# Patient Record
Sex: Male | Born: 1960
Health system: Southern US, Community
[De-identification: ages and names within clinical notes are randomized; demographics above are authoritative.]

## PROBLEM LIST (undated history)

## (undated) DIAGNOSIS — K635 Polyp of colon: Secondary | ICD-10-CM

## (undated) DIAGNOSIS — K219 Gastro-esophageal reflux disease without esophagitis: Secondary | ICD-10-CM

## (undated) DIAGNOSIS — E785 Hyperlipidemia, unspecified: Secondary | ICD-10-CM

## (undated) DIAGNOSIS — M1990 Unspecified osteoarthritis, unspecified site: Secondary | ICD-10-CM

## (undated) DIAGNOSIS — N4340 Spermatocele of epididymis, unspecified: Secondary | ICD-10-CM

## (undated) DIAGNOSIS — T7840XA Allergy, unspecified, initial encounter: Secondary | ICD-10-CM

## (undated) DIAGNOSIS — K222 Esophageal obstruction: Secondary | ICD-10-CM

## (undated) DIAGNOSIS — K5790 Diverticulosis of intestine, part unspecified, without perforation or abscess without bleeding: Secondary | ICD-10-CM

## (undated) DIAGNOSIS — N400 Enlarged prostate without lower urinary tract symptoms: Secondary | ICD-10-CM

## (undated) DIAGNOSIS — R011 Cardiac murmur, unspecified: Secondary | ICD-10-CM

## (undated) DIAGNOSIS — E119 Type 2 diabetes mellitus without complications: Secondary | ICD-10-CM

## (undated) DIAGNOSIS — G510 Bell's palsy: Secondary | ICD-10-CM

## (undated) DIAGNOSIS — K648 Other hemorrhoids: Secondary | ICD-10-CM

## (undated) DIAGNOSIS — G4730 Sleep apnea, unspecified: Secondary | ICD-10-CM

## (undated) HISTORY — DX: Bell's palsy: G51.0

## (undated) HISTORY — DX: Cardiac murmur, unspecified: R01.1

## (undated) HISTORY — DX: Allergy, unspecified, initial encounter: T78.40XA

## (undated) HISTORY — PX: UPPER GASTROINTESTINAL ENDOSCOPY: SHX188

## (undated) HISTORY — PX: COLONOSCOPY: SHX174

## (undated) HISTORY — DX: Unspecified osteoarthritis, unspecified site: M19.90

## (undated) HISTORY — DX: Benign prostatic hyperplasia without lower urinary tract symptoms: N40.0

## (undated) HISTORY — DX: Diverticulosis of intestine, part unspecified, without perforation or abscess without bleeding: K57.90

## (undated) HISTORY — DX: Hyperlipidemia, unspecified: E78.5

## (undated) HISTORY — DX: Spermatocele of epididymis, unspecified: N43.40

## (undated) HISTORY — DX: Esophageal obstruction: K22.2

## (undated) HISTORY — DX: Polyp of colon: K63.5

## (undated) HISTORY — DX: Other hemorrhoids: K64.8

## (undated) HISTORY — DX: Gastro-esophageal reflux disease without esophagitis: K21.9

## (undated) HISTORY — DX: Type 2 diabetes mellitus without complications: E11.9

## (undated) HISTORY — DX: Sleep apnea, unspecified: G47.30

---

## 1988-06-20 HISTORY — PX: CYSTECTOMY: SUR359

## 1989-04-20 DIAGNOSIS — E785 Hyperlipidemia, unspecified: Secondary | ICD-10-CM

## 1989-04-20 HISTORY — DX: Hyperlipidemia, unspecified: E78.5

## 1994-12-19 HISTORY — PX: OTHER SURGICAL HISTORY: SHX169

## 1997-07-21 DIAGNOSIS — N400 Enlarged prostate without lower urinary tract symptoms: Secondary | ICD-10-CM

## 1997-07-21 HISTORY — DX: Benign prostatic hyperplasia without lower urinary tract symptoms: N40.0

## 1997-08-05 ENCOUNTER — Encounter: Payer: Self-pay | Admitting: Family Medicine

## 1997-08-05 LAB — CONVERTED CEMR LAB: PSA: 0.6 ng/mL

## 2001-02-14 ENCOUNTER — Encounter: Payer: Self-pay | Admitting: Family Medicine

## 2002-11-19 DIAGNOSIS — K219 Gastro-esophageal reflux disease without esophagitis: Secondary | ICD-10-CM

## 2002-11-19 HISTORY — DX: Gastro-esophageal reflux disease without esophagitis: K21.9

## 2002-12-27 ENCOUNTER — Encounter: Payer: Self-pay | Admitting: Family Medicine

## 2003-01-19 DIAGNOSIS — E119 Type 2 diabetes mellitus without complications: Secondary | ICD-10-CM

## 2003-01-19 HISTORY — DX: Type 2 diabetes mellitus without complications: E11.9

## 2003-01-27 ENCOUNTER — Encounter: Payer: Self-pay | Admitting: Family Medicine

## 2003-12-30 ENCOUNTER — Encounter: Payer: Self-pay | Admitting: Family Medicine

## 2004-01-02 ENCOUNTER — Observation Stay (HOSPITAL_COMMUNITY): Admission: RE | Admit: 2004-01-02 | Discharge: 2004-01-03 | Payer: Self-pay | Admitting: General Surgery

## 2004-01-02 ENCOUNTER — Encounter (INDEPENDENT_AMBULATORY_CARE_PROVIDER_SITE_OTHER): Payer: Self-pay | Admitting: Specialist

## 2004-01-05 HISTORY — PX: CHOLECYSTECTOMY: SHX55

## 2004-01-13 HISTORY — PX: DOPPLER ECHOCARDIOGRAPHY: SHX263

## 2004-06-29 ENCOUNTER — Ambulatory Visit: Payer: Self-pay | Admitting: Family Medicine

## 2004-07-01 ENCOUNTER — Ambulatory Visit: Payer: Self-pay | Admitting: Family Medicine

## 2004-11-30 ENCOUNTER — Ambulatory Visit: Payer: Self-pay | Admitting: Internal Medicine

## 2004-12-28 ENCOUNTER — Ambulatory Visit: Payer: Self-pay | Admitting: Family Medicine

## 2004-12-28 LAB — CONVERTED CEMR LAB
Hgb A1c MFr Bld: 6.1 %
Microalbumin U total vol: 2.6 mg/L

## 2004-12-30 ENCOUNTER — Ambulatory Visit: Payer: Self-pay | Admitting: Family Medicine

## 2005-05-09 ENCOUNTER — Ambulatory Visit: Payer: Self-pay | Admitting: Family Medicine

## 2005-11-29 ENCOUNTER — Ambulatory Visit: Payer: Self-pay | Admitting: Internal Medicine

## 2006-04-17 ENCOUNTER — Ambulatory Visit: Payer: Self-pay | Admitting: Family Medicine

## 2006-04-17 LAB — CONVERTED CEMR LAB
Hgb A1c MFr Bld: 6.3 %
Microalbumin U total vol: 4.3 mg/L

## 2006-04-26 ENCOUNTER — Ambulatory Visit: Payer: Self-pay | Admitting: Family Medicine

## 2006-05-01 ENCOUNTER — Ambulatory Visit: Payer: Self-pay | Admitting: Family Medicine

## 2006-05-19 ENCOUNTER — Ambulatory Visit: Payer: Self-pay | Admitting: Family Medicine

## 2006-05-26 ENCOUNTER — Ambulatory Visit: Payer: Self-pay | Admitting: Family Medicine

## 2006-05-26 ENCOUNTER — Encounter (INDEPENDENT_AMBULATORY_CARE_PROVIDER_SITE_OTHER): Payer: Self-pay | Admitting: Specialist

## 2006-05-26 HISTORY — PX: VASECTOMY: SHX75

## 2006-06-07 ENCOUNTER — Ambulatory Visit: Payer: Self-pay | Admitting: Family Medicine

## 2006-07-25 ENCOUNTER — Ambulatory Visit: Payer: Self-pay | Admitting: Family Medicine

## 2006-07-25 LAB — CONVERTED CEMR LAB
ALT: 37 units/L (ref 0–40)
AST: 30 units/L (ref 0–37)
Cholesterol: 127 mg/dL (ref 0–200)
HDL: 27 mg/dL — ABNORMAL LOW (ref >39.0)
Hgb A1c MFr Bld: 6.3 %
Hgb A1c MFr Bld: 6.3 % — ABNORMAL HIGH (ref 4.6–6.0)
LDL Cholesterol: 81 mg/dL (ref 0–99)
Total CHOL/HDL Ratio: 4.7
Triglycerides: 95 mg/dL (ref 0–149)
VLDL: 19 mg/dL (ref 0–40)

## 2006-07-27 ENCOUNTER — Ambulatory Visit: Payer: Self-pay | Admitting: Family Medicine

## 2006-10-03 ENCOUNTER — Ambulatory Visit: Payer: Self-pay | Admitting: Family Medicine

## 2006-11-20 ENCOUNTER — Encounter: Payer: Self-pay | Admitting: Family Medicine

## 2006-11-20 DIAGNOSIS — G4733 Obstructive sleep apnea (adult) (pediatric): Secondary | ICD-10-CM | POA: Insufficient documentation

## 2006-11-20 DIAGNOSIS — K219 Gastro-esophageal reflux disease without esophagitis: Secondary | ICD-10-CM | POA: Insufficient documentation

## 2006-11-20 DIAGNOSIS — R011 Cardiac murmur, unspecified: Secondary | ICD-10-CM

## 2006-11-20 DIAGNOSIS — N400 Enlarged prostate without lower urinary tract symptoms: Secondary | ICD-10-CM

## 2006-11-20 DIAGNOSIS — N4340 Spermatocele of epididymis, unspecified: Secondary | ICD-10-CM

## 2006-11-20 DIAGNOSIS — E119 Type 2 diabetes mellitus without complications: Secondary | ICD-10-CM | POA: Insufficient documentation

## 2006-11-20 DIAGNOSIS — E1129 Type 2 diabetes mellitus with other diabetic kidney complication: Secondary | ICD-10-CM | POA: Insufficient documentation

## 2006-11-20 DIAGNOSIS — E785 Hyperlipidemia, unspecified: Secondary | ICD-10-CM | POA: Insufficient documentation

## 2006-11-21 ENCOUNTER — Ambulatory Visit: Payer: Self-pay | Admitting: Family Medicine

## 2007-03-13 ENCOUNTER — Ambulatory Visit: Payer: Self-pay | Admitting: Internal Medicine

## 2007-05-11 ENCOUNTER — Ambulatory Visit: Payer: Self-pay | Admitting: Family Medicine

## 2007-05-12 LAB — CONVERTED CEMR LAB
Chloride: 104 meq/L (ref 96–112)
Cholesterol: 141 mg/dL (ref 0–200)
GFR calc Af Amer: 92 mL/min
GFR calc non Af Amer: 76 mL/min
Glucose, Bld: 159 mg/dL — ABNORMAL HIGH (ref 70–99)
HDL: 22.4 mg/dL — ABNORMAL LOW (ref >39.0)
Hgb A1c MFr Bld: 6.5 % — ABNORMAL HIGH (ref 4.6–6.0)
LDL Cholesterol: 89 mg/dL (ref 0–99)
PSA: 1.09 ng/mL (ref 0.10–4.00)
Potassium: 4.7 meq/L (ref 3.5–5.1)
Sodium: 140 meq/L (ref 135–145)
TSH: 2.31 microintl units/mL (ref 0.35–5.50)
Total CHOL/HDL Ratio: 6.3
Triglycerides: 149 mg/dL (ref 0–149)

## 2007-05-15 ENCOUNTER — Ambulatory Visit: Payer: Self-pay | Admitting: Family Medicine

## 2007-11-09 ENCOUNTER — Ambulatory Visit: Payer: Self-pay | Admitting: Family Medicine

## 2007-11-09 LAB — CONVERTED CEMR LAB: Hgb A1c MFr Bld: 6.8 % — ABNORMAL HIGH (ref 4.6–6.0)

## 2007-11-13 ENCOUNTER — Ambulatory Visit: Payer: Self-pay | Admitting: Family Medicine

## 2008-03-11 ENCOUNTER — Ambulatory Visit: Payer: Self-pay | Admitting: Internal Medicine

## 2008-04-30 ENCOUNTER — Ambulatory Visit: Payer: Self-pay | Admitting: Family Medicine

## 2008-04-30 LAB — CONVERTED CEMR LAB
Albumin: 3.9 g/dL (ref 3.5–5.2)
BUN: 13 mg/dL (ref 6–23)
Calcium: 9 mg/dL (ref 8.4–10.5)
Creatinine,U: 296 mg/dL
Eosinophils Relative: 2.1 % (ref 0.0–5.0)
GFR calc Af Amer: 83 mL/min
Glucose, Bld: 170 mg/dL — ABNORMAL HIGH (ref 70–99)
HCT: 44.1 % (ref 39.0–52.0)
Hemoglobin: 15 g/dL (ref 13.0–17.0)
MCV: 88.2 fL (ref 78.0–100.0)
Microalb Creat Ratio: 9.8 mg/g (ref 0.0–30.0)
Microalb, Ur: 2.9 mg/dL — ABNORMAL HIGH (ref 0.0–1.9)
Monocytes Absolute: 0.3 10*3/uL (ref 0.1–1.0)
Monocytes Relative: 6.2 % (ref 3.0–12.0)
Neutro Abs: 3.3 10*3/uL (ref 1.4–7.7)
RDW: 12.3 % (ref 11.5–14.6)
Sodium: 139 meq/L (ref 135–145)
Total CHOL/HDL Ratio: 6.3
Total Protein: 6.7 g/dL (ref 6.0–8.3)
Triglycerides: 96 mg/dL (ref 0–149)

## 2008-05-19 ENCOUNTER — Ambulatory Visit: Payer: Self-pay | Admitting: Family Medicine

## 2008-08-15 ENCOUNTER — Ambulatory Visit: Payer: Self-pay | Admitting: Family Medicine

## 2008-08-25 ENCOUNTER — Ambulatory Visit: Payer: Self-pay | Admitting: Family Medicine

## 2008-08-25 DIAGNOSIS — H8110 Benign paroxysmal vertigo, unspecified ear: Secondary | ICD-10-CM

## 2008-10-07 ENCOUNTER — Encounter: Payer: Self-pay | Admitting: Internal Medicine

## 2009-01-28 ENCOUNTER — Ambulatory Visit: Payer: Self-pay | Admitting: Family Medicine

## 2009-02-02 ENCOUNTER — Ambulatory Visit: Payer: Self-pay | Admitting: Family Medicine

## 2009-03-10 ENCOUNTER — Ambulatory Visit: Payer: Self-pay | Admitting: Internal Medicine

## 2009-04-06 ENCOUNTER — Telehealth: Payer: Self-pay | Admitting: Family Medicine

## 2009-06-17 ENCOUNTER — Encounter: Payer: Self-pay | Admitting: Family Medicine

## 2009-06-17 LAB — CONVERTED CEMR LAB
Bilirubin, Direct: 0.1 mg/dL (ref 0.0–0.3)
CO2: 28 meq/L (ref 19–32)
Calcium: 9.2 mg/dL (ref 8.4–10.5)
Creatinine, Ser: 1 mg/dL (ref 0.4–1.5)
HDL: 24.2 mg/dL — ABNORMAL LOW (ref >39.00)
Hgb A1c MFr Bld: 7.1 % — ABNORMAL HIGH (ref 4.6–6.5)
LDL Cholesterol: 92 mg/dL (ref 0–99)
Microalb, Ur: 2.3 mg/dL — ABNORMAL HIGH (ref 0.0–1.9)
PSA: 1.58 ng/mL (ref 0.10–4.00)
Total Bilirubin: 0.7 mg/dL (ref 0.3–1.2)
Total CHOL/HDL Ratio: 6
Total Protein: 6.9 g/dL (ref 6.0–8.3)
Triglycerides: 134 mg/dL (ref 0.0–149.0)

## 2009-06-22 ENCOUNTER — Ambulatory Visit: Payer: Self-pay | Admitting: Family Medicine

## 2009-07-10 ENCOUNTER — Ambulatory Visit: Payer: Self-pay | Admitting: Family Medicine

## 2009-07-13 ENCOUNTER — Encounter (INDEPENDENT_AMBULATORY_CARE_PROVIDER_SITE_OTHER): Payer: Self-pay | Admitting: *Deleted

## 2009-12-09 ENCOUNTER — Ambulatory Visit: Payer: Self-pay | Admitting: Family Medicine

## 2009-12-09 LAB — CONVERTED CEMR LAB: Hgb A1c MFr Bld: 7.5 % — ABNORMAL HIGH (ref 4.6–6.5)

## 2009-12-14 ENCOUNTER — Ambulatory Visit: Payer: Self-pay | Admitting: Family Medicine

## 2010-01-26 ENCOUNTER — Encounter (INDEPENDENT_AMBULATORY_CARE_PROVIDER_SITE_OTHER): Payer: Self-pay | Admitting: *Deleted

## 2010-03-10 ENCOUNTER — Telehealth: Payer: Self-pay | Admitting: Internal Medicine

## 2010-04-02 ENCOUNTER — Ambulatory Visit: Payer: Self-pay | Admitting: Internal Medicine

## 2010-06-28 ENCOUNTER — Telehealth: Payer: Self-pay | Admitting: Internal Medicine

## 2010-06-29 ENCOUNTER — Ambulatory Visit
Admission: RE | Admit: 2010-06-29 | Discharge: 2010-06-29 | Payer: Self-pay | Source: Home / Self Care | Attending: Family Medicine | Admitting: Family Medicine

## 2010-06-29 ENCOUNTER — Other Ambulatory Visit: Payer: Self-pay | Admitting: Family Medicine

## 2010-06-29 LAB — CBC WITH DIFFERENTIAL/PLATELET
Basophils Absolute: 0 10*3/uL (ref 0.0–0.1)
Basophils Relative: 0.3 % (ref 0.0–3.0)
Eosinophils Absolute: 0.1 10*3/uL (ref 0.0–0.7)
Eosinophils Relative: 2.1 % (ref 0.0–5.0)
HCT: 43.6 % (ref 39.0–52.0)
Hemoglobin: 14.8 g/dL (ref 13.0–17.0)
Lymphocytes Relative: 36.6 % (ref 12.0–46.0)
Lymphs Abs: 2.2 10*3/uL (ref 0.7–4.0)
MCHC: 34.1 g/dL (ref 30.0–36.0)
MCV: 89.5 fl (ref 78.0–100.0)
Monocytes Absolute: 0.4 10*3/uL (ref 0.1–1.0)
Monocytes Relative: 6 % (ref 3.0–12.0)
Neutro Abs: 3.3 10*3/uL (ref 1.4–7.7)
Neutrophils Relative %: 55 % (ref 43.0–77.0)
Platelets: 167 10*3/uL (ref 150.0–400.0)
RBC: 4.87 Mil/uL (ref 4.22–5.81)
RDW: 13.4 % (ref 11.5–14.6)
WBC: 5.9 10*3/uL (ref 4.5–10.5)

## 2010-06-29 LAB — BASIC METABOLIC PANEL
BUN: 14 mg/dL (ref 6–23)
CO2: 31 mEq/L (ref 19–32)
Calcium: 9.6 mg/dL (ref 8.4–10.5)
Chloride: 102 mEq/L (ref 96–112)
Creatinine, Ser: 1 mg/dL (ref 0.4–1.5)
GFR: 80.79 mL/min (ref >60.00)
Glucose, Bld: 171 mg/dL — ABNORMAL HIGH (ref 70–99)
Potassium: 4.7 mEq/L (ref 3.5–5.1)
Sodium: 139 mEq/L (ref 135–145)

## 2010-06-29 LAB — LIPID PANEL
Cholesterol: 147 mg/dL (ref 0–200)
HDL: 26.6 mg/dL — ABNORMAL LOW (ref >39.00)
LDL Cholesterol: 91 mg/dL (ref 0–99)
Total CHOL/HDL Ratio: 6
Triglycerides: 145 mg/dL (ref 0.0–149.0)
VLDL: 29 mg/dL (ref 0.0–40.0)

## 2010-06-29 LAB — MICROALBUMIN / CREATININE URINE RATIO
Creatinine,U: 254.2 mg/dL
Microalb Creat Ratio: 0.9 mg/g (ref 0.0–30.0)
Microalb, Ur: 2.2 mg/dL — ABNORMAL HIGH (ref 0.0–1.9)

## 2010-06-29 LAB — HEMOGLOBIN A1C: Hgb A1c MFr Bld: 7.7 % — ABNORMAL HIGH (ref 4.6–6.5)

## 2010-06-29 LAB — HEPATIC FUNCTION PANEL
ALT: 49 U/L (ref 0–53)
AST: 40 U/L — ABNORMAL HIGH (ref 0–37)
Albumin: 4.1 g/dL (ref 3.5–5.2)
Alkaline Phosphatase: 51 U/L (ref 39–117)
Bilirubin, Direct: 0.1 mg/dL (ref 0.0–0.3)
Total Bilirubin: 0.8 mg/dL (ref 0.3–1.2)
Total Protein: 7 g/dL (ref 6.0–8.3)

## 2010-06-29 LAB — PSA: PSA: 0.75 ng/mL (ref 0.10–4.00)

## 2010-06-29 LAB — TSH: TSH: 3.02 u[IU]/mL (ref 0.35–5.50)

## 2010-07-07 ENCOUNTER — Ambulatory Visit
Admission: RE | Admit: 2010-07-07 | Discharge: 2010-07-07 | Payer: Self-pay | Source: Home / Self Care | Attending: Family Medicine | Admitting: Family Medicine

## 2010-07-07 DIAGNOSIS — M25569 Pain in unspecified knee: Secondary | ICD-10-CM | POA: Insufficient documentation

## 2010-07-07 LAB — HM DIABETES FOOT EXAM

## 2010-07-22 NOTE — Letter (Signed)
Summary: Results Follow up Letter  [place] [facility]  [address] [place], [place] [address]   Phone: [phone]  Fax: [phone]    07/13/2009 MRN: [id number]     ETHER WOLTERS [address], [place]  [address]    Dear Mr. Kowalski,  The following are the results of your recent test(s):  Test         Result    Pap Smear:        Normal _____  Not Normal _____ Comments: ______________________________________________________ Cholesterol: LDL(Bad cholesterol):         Your goal is less than:         HDL (Good cholesterol):       Your goal is more than: Comments:  ______________________________________________________ Mammogram:        Normal _____  Not Normal _____ Comments:  ___________________________________________________________________ Hemoccult:        Normal __x___  Not normal _______ Comments:  Follow up in 1 year  _____________________________________________________________________ Other Tests:    We routinely do not discuss normal results over the telephone.  If you desire a copy of the results, or you have any questions about this information we can discuss them at your next office visit.   Sincerely,  Laurita Quint MD

## 2010-07-22 NOTE — Assessment & Plan Note (Signed)
Summary: 6 MONTH FOLLOW UP/[facility]   Vital Signs:  Patient profile:   50 year old male Weight:      247.75 pounds Temp:     98.8 degrees F oral Pulse rate:   84 / minute Pulse rhythm:   regular BP sitting:   112 / 68  (left arm) Cuff size:   large  Vitals Entered By: Sydell Axon LPN (December 14, 2009 3:20 PM) CC: 6 Month follow-up, would like a refill on Flexeril   History of Present Illness: Pt here for 6 month followup. He has a lot of stress and finds it hard to be a good diabetic dietwise. He also has frequent posterior neck pain bilat at night with spasms frequently that his wife kneads.  He otherwise is doing well.  Problems Prior to Update: 1)  Special Screening Malig Neoplasms Other Sites  (ICD-V76.49) 2)  Benign Positional Vertigo  (ICD-386.11) 3)  Health Maintenance Exam  (ICD-V70.0) 4)  Special Screening Malignant Neoplasm of Prostate  (ICD-V76.44) 5)  Lumbosacral Strain  (ICD-846.0) 6)  Heart Murmur, Benign  (ICD-785.2) 7)  Sleep Apnea  (ICD-780.57) 8)  Spermatocele, Left  (ICD-608.1) 9)  Otitis Externa, Acute, Left Recurrent  (ICD-380.22) 10)  Benign Prostatic Hypertrophy  (ICD-600.00) 11)  Hyperlipidemia  (ICD-272.4) 12)  Gerd  (ICD-530.81) 13)  Diabetes Mellitus, Type II  (ICD-250.00)  Medications Prior to Update: 1)  Pravastatin Sodium 80 Mg Tabs (Pravastatin Sodium) .... Take 1 Tablet At Bedtime 2)  Fish Oil 1000 Mg Caps (Omega-3 Fatty Acids) .Marland Kitchen.. 1 Twice A Day By Mouth 3)  Osteo Bi-Flex Adv Triple St  Tabs (Misc Natural Products) .Marland Kitchen.. 1 Daily By Mouth As Needed 4)  Flexeril 10 Mg Tabs (Cyclobenzaprine Hcl) .... One Tab By Mouth Three Times A Day As Needed 5)  Hemorrhoidal-Hc 25 Mg  Supp (Hydrocortisone Acetate) .... Use Suppositories Three Times A Day As Needed 6)  Proctofoam Hc 1-1 %  Foam (Hydrocortisone Ace-Pramoxine) .... Use As Dir 7)  Cpap 12 Advanced 8)  Temazepam 15 Mg Caps (Temazepam) .Marland Kitchen.. 1 or 2 For Sleep As Needed 9)  Metformin Hcl 500 Mg Tabs  (Metformin Hcl) .... One Tab By Mouth At Night. 10)  Zantac 150 Mg Caps (Ranitidine Hcl) .... One Tab By Mouth Two Times A Day 11)  Meclizine Hcl 25 Mg Tabs (Meclizine Hcl) .... One Tab By Mouth Every 6 Hours As Needed For Vertigo 12)  Zaleplon 10 Mg Caps (Zaleplon) .Marland Kitchen.. 1 For Sleep If Needed  Allergies: No Known Drug Allergies  Physical Exam  General:  Well-developed,well-nourished,in no acute distress; alert,appropriate and cooperative throughout examination, mildly more obese. Head:  Normocephalic and atraumatic without obvious abnormalities. No apparent alopecia or balding. Eyes:  Conjunctiva clear bilaterally.  Ears:  External ear exam shows no significant lesions or deformities.  Otoscopic examination reveals clear canals, tympanic membranes are intact bilaterally without bulging, retraction, inflammation or discharge. Hearing is grossly normal bilaterally. Nose:  External nasal examination shows no deformity or inflammation. Nasal mucosa are pink and moist without lesions or exudates. Mouth:  Oral mucosa and oropharynx without lesions or exudates.  Teeth in good repair. Neck:  No deformities, masses, or tenderness noted. Lungs:  Normal respiratory effort, chest expands symmetrically. Lungs are clear to auscultation, no crackles or wheezes. Heart:  Normal rate and regular rhythm. S1 and S2 normal without gallop, murmur, click, rub or other extra sounds.   Impression & Recommendations:  Problem # 1:  DIABETES MELLITUS, TYPE II (ICD-250.00) Assessment  Deteriorated  A1C again more elevated. Tried to encourage better diet.  Increase Metformin. His updated medication list for this problem includes:    Metformin Hcl 500 Mg Tabs (Metformin hcl) ..... One tab by mouth in am and two at night.  Labs Reviewed: Creat: 1.0 (06/17/2009)   Microalbumin: 4.3 (04/17/2006) Reviewed HgBA1c results: 7.5 (12/09/2009)  7.1 (06/17/2009)  Problem # 2:  CERVICAL MUSCLE STRAIN  (ICD-847.0) Assessment: New  Use heat and ice. Flexeril script sent in.  Discussed a few techniques at relaxation. His updated medication list for this problem includes:    Flexeril 10 Mg Tabs (Cyclobenzaprine hcl) ..... One tab by mouth three times a day as needed  Discussed exercises and use of moist heat or cold and medication.   Complete Medication List: 1)  Pravastatin Sodium 80 Mg Tabs (Pravastatin sodium) .... Take 1 tablet at bedtime 2)  Fish Oil 1000 Mg Caps (Omega-3 fatty acids) .Marland Kitchen.. 1 twice a day by mouth 3)  Osteo Bi-flex Adv Triple St Tabs (Misc natural products) .Marland Kitchen.. 1 daily by mouth as needed 4)  Flexeril 10 Mg Tabs (Cyclobenzaprine hcl) .... One tab by mouth three times a day as needed 5)  Hemorrhoidal-hc 25 Mg Supp (Hydrocortisone acetate) .... Use suppositories three times a day as needed 6)  Proctofoam Hc 1-1 % Foam (Hydrocortisone ace-pramoxine) .... Use as dir 7)  Cpap 12 Advanced  8)  Temazepam 15 Mg Caps (Temazepam) .Marland Kitchen.. 1 or 2 for sleep as needed 9)  Metformin Hcl 500 Mg Tabs (Metformin hcl) .... One tab by mouth in am and two at night. 10)  Zantac 150 Mg Caps (Ranitidine hcl) .... One tab by mouth two times a day 11)  Meclizine Hcl 25 Mg Tabs (Meclizine hcl) .... One tab by mouth every 6 hours as needed for vertigo 12)  Zaleplon 10 Mg Caps (Zaleplon) .Marland Kitchen.. 1 for sleep if needed  Patient Instructions: 1)  RTC 1/12 for Comp Exam, labs prior, sooner as needed. Prescriptions: FLEXERIL 10 MG TABS (CYCLOBENZAPRINE HCL) one tab by mouth three times a day as needed  #60 x 3   Entered and Authorized by:   Shaune Leeks MD   Signed by:   Shaune Leeks MD on 12/14/2009   Method used:   Electronically to        [facility]  [address] (retail)       [address]       [place], [place]  [address]       Ph: [phone] or [phone]       Fax: [phone]   RxID:   [id number] METFORMIN HCL 500 MG TABS (METFORMIN HCL) one tab by mouth in AM  and two at night.  #90 x 12   Entered and Authorized by:   Shaune Leeks MD   Signed by:   Shaune Leeks MD on 12/14/2009   Method used:   [facility]        [facility]  [address] (retail)       [address]       [place], [place]  [address]       Ph: [phone] or [phone]       Fax: [phone]   RxID:   [id number]   Current Allergies (reviewed today): No known allergies

## 2010-07-22 NOTE — Letter (Signed)
Summary: [name] letter  [place] at [facility]  [address] [place], [place] [address]   Phone: [phone]  Fax: [phone]       01/26/2010 MRN: [id number]  Edward Wright [address] [place]  [address]  Dear [name] [address], and [place] announce the retirement of Arta Silence, M.D., from full-time practice at the [facility] office effective December 17, 2009 and his plans of returning part-time.  It is important to Dr. Hetty Ely and to our practice that you understand that [facility] has seven physicians in our office for your health care needs.  We will continue to offer the same exceptional care that you have today.    Dr. Hetty Ely has spoken to many of you about his plans for retirement and returning part-time in the fall.   We will continue to work with you through the transition to schedule appointments for you in the office and meet the high standards that [place] is committed to.   Again, it is with great pleasure that we share the news that Dr. Hetty Ely will return to [facility] at [facility] in October of 2011 with a reduced schedule.    If you have any questions, or would like to request an appointment with one of our physicians, please call us at [phone] and press the option for Scheduling an appointment.  We take pleasure in providing you with excellent patient care and look forward to seeing you at your next office visit.  Our [facility] Physicians are:  Tillman Abide, M.D. Laurita Quint, M.D. Roxy Manns, M.D. Kerby Nora, M.D. Hannah Beat, M.D. Ruthe Mannan, M.D. We proudly welcomed Raechel Ache, M.D. and Eustaquio Boyden, M.D. to the practice in July/August 2011.  Sincerely,  [facility]

## 2010-07-22 NOTE — Assessment & Plan Note (Signed)
Summary: ROV/ MBW   Primary Anakaren Campion/Referring Veronica Guerrant:  Rene Kocher  CC:  Follow up visit-sleep apnea; using CPAP each night but needs pressure increased; refills on Temazepam and Zaleplon.  History of Present Illness: History of Present Illness: 03/11/09- History of Present Illness: 50 year old man returning for follow-up of sleep apnea.  Had failed an oral appliance.  Trying now to adjust to a new mattress.  Says he no longer has backache, but no "get up and go".Doesn't want to get up in the morning. Continues CPAP at 11CWP used all night every night.  Nasal mask and humidifier are comfortable. Some days he fights daytime sleepiness.  Modest use of caffeine.  No history of thyroid disease. Bedtime 1030 to 11 p.m., up 6:30 a.m.  On weekends he stays up till midnight and gets up at 8 a.m.  Rarely naps.  03/10/09- OSA Continues CPAP 11. Comfortable. He is beginning to wake during night more. He stays awake some nights. He sleeps, but isn't feeling as well rested anymore. Occasionally will use Temazepam,  but with a little caution that it may linger too long in his system in the morning. Occasionally wife mentions he is snoring through the mask. 8 lb weight gain in past year.  April 02, 2010- OSA Continues CPAP all night every night at 12. Advanced . He would like to try it set on 13.  We discussed on-line suppliers.  He needs refills on sleep meds as discussed.  Incidental cold.   Preventive Screening-Counseling & Management  Alcohol-Tobacco     Alcohol drinks/day: rare     Alcohol type: mixed     Smoking Status: quit     Packs/Day: 1995     Year Quit: 1995      Pack years: 16     Passive Smoke Exposure: yes  Current Medications (verified): 1)  Pravastatin Sodium 80 Mg Tabs (Pravastatin Sodium) .... Take 1 Tablet At Bedtime 2)  Fish Oil 1000 Mg Caps (Omega-3 Fatty Acids) .Marland Kitchen.. 1 Twice A Day By Mouth 3)  Osteo Bi-Flex Adv Triple St  Tabs (Misc Natural Products) .Marland Kitchen.. 1 Daily By  Mouth As Needed 4)  Flexeril 10 Mg Tabs (Cyclobenzaprine Hcl) .... One Tab By Mouth Three Times A Day As Needed 5)  Hemorrhoidal-Hc 25 Mg  Supp (Hydrocortisone Acetate) .... Use Suppositories Three Times A Day As Needed 6)  Proctofoam Hc 1-1 %  Foam (Hydrocortisone Ace-Pramoxine) .... Use As Dir 7)  Cpap 12 Advanced 8)  Temazepam 15 Mg Caps (Temazepam) .Marland Kitchen.. 1 or 2 For Sleep As Needed 9)  Metformin Hcl 500 Mg Tabs (Metformin Hcl) .... One Tab By Mouth in Am and Two At Night. 10)  Zantac 150 Mg Caps (Ranitidine Hcl) .... One Tab By Mouth Two Times A Day 11)  Meclizine Hcl 25 Mg Tabs (Meclizine Hcl) .... One Tab By Mouth Every 6 Hours As Needed For Vertigo 12)  Zaleplon 10 Mg Caps (Zaleplon) .Marland Kitchen.. 1 For Sleep If Needed  Allergies (verified): No Known Drug Allergies  Past History:  Past Medical History: Last updated: 03/11/2008 Diabetes mellitus, type II: 01/2003 GERD: 11/2002 Hyperlipidemia: 04/1989 Benign prostatic hypertrophy: 07/1997 Sleep Apnea  Past Surgical History: Last updated: 11/20/2006 SLEEP STUDY POS ON CPAP HEMTURIA W/U NEG (UROL) 12/1994 GANGLION CYSTECTOMY R WRIST ECHO: NORMAL:(01/13/2004): CHOLECYSTECOMY : (01/05/2004) VASECTOMY : (05/26/2006)  Family History: Last updated: Jul 17, 2009 Father: DECEASED 60YOA, MULTIPLE MYELOMA  Mother: A 75 ,HTN,CAD, MI, STENTS X2-3 Siblings: 3 SISTERS CV: + PATERNAL SIDE BP: + MOTHER  DIABETES: NEGATIVE CANCER: + FATHER MULTIPLE MYELOMA/ AUNTS UNCLES OTHER: + STROKE MOTHER MINI  Social History: Last updated: 11/20/2006 Marital Status: Married LIVES WITH WIFE Children: 2 CHILDREN Occupation: A/C CONTRACTOR/ COMPUTER OPERATOR, ESTIMATOR  Risk Factors: Alcohol Use: rare (04/02/2010) Caffeine Use: 1 (06/22/2009) Exercise: no (06/22/2009)  Risk Factors: Smoking Status: quit (04/02/2010) Packs/Day: 1995 (04/02/2010) Passive Smoke Exposure: yes (04/02/2010)  Review of Systems      See HPI  The patient denies anorexia,  fever, weight loss, weight gain, vision loss, decreased hearing, hoarseness, chest pain, syncope, dyspnea on exertion, peripheral edema, prolonged cough, headaches, hemoptysis, abdominal pain, severe indigestion/heartburn, muscle weakness, and enlarged lymph nodes.    Vital Signs:  Patient profile:   50 year old male Height:      71 inches Weight:      246.25 pounds BMI:     34.47 O2 Sat:      95 % on Room air Pulse rate:   92 / minute BP sitting:   124 / 78  (left arm) Cuff size:   regular  Vitals Entered By: Reynaldo Minium CMA (April 02, 2010 4:17 PM)  O2 Flow:  Room air CC: Follow up visit-sleep apnea; using CPAP each night but needs pressure increased; refills on Temazepam and Zaleplon   Physical Exam  Additional Exam:  General: A/Ox3; pleasant and cooperative, NAD, overweight SKIN: no rash, lesions NODES: no lymphadenopathy HEENT: [place]/AT, EOM- WNL, Conjuctivae- clear, PERRLA, TM-WNL, Nose- clear, Throat- clear and wnl, Mallampati III, long palate, hoarse NECK: Supple w/ fair ROM, JVD- none, normal carotid impulses w/o bruits Thyroid- normal to palpation CHEST: Clear to P&A HEART: RRR, no m/g/r heard ABDOMEN: overweight ACZ:YSAY, nl pulses, no edema  NEURO: Grossly intact to observation      Impression & Recommendations:  Problem # 1:  SLEEP APNEA (ICD-780.57) We will let him try increasing pressure to 13 at his request.   Medications Added to Medication List This Visit: 1)  Cpap 13 Advanced   Other Orders: Est. Patient Level III (30160) DME Referral (DME)  Patient Instructions: 1)  Please schedule a follow-up appointment in 1 year. 2)  We are asking Advanced to increase your cpap to 13 for trial . If it isn't comfortable let me know.  3)  Refil scripts for sleep meds.  Prescriptions: ZALEPLON 10 MG CAPS (ZALEPLON) 1 for sleep if needed  #30 x prn   Entered and Authorized by:   Waymon Budge MD   Signed by:   Waymon Budge MD on 04/02/2010   Method  used:   Print then Give to Patient   RxID:   [id number] TEMAZEPAM 15 MG CAPS (TEMAZEPAM) 1 or 2 for sleep as needed  #30 x prn   Entered and Authorized by:   Waymon Budge MD   Signed by:   Waymon Budge MD on 04/02/2010   Method used:   Print then Give to Patient   RxID:   [id number]    Immunization History:  Influenza Immunization History:    Influenza:  historical (03/26/2010)

## 2010-07-22 NOTE — Assessment & Plan Note (Signed)
Summary: CPX   Vital Signs:  Patient profile:   50 year old male Weight:      251.25 pounds BMI:     35.17 Temp:     98.3 degrees F oral Pulse rate:   80 / minute Pulse rhythm:   regular BP sitting:   110 / 80  (left arm) Cuff size:   large  Vitals Entered By: Linde Gillis CMA Duncan Dull) June 27, 2009 3:01 PM) CC: 30 minute exam   History of Present Illness: Pt here for Comp Exam having strressful day today. He has been having buzzing/vibration of the perineal area, happening off and on all day , started initially after intercourse but happens a number of times a day. It had been 1-2 weeks prior for next most recent activity. No fever or chilss, no unusual back pain.   Preventive Screening-Counseling & Management  Alcohol-Tobacco     Alcohol drinks/day: rare     Alcohol type: mixed     Smoking Status: quit     Packs/Day: 1995     Year Quit: 1995      Pack years: 16     Passive Smoke Exposure: yes  Caffeine-Diet-Exercise     Caffeine use/day: 1     Does Patient Exercise: no  Problems Prior to Update: 1)  Benign Positional Vertigo  (ICD-386.11) 2)  Health Maintenance Exam  (ICD-V70.0) 3)  Special Screening Malignant Neoplasm of Prostate  (ICD-V76.44) 4)  Lumbosacral Strain  (ICD-846.0) 5)  Heart Murmur, Benign  (ICD-785.2) 6)  Sleep Apnea  (ICD-780.57) 7)  Spermatocele, Left  (ICD-608.1) 8)  Otitis Externa, Acute, Left Recurrent  (ICD-380.22) 9)  Benign Prostatic Hypertrophy  (ICD-600.00) 10)  Hyperlipidemia  (ICD-272.4) 11)  Gerd  (ICD-530.81) 12)  Diabetes Mellitus, Type II  (ICD-250.00)  Medications Prior to Update: 1)  Pravastatin Sodium 80 Mg Tabs (Pravastatin Sodium) .... Take 1 Tablet At Bedtime 2)  Fish Oil 1000 Mg Caps (Omega-3 Fatty Acids) .Marland Kitchen.. 1 Twice A Day By Mouth 3)  Osteo Bi-Flex Adv Triple St  Tabs (Misc Natural Products) .Marland Kitchen.. 1 Daily By Mouth As Needed 4)  Flexeril 10 Mg Tabs (Cyclobenzaprine Hcl) .... One Tab By Mouth Three Times A Day As  Needed 5)  Hemorrhoidal-Hc 25 Mg  Supp (Hydrocortisone Acetate) .... Use Suppositories Three Times A Day As Needed 6)  Proctofoam Hc 1-1 %  Foam (Hydrocortisone Ace-Pramoxine) .... Use As Dir 7)  Cpap 12 Advanced 8)  Temazepam 15 Mg Caps (Temazepam) .Marland Kitchen.. 1 or 2 For Sleep As Needed 9)  Metformin Hcl 500 Mg Tabs (Metformin Hcl) .... One Tab By Mouth At Night. 10)  Zantac 150 Mg Caps (Ranitidine Hcl) .... One Tab By Mouth Two Times A Day 11)  Meclizine Hcl 25 Mg Tabs (Meclizine Hcl) .... One Tab By Mouth Every 6 Hours As Needed For Vertigo 12)  Zaleplon 10 Mg Caps (Zaleplon) .Marland Kitchen.. 1 For Sleep If Needed  Allergies (verified): No Known Drug Allergies  Past History:  Past Medical History: Last updated: 03/11/2008 Diabetes mellitus, type II: 01/2003 GERD: 11/2002 Hyperlipidemia: 04/1989 Benign prostatic hypertrophy: 07/1997 Sleep Apnea  Past Surgical History: Last updated: 11/20/2006 SLEEP STUDY POS ON CPAP HEMTURIA W/U NEG (UROL) 12/1994 GANGLION CYSTECTOMY R WRIST ECHO: NORMAL:(01/13/2004): CHOLECYSTECOMY : (01/05/2004) VASECTOMY : (05/26/2006)  Family History: Last updated: 2009/06/27 Father: DECEASED 60YOA, MULTIPLE MYELOMA  Mother: A 101 ,HTN,CAD, MI, STENTS X2-3 Siblings: 3 SISTERS CV: + PATERNAL SIDE BP: + MOTHER DIABETES: NEGATIVE CANCER: + FATHER MULTIPLE MYELOMA/  AUNTS UNCLES OTHER: + STROKE MOTHER MINI  Social History: Last updated: 11/20/2006 Marital Status: Married LIVES WITH WIFE Children: 2 CHILDREN Occupation: A/C CONTRACTOR/ COMPUTER OPERATOR, ESTIMATOR  Risk Factors: Alcohol Use: rare (06/22/2009) Caffeine Use: 1 (06/22/2009) Exercise: no (06/22/2009)  Risk Factors: Smoking Status: quit (06/22/2009) Packs/Day: 1995 (06/22/2009) Passive Smoke Exposure: yes (06/22/2009)  Family History: Father: DECEASED 60YOA, MULTIPLE MYELOMA  Mother: A 68 ,HTN,CAD, MI, STENTS X2-3 Siblings: 3 SISTERS CV: + PATERNAL SIDE BP: + MOTHER DIABETES: NEGATIVE CANCER:  + FATHER MULTIPLE MYELOMA/ AUNTS UNCLES OTHER: + STROKE MOTHER MINI  Review of Systems General:  Denies chills, fatigue, fever, sweats, weakness, and weight loss. Eyes:  Complains of blurring; denies discharge, eye irritation, and eye pain; distant vision . ENT:  Denies decreased hearing, ear discharge, earache, and ringing in ears. CV:  Denies chest pain or discomfort, fainting, fatigue, palpitations, and shortness of breath with exertion. Resp:  Denies cough, shortness of breath, and wheezing. GI:  Denies abdominal pain, bloody stools, change in bowel habits, constipation, dark tarry stools, diarrhea, indigestion, loss of appetite, nausea, vomiting, vomiting blood, and yellowish skin color. GU:  Complains of nocturia; denies discharge, dysuria, incontinence, and urinary frequency. MS:  Complains of joint pain and low back pain; denies muscle aches and cramps; right knee. Derm:  Denies dryness, itching, and rash. Neuro:  Complains of tremors; denies numbness, poor balance, and tingling; mild episodic .  Physical Exam  General:  Well-developed,well-nourished,in no acute distress; alert,appropriate and cooperative throughout examination, mildly more obese. Head:  Normocephalic and atraumatic without obvious abnormalities. No apparent alopecia or balding. Eyes:  Conjunctiva clear bilaterally.  Ears:  External ear exam shows no significant lesions or deformities.  Otoscopic examination reveals clear canals, tympanic membranes are intact bilaterally without bulging, retraction, inflammation or discharge. Hearing is grossly normal bilaterally. Nose:  External nasal examination shows no deformity or inflammation. Nasal mucosa are pink and moist without lesions or exudates. Mouth:  Oral mucosa and oropharynx without lesions or exudates.  Teeth in good repair. Neck:  No deformities, masses, or tenderness noted. Chest Wall:  No deformities, masses, tenderness or gynecomastia noted. Breasts:  No  masses or gynecomastia noted Lungs:  Normal respiratory effort, chest expands symmetrically. Lungs are clear to auscultation, no crackles or wheezes. Heart:  Normal rate and regular rhythm. S1 and S2 normal without gallop, murmur, click, rub or other extra sounds. Abdomen:  Bowel sounds positive,abdomen soft and non-tender without masses, organomegaly or hernias noted. Rectal:  No external abnormalities noted. Normal sphincter tone. No rectal masses or tenderness. Genitalia:  Testes bilaterally descended without nodularity, tenderness or masses. No scrotal masses or lesions. No penis lesions or urethral discharge. Prostate:  Prostate gland firm and smooth, no enlargement, nodularity, tenderness, mass, asymmetry or induration. Msk:  No deformity or scoliosis noted of thoracic or lumbar spine.   Pulses:  R and L carotid,radial,femoral,dorsalis pedis and posterior tibial pulses are full and equal bilaterally Extremities:  No clubbing, cyanosis, edema, or deformity noted with normal full range of motion of all joints.   Neurologic:  No cranial nerve deficits noted. Station and gait are normal. Plantar reflexes are down-going bilaterally. DTRs are symmetrical throughout. Sensory, motor and coordinative functions appear intact. Skin:  Intact without suspicious lesions or rashes Cervical Nodes:  No lymphadenopathy noted Inguinal Nodes:  No significant adenopathy Psych:  Cognition and judgment appear intact. Alert and cooperative with normal attention span and concentration. No apparent delusions, illusions, hallucinations   Impression & Recommendations:  Problem # 1:  HEALTH MAINTENANCE EXAM (ICD-V70.0) Assessment Comment Only Tdap Reviewed preventive care protocols, scheduled due services, and updated immunizations.  Problem # 2:  SPECIAL SCREENING MALIGNANT NEOPLASM OF PROSTATE (ICD-V76.44) Assessment: Unchanged Nml PSA and exam...no tenderness or irritation felt. No fasiculation felt anywhere  but was what I got from his description.   Problem # 3:  LUMBOSACRAL STRAIN (ICD-846.0) Assessment: Unchanged Chronically stqble with regular discomfort.  Problem # 4:  HEART MURMUR, BENIGN (ICD-785.2) Assessment: Improved  Not heard today.  Problem # 5:  DIABETES MELLITUS, TYPE II (ICD-250.00) Assessment: Unchanged A1C elevated.... "was not good over the holidays" His updated medication list for this problem includes:    Metformin Hcl 500 Mg Tabs (Metformin hcl) ..... One tab by mouth at night.  Labs Reviewed: Creat: 1.0 (06/17/2009)   Microalbumin: 4.3 (04/17/2006) Reviewed HgBA1c results: 7.1 (06/17/2009)  6.7 (01/28/2009)  Problem # 6:  HYPERLIPIDEMIA (ICD-272.4) Assessment: Unchanged Is on statin, LDL acceptable, HDL low...exercise will help. His updated medication list for this problem includes:    Pravastatin Sodium 80 Mg Tabs (Pravastatin sodium) .Marland Kitchen... Take 1 tablet at bedtime  Labs Reviewed: SGOT: 28 (06/17/2009)   SGPT: 34 (06/17/2009)   HDL:24.20 (06/17/2009), 21.9 (04/30/2008)  LDL:92 (06/17/2009), 98 (04/30/2008)  Chol:143 (06/17/2009), 139 (04/30/2008)  Trig:134.0 (06/17/2009), 96 (04/30/2008)  Problem # 7:  GERD (ICD-530.81) Assessment: Unchanged  Stable. His updated medication list for this problem includes:    Zantac 150 Mg Caps (Ranitidine hcl) ..... One tab by mouth two times a day  Diagnostics Reviewed:  Discussed lifestyle modifications, diet, antacids/medications, and preventive measures. Handout provided.   Complete Medication List: 1)  Pravastatin Sodium 80 Mg Tabs (Pravastatin sodium) .... Take 1 tablet at bedtime 2)  Fish Oil 1000 Mg Caps (Omega-3 fatty acids) .Marland Kitchen.. 1 twice a day by mouth 3)  Osteo Bi-flex Adv Triple St Tabs (Misc natural products) .Marland Kitchen.. 1 daily by mouth as needed 4)  Flexeril 10 Mg Tabs (Cyclobenzaprine hcl) .... One tab by mouth three times a day as needed 5)  Hemorrhoidal-hc 25 Mg Supp (Hydrocortisone acetate) .... Use  suppositories three times a day as needed 6)  Proctofoam Hc 1-1 % Foam (Hydrocortisone ace-pramoxine) .... Use as dir 7)  Cpap 12 Advanced  8)  Temazepam 15 Mg Caps (Temazepam) .Marland Kitchen.. 1 or 2 for sleep as needed 9)  Metformin Hcl 500 Mg Tabs (Metformin hcl) .... One tab by mouth at night. 10)  Zantac 150 Mg Caps (Ranitidine hcl) .... One tab by mouth two times a day 11)  Meclizine Hcl 25 Mg Tabs (Meclizine hcl) .... One tab by mouth every 6 hours as needed for vertigo 12)  Zaleplon 10 Mg Caps (Zaleplon) .Marland Kitchen.. 1 for sleep if needed  Other Orders: Tdap => 89yrs IM (16109) Admin 1st Vaccine (60454)  Patient Instructions: 1)  RTC 6 mos A1C prior 250.00 2)  Get eye exam, last 2 years ago.  Current Allergies (reviewed today): No known allergies    Immunizations Administered:  Tetanus Vaccine:    Vaccine Type: Tdap    Site: left deltoid    Mfr: GlaxoSmithKline    Dose: 0.5 ml    Route: IM    Given by: Linde Gillis CMA (AAMA)    Exp. Date: 08/15/2011    Lot #: [id number]    VIS given: 05/08/07 version given June 22, 2009.

## 2010-07-22 NOTE — Assessment & Plan Note (Signed)
Summary: CPX/CLE   Vital Signs:  Patient profile:   50 year old male Weight:      245 pounds Temp:     97.5 degrees F oral Pulse rate:   84 / minute Pulse rhythm:   regular BP sitting:   124 / 78  (left arm) Cuff size:   large  Vitals Entered By: Sydell Axon LPN (July 07, 2010 2:55 PM) CC: 30 Minute checkup, leg pain and right knee pain   History of Present Illness: Pt here for Comp Exam. His right knee was doing well but got inflamed.  Preventive Screening-Counseling & Management  Alcohol-Tobacco     Alcohol drinks/day: rare     Alcohol type: mixed     Smoking Status: quit     Packs/Day: 1995     Year Quit: 1995      Pack years: 16     Passive Smoke Exposure: yes  Caffeine-Diet-Exercise     Caffeine use/day: 1     Does Patient Exercise: no  Problems Prior to Update: 1)  Cervical Muscle Strain  (ICD-847.0) 2)  Special Screening Malig Neoplasms Other Sites  (ICD-V76.49) 3)  Benign Positional Vertigo  (ICD-386.11) 4)  Health Maintenance Exam  (ICD-V70.0) 5)  Special Screening Malignant Neoplasm of Prostate  (ICD-V76.44) 6)  Lumbosacral Strain  (ICD-846.0) 7)  Heart Murmur, Benign  (ICD-785.2) 8)  Sleep Apnea  (ICD-780.57) 9)  Spermatocele, Left  (ICD-608.1) 10)  Otitis Externa, Acute, Left Recurrent  (ICD-380.22) 11)  Benign Prostatic Hypertrophy  (ICD-600.00) 12)  Hyperlipidemia  (ICD-272.4) 13)  Gerd  (ICD-530.81) 14)  Diabetes Mellitus, Type II  (ICD-250.00)  Medications Prior to Update: 1)  Pravastatin Sodium 80 Mg Tabs (Pravastatin Sodium) .... Take 1 Tablet At Bedtime 2)  Fish Oil 1000 Mg Caps (Omega-3 Fatty Acids) .Marland Kitchen.. 1 Twice A Day By Mouth 3)  Osteo Bi-Flex Adv Triple St  Tabs (Misc Natural Products) .Marland Kitchen.. 1 Daily By Mouth As Needed 4)  Flexeril 10 Mg Tabs (Cyclobenzaprine Hcl) .... One Tab By Mouth Three Times A Day As Needed 5)  Hemorrhoidal-Hc 25 Mg  Supp (Hydrocortisone Acetate) .... Use Suppositories Three Times A Day As Needed 6)  Proctofoam Hc  1-1 %  Foam (Hydrocortisone Ace-Pramoxine) .... Use As Dir 7)  Cpap 13 Advanced 8)  Temazepam 15 Mg Caps (Temazepam) .Marland Kitchen.. 1 or 2 For Sleep As Needed 9)  Metformin Hcl 500 Mg Tabs (Metformin Hcl) .... One Tab By Mouth in Am and Two At Night. 10)  Zantac 150 Mg Caps (Ranitidine Hcl) .... One Tab By Mouth Two Times A Day 11)  Meclizine Hcl 25 Mg Tabs (Meclizine Hcl) .... One Tab By Mouth Every 6 Hours As Needed For Vertigo 12)  Zaleplon 10 Mg Caps (Zaleplon) .Marland Kitchen.. 1 For Sleep If Needed  Current Medications (verified): 1)  Pravastatin Sodium 80 Mg Tabs (Pravastatin Sodium) .... Take 1 Tablet At Bedtime 2)  Fish Oil 1000 Mg Caps (Omega-3 Fatty Acids) .Marland Kitchen.. 1 Twice A Day By Mouth 3)  Osteo Bi-Flex Adv Triple St  Tabs (Misc Natural Products) .Marland Kitchen.. 1 Daily By Mouth As Needed 4)  Flexeril 10 Mg Tabs (Cyclobenzaprine Hcl) .... One Tab By Mouth Three Times A Day As Needed 5)  Hemorrhoidal-Hc 25 Mg  Supp (Hydrocortisone Acetate) .... Use Suppositories Three Times A Day As Needed 6)  Proctofoam Hc 1-1 %  Foam (Hydrocortisone Ace-Pramoxine) .... Use As Dir 7)  Cpap 13 Advanced 8)  Temazepam 15 Mg Caps (Temazepam) .Marland Kitchen.. 1 or  2 For Sleep As Needed 9)  Metformin Hcl 500 Mg Tabs (Metformin Hcl) .... One Tab By Mouth in Am and Two At Night. 10)  Zantac 150 Mg Caps (Ranitidine Hcl) .... One Tab By Mouth Two Times A Day 11)  Meclizine Hcl 25 Mg Tabs (Meclizine Hcl) .... One Tab By Mouth Every 6 Hours As Needed For Vertigo 12)  Zaleplon 10 Mg Caps (Zaleplon) .Marland Kitchen.. 1 For Sleep If Needed  Allergies: No Known Drug Allergies  Past History:  Past Medical History: Last updated: 03/11/2008 Diabetes mellitus, type II: 01/2003 GERD: 11/2002 Hyperlipidemia: 04/1989 Benign prostatic hypertrophy: 07/1997 Sleep Apnea  Past Surgical History: Last updated: 11/20/2006 SLEEP STUDY POS ON CPAP HEMTURIA W/U NEG (UROL) 12/1994 GANGLION CYSTECTOMY R WRIST ECHO: NORMAL:(01/13/2004): CHOLECYSTECOMY : (01/05/2004) VASECTOMY :  (05/26/2006)  Family History: Last updated: 07-14-10 Father: DECEASED 60YOA, MULTIPLE MYELOMA  Mother: A 50 ,HTN,CAD, MI, STENTS X2-3 Siblings: 3 SISTERS CV: + PATERNAL SIDE BP: + MOTHER DIABETES: NEGATIVE CANCER: + FATHER MULTIPLE MYELOMA/ AUNTS UNCLES OTHER: + STROKE MOTHER MINI  Social History: Last updated: 11/20/2006 Marital Status: Married LIVES WITH WIFE Children: 2 CHILDREN Occupation: A/C CONTRACTOR/ COMPUTER OPERATOR, ESTIMATOR  Risk Factors: Alcohol Use: rare (07-14-10) Caffeine Use: 1 (14-Jul-2010) Exercise: no (07/14/10)  Risk Factors: Smoking Status: quit (07-14-2010) Packs/Day: 1995 (2010/07/14) Passive Smoke Exposure: yes (July 14, 2010)  Family History: Father: DECEASED 60YOA, MULTIPLE MYELOMA  Mother: A 42 ,HTN,CAD, MI, STENTS X2-3 Siblings: 3 SISTERS CV: + PATERNAL SIDE BP: + MOTHER DIABETES: NEGATIVE CANCER: + FATHER MULTIPLE MYELOMA/ AUNTS UNCLES OTHER: + STROKE MOTHER MINI  Review of Systems General:  Denies chills, fatigue, fever, sweats, weakness, and weight loss. Eyes:  Denies blurring, discharge, and eye pain. ENT:  Denies decreased hearing, ear discharge, earache, and ringing in ears. CV:  Denies chest pain or discomfort, fainting, fatigue, palpitations, shortness of breath with exertion, swelling of feet, and swelling of hands. Resp:  Denies cough, shortness of breath, and wheezing. GI:  Complains of indigestion; denies abdominal pain, bloody stools, change in bowel habits, constipation, dark tarry stools, diarrhea, loss of appetite, nausea, vomiting, vomiting blood, and yellowish skin color; occas, doesn't use meds regularly.. GU:  Complains of nocturia; denies discharge, dysuria, and urinary frequency; chronic. MS:  Complains of joint pain, muscle, and cramps; denies low back pain, muscle aches, and stiffness; knee. Derm:  Denies dryness, itching, and rash. Neuro:  Denies numbness, poor balance, tingling, and tremors.  Physical  Exam  General:  Well-developed,well-nourished,in no acute distress; alert,appropriate and cooperative throughout examination, mildly  obese. Head:  Normocephalic and atraumatic without obvious abnormalities. No apparent alopecia or balding, some thinning of hair.. Eyes:  Conjunctiva clear bilaterally.  Ears:  External ear exam shows no significant lesions or deformities.  Otoscopic examination reveals clear canals, tympanic membranes are intact bilaterally without bulging, retraction, inflammation or discharge. Hearing is grossly normal bilaterally. Nose:  External nasal examination shows no deformity or inflammation. Nasal mucosa are pink and moist without lesions or exudates. Mouth:  Oral mucosa and oropharynx without lesions or exudates.  Teeth in good repair. Neck:  No deformities, masses, or tenderness noted. Chest Wall:  No deformities, masses, tenderness or gynecomastia noted. Breasts:  No masses or gynecomastia noted Lungs:  Normal respiratory effort, chest expands symmetrically. Lungs are clear to auscultation, no crackles or wheezes. Heart:  Normal rate and regular rhythm. S1 and S2 normal without gallop, murmur, click, rub or other extra sounds. Abdomen:  Bowel sounds positive,abdomen soft and non-tender without masses, organomegaly  or hernias noted. Rectal:  No external abnormalities noted. Normal sphincter tone. No rectal masses or tenderness. G neg. Genitalia:  Testes bilaterally descended without nodularity, tenderness or masses. No scrotal masses or lesions. No penis lesions or urethral discharge. Prostate:  Prostate gland firm and smooth, no enlargement, nodularity, tenderness, mass, asymmetry or induration. 20gms. Msk:  No deformity or scoliosis noted of thoracic or lumbar spine.   Pulses:  R and L carotid,radial,femoral,dorsalis pedis and posterior tibial pulses are full and equal bilaterally Extremities:  No clubbing, cyanosis, edema, or deformity noted with normal full range  of motion of all joints.   Neurologic:  No cranial nerve deficits noted. Station and gait are normal. Sensory, motor and coordinative functions appear intact. Skin:  Intact without suspicious lesions or rashes Cervical Nodes:  No lymphadenopathy noted Inguinal Nodes:  No significant adenopathy Psych:  Cognition and judgment appear intact. Alert and cooperative with normal attention span and concentration. No apparent delusions, illusions, hallucinations  Diabetes Management Exam:    Foot Exam (with socks and/or shoes not present):       Sensory-Pinprick/Light touch:          Left medial foot (L-4): normal          Left dorsal foot (L-5): normal          Left lateral foot (S-1): normal          Right medial foot (L-4): normal          Right dorsal foot (L-5): normal          Right lateral foot (S-1): normal       Sensory-Monofilament:          Left foot: normal          Right foot: normal       Inspection:          Left foot: normal          Right foot: normal       Nails:          Left foot: normal          Right foot: normal   Impression & Recommendations:  Problem # 1:  HEALTH MAINTENANCE EXAM (ICD-V70.0)  Reviewed preventive care protocols, scheduled due services, and updated immunizations.  Problem # 2:  BENIGN POSITIONAL VERTIGO (ICD-386.11) Assessment: Unchanged Rare sxs. His updated medication list for this problem includes:    Meclizine Hcl 25 Mg Tabs (Meclizine hcl) ..... One tab by mouth every 6 hours as needed for vertigo  Problem # 3:  SPECIAL SCREENING MALIGNANT NEOPLASM OF PROSTATE (ICD-V76.44) Assessment: Unchanged Stable PSA and exam.  Problem # 4:  SLEEP APNEA (ICD-780.57) Assessment: Unchanged In midst of having pressure increased.  Problem # 5:  BENIGN PROSTATIC HYPERTROPHY (ICD-600.00) Assessment: Unchanged Sxs stable.  Problem # 6:  HYPERLIPIDEMIA (ICD-272.4) Assessment: Unchanged Adequate except HDL, exercise. His updated medication list for  this problem includes:    Pravastatin Sodium 80 Mg Tabs (Pravastatin sodium) .Marland Kitchen... Take 1 tablet at bedtime Start Co Q 10 to help poss musc pains.  Labs Reviewed: SGOT: 40 (06/29/2010)   SGPT: 49 (06/29/2010)   HDL:26.60 (06/29/2010), 24.20 (06/17/2009)  LDL:91 (06/29/2010), 92 (06/17/2009)  Chol:147 (06/29/2010), 143 (06/17/2009)  Trig:145.0 (06/29/2010), 134.0 (06/17/2009)  Problem # 7:  DIABETES MELLITUS, TYPE II (ICD-250.00) Assessment: Unchanged Unchanged....improve diet and exercise. His updated medication list for this problem includes:    Metformin Hcl 500 Mg Tabs (Metformin hcl) ..... One tab by mouth  in am and two at night.  Labs Reviewed: Creat: 1.0 (06/29/2010)   Microalbumin: 4.3 (04/17/2006) Reviewed HgBA1c results: 7.7 (06/29/2010)  7.5 (12/09/2009)  Problem # 8:  GERD (ICD-530.81) Assessment: Unchanged Stable. His updated medication list for this problem includes:    Zantac 150 Mg Caps (Ranitidine hcl) ..... One tab by mouth two times a day  Problem # 9:  KNEE PAIN, RIGHT (ICD-719.46) Assessment: Unchanged  Off and on. Discussed multiple approaches to help sxs. His updated medication list for this problem includes:    Flexeril 10 Mg Tabs (Cyclobenzaprine hcl) ..... One tab by mouth three times a day as needed  Discussed strengthening exercises, use of ice or heat, and medications.   Complete Medication List: 1)  Pravastatin Sodium 80 Mg Tabs (Pravastatin sodium) .... Take 1 tablet at bedtime 2)  Fish Oil 1000 Mg Caps (Omega-3 fatty acids) .Marland Kitchen.. 1 twice a day by mouth 3)  Osteo Bi-flex Adv Triple St Tabs (Misc natural products) .Marland Kitchen.. 1 daily by mouth as needed 4)  Flexeril 10 Mg Tabs (Cyclobenzaprine hcl) .... One tab by mouth three times a day as needed 5)  Hemorrhoidal-hc 25 Mg Supp (Hydrocortisone acetate) .... Use suppositories three times a day as needed 6)  Proctofoam Hc 1-1 % Foam (Hydrocortisone ace-pramoxine) .... Use as dir 7)  Cpap 13 Advanced  8)   Temazepam 15 Mg Caps (Temazepam) .Marland Kitchen.. 1 or 2 for sleep as needed 9)  Metformin Hcl 500 Mg Tabs (Metformin hcl) .... One tab by mouth in am and two at night. 10)  Zantac 150 Mg Caps (Ranitidine hcl) .... One tab by mouth two times a day 11)  Meclizine Hcl 25 Mg Tabs (Meclizine hcl) .... One tab by mouth every 6 hours as needed for vertigo 12)  Zaleplon 10 Mg Caps (Zaleplon) .Marland Kitchen.. 1 for sleep if needed  Patient Instructions: 1)  Call Dr Maple Hudson for adjustment. He thinks 13cm an increase. 2)  RTC 3 mos, A1C prior.   Orders Added: 1)  Est. Patient 40-64 years [[id number]]    Current Allergies (reviewed today): No known allergies

## 2010-07-22 NOTE — Progress Notes (Signed)
Summary: nos appt  Phone Note Call from Patient   Caller: [email or web address]  Call For: young Summary of Call: LMTCB x2 to rsc nos from 9/20. Initial call taken by: Darletta Moll,  March 10, 2010 3:36 PM

## 2010-07-22 NOTE — Progress Notes (Signed)
Summary: CPAP pressure increase  Phone Note Call from Patient   Caller: Patient Call For: dr Maple Hudson Action Taken: Rx Called In Summary of Call: Patient phoned and stated at his last appointment Dr. Maple Hudson stated that he was going to have Edward Wright pressure increased on his CPAP. Edward Wright uses [facility] and [facility] states that they have not received the orders to increase the pressure. Pt can be reached [phone] Initial call taken by: Vedia Coffer,  June 28, 2010 1:17 PM  Follow-up for Phone Call        Spoke with pt who advised [facility] has never come to his home or contacted him about his  CPAP pressure changed. I called [facility] and was informed they never received order. I printed off same and faxed to [phone]. Pt informed and advised to call them if he does not hear from them. Edward Wright CMA  June 28, 2010 2:34 PM

## 2010-11-02 NOTE — Assessment & Plan Note (Signed)
[facility]                             PULMONARY OFFICE NOTE   NAME:Edward Wright, Edward Wright                    MRN:          [id number]  DATE:03/13/2007                            DOB:          April 29, 1961    PROBLEM LIST:  Obstructive sleep apnea with hypersomnia.   HISTORY:  One year followup.  He is doing very well since we adjusted  his pressure to 11 CWP, and he feels no further changes are needed.  He  uses the machine every night.  He is more aware of his wife's snoring  now, but she no longer complains about him at all.  He feels comfortably  rested.   MEDICATIONS:  1. CPAP of 11 CWP.  2. Pravastatin.  3. Protonix p.r.n. use.   No medication allergy.   OBJECTIVE:  Weight 246 pounds, BP 122/78, pulse 76, room air saturation  98%.  He is overweight.  Turbinates are edematous.  He is quite alert.  No  pressure marks on his face from the mask.  Breathing is unlabored.  Pulse regular without murmur.   IMPRESSION:  Stable control of obstructive sleep apnea, on continuous  positive airway pressure, now, at 11.   PLAN:  Weight loss, sleep hygiene, reinforcement.  Schedule return 1  year, earlier p.r.n.  Meanwhile, he will continue continuous positive  airway pressure at 11 CWP.     Clinton D. Maple Hudson, MD, Tonny Bollman, FACP  Electronically Signed    CDY/MedQ  DD: 03/13/2007  DT: 03/14/2007  Job #: [id number]   cc:   Arta Silence, MD

## 2010-11-05 NOTE — Op Note (Signed)
NAME:  Edward Wright, Edward Wright NO.:  [id number]   MEDICAL RECORD NO.:  [id number]                   PATIENT TYPE:  OBV   LOCATION:  0443                                 FACILITY:  [facility]   PHYSICIAN:  Ollen Gross. Vernell Morgans, M.D.              DATE OF BIRTH:  [birth year]   DATE OF PROCEDURE:  01/02/2004  DATE OF DISCHARGE:  01/03/2004                                 OPERATIVE REPORT   PREOPERATIVE DIAGNOSIS:  Gallstones.   POSTOPERATIVE DIAGNOSIS:  Gallstones.   PROCEDURE:  Laparoscopic cholecystectomy with intraoperative cholangiogram.   SURGEON:  Ollen Gross. Carolynne Edouard, M.D.   ASSISTANT:  Dr. Earlene Plater.   ANESTHESIA:  General endotracheal.   DESCRIPTION OF PROCEDURE:  After informed consent was obtained, the patient  was brought to the operating room and placed in the supine position on the  operating table.  After adequate induction of general anesthesia, the  patient's abdomen was prepped with Betadine and draped in the usual sterile  manner.  The area below the umbilicus was infiltrated with 0.25% Marcaine  and a small incision was made with a 15 blade knife.  This incision was  carried down through the subcutaneous tissue bluntly with a hemostat and  Army-Navy retractors until the linea alba was identified.  The linea alba  was incised with the 15 blade knife and each site was grasped with grasped  with Kocher clamps and elevated anteriorly.  The preperitoneal space was  then probed bluntly with a hemostat until the peritoneum was opened and  access was gained to the abdominal cavity.  A 0 Vicryl purse-string stitch  was placed in the fascia surrounding the opening.  A Hasson cannula was  placed through the opening and anchored in place with the previously placed  Vicryl purse-string stitch.  The abdomen was then insufflated with carbon  dioxide without difficulty.  The patient was placed in a head up position  and rotated slightly with the right side up.  The  laparoscope was placed  through the Hasson cannula and the right upper quadrant was inspected.  The  dome of the gallbladder and liver were readily identified.  The epigastric  region was infiltrated with 0.25% Marcaine and a small incision was made  with a 15 blade knife and a 10-mm port was placed bluntly through this  incision into the abdominal cavity under direct vision.  Sites were chosen  laterally on the right side of the abdomen, placing a 5-mm port and each of  these areas was infiltrated with 0.25% Marcaine.  Small stab incisions were  made with a 15 blade knife and 5-mm ports were placed bluntly through these  incisions into the abdominal cavity under direct vision.  A blunt grasper  was placed in the lateral most 5-mm port and used to grasp the dome of the  gallbladder and elevated anteriorly and superiorly.  Another blunt grasper  was placed through  the other 5-mm port and used to retract on the body and  neck of the gallbladder.  A dissector was placed through the epigastric port  and using the electrocautery the peritoneal reflection of the gallbladder  neck area was opened.  Blunt dissection was then carried out in this area  until the gallbladder neck and cystic duct junction was readily identified  and a good window was created.  A single clip was placed on the gallbladder  neck and a small ductotomy was made just below this with the laparoscopic  scissors.  A 14 gauge Angiocath was then placed percutaneously through the  anterior abdominal wall under direct vision and a Reddick cholangiogram  catheter was then placed through the Angiocath and flushed.  The Reddick  catheter was then placed within the cystic duct and anchored in place with a  clip.  A cholangiogram was obtained that showed no filling defects, a good  length on the cystic duct and  rapid emptying into the duodenum.  The anchor  clip and catheter were then removed from the patient.  Three clips were  placed  proximally on the cystic duct and the duct was divided between the  two sets of clips.  __________ the cystic artery was identified and again  dissected bluntly in a circumferential manner until a good window was  created.  Two clips were placed proximally and one distally on the artery  and the artery was divided between the two.  Next, a laparoscopic clip  cautery device was used to separate the gallbladder from the liver bed.  Prior to completely detaching the gallbladder from the liver bed, the liver  bed was inspected and several small bleeding points were coagulated with the  electrocautery until the liver bed was completely hemostatic.  The  gallbladder was then detached from the liver bed without difficulty.  A  laparoscopic bag was then placed through the epigastric port and the  gallbladder was placed within the bag and the bag was sealed.  The  laparoscope was then moved to the epigastric port, gallbladder graspers were  placed through the Hasson cannula and used to grasp the opening in the bag  and the bag with the gallbladder was then removed through the infraumbilical  port without difficulty.  The fascial defect was closed with __________  compression stitch as well as with another interrupted 0 Vicryl stitch.  The  abdomen was irrigated with copious amounts of saline until the effluent was  clear.  The liver bed was inspected again and found to be hemostatic.  The  ports were then all removed under direct vision and were found to be  hemostatic.  The gas was allowed to escape and the skin incisions were all  closed with interrupted 4-0 Monocryl subcuticular stitches.  Benzoin and  Steri-Strips were applied.  The patient tolerated the procedure well.  At  the end of the case, all needle, sponge, and instrument counts were correct.  The patient was then awakened and taken to the recovery room in stable  condition.                                               Ollen Gross. Vernell Morgans, M.D.   PST/MEDQ  D:  01/05/2004  T:  01/05/2004  Job:  [id number]

## 2010-11-09 ENCOUNTER — Encounter: Payer: Self-pay | Admitting: Family Medicine

## 2010-11-10 ENCOUNTER — Ambulatory Visit (INDEPENDENT_AMBULATORY_CARE_PROVIDER_SITE_OTHER): Payer: 59 | Admitting: Family Medicine

## 2010-11-10 ENCOUNTER — Encounter: Payer: Self-pay | Admitting: Family Medicine

## 2010-11-10 VITALS — BP 110/76 | HR 80 | Temp 97.9°F | Ht 71.0 in | Wt 246.8 lb

## 2010-11-10 DIAGNOSIS — N451 Epididymitis: Secondary | ICD-10-CM | POA: Insufficient documentation

## 2010-11-10 DIAGNOSIS — N453 Epididymo-orchitis: Secondary | ICD-10-CM

## 2010-11-10 MED ORDER — CIPROFLOXACIN HCL 500 MG PO TABS: 500.0000 mg | ORAL_TABLET | Freq: Two times a day (BID) | ORAL | Status: AC

## 2010-11-10 NOTE — Patient Instructions (Signed)
RTC 3 weeks

## 2010-11-10 NOTE — Progress Notes (Signed)
  Subjective:    Patient ID: Edward Wright, male    DOB: Jul 15, 1960, 50 y.o.   MRN: [id number]  HPI Pt here for acute visit for ache in the right testicle for the last two to three weeks, worse in the last few days. He had this occas when seen in Jan but would go away. He has not noticed swelling but there feels to be a thickness and firmness compared to the other side. He has taken some Motrin with good results. The discomfort was coming and going but now is constant. Ejaculation helps some after, there was some discomfort with ejaculation.     Review of SystemsNoncontributory except as above.       Objective:   Physical Exam  Genitourinary: Penis normal. No penile tenderness.       Left epidiymus nml, R tender and swollen, no adenopathy felt. No torsion noticed. Same elevation on each side.          Assessment & Plan:

## 2010-11-10 NOTE — Assessment & Plan Note (Addendum)
Treat with Cipro for three weeks. RTC if sxs don't improve.

## 2010-11-30 ENCOUNTER — Encounter: Payer: Self-pay | Admitting: Family Medicine

## 2010-11-30 ENCOUNTER — Ambulatory Visit (INDEPENDENT_AMBULATORY_CARE_PROVIDER_SITE_OTHER): Payer: 59 | Admitting: Family Medicine

## 2010-11-30 DIAGNOSIS — N451 Epididymitis: Secondary | ICD-10-CM

## 2010-11-30 DIAGNOSIS — N453 Epididymo-orchitis: Secondary | ICD-10-CM

## 2010-11-30 NOTE — Assessment & Plan Note (Signed)
Resolving, finish Abs. Will recheck at PE in July.

## 2010-11-30 NOTE — Progress Notes (Signed)
  Subjective:    Patient ID: Edward Wright, male    DOB: 01/20/1961, 50 y.o.   MRN: [id number]  HPI Pt here for followup of epididymitis treated with Cipro. He has tolerated Cipro fine, has no pain now, not as swollen but he can still feel something there. He still has 5 days of medication to take. He has no other complaints.    Review of SystemsNoncontributory.     Objective:   Physical Exam  Constitutional: He appears well-developed and well-nourished. No distress.  HENT:  Head: Normocephalic and atraumatic.  Right Ear: External ear normal.  Left Ear: External ear normal.  Nose: Nose normal.  Mouth/Throat: Oropharynx is clear and moist.  Eyes: Conjunctivae and EOM are normal. Pupils are equal, round, and reactive to light. Right eye exhibits no discharge. Left eye exhibits no discharge.  Neck: Normal range of motion. Neck supple.  Cardiovascular: Normal rate and regular rhythm.   Pulmonary/Chest: Effort normal and breath sounds normal. He has no wheezes.  Genitourinary:       Epididymus benign bilat, slight scarring/fullness of right side. Anatomically higher than left and normal for him.  Lymphadenopathy:    He has no cervical adenopathy.  Skin: He is not diaphoretic.          Assessment & Plan:

## 2010-12-18 ENCOUNTER — Other Ambulatory Visit: Payer: Self-pay | Admitting: Family Medicine

## 2010-12-23 ENCOUNTER — Ambulatory Visit: Payer: 59 | Admitting: Family Medicine

## 2010-12-27 ENCOUNTER — Other Ambulatory Visit: Payer: Self-pay | Admitting: Family Medicine

## 2010-12-27 DIAGNOSIS — E119 Type 2 diabetes mellitus without complications: Secondary | ICD-10-CM

## 2010-12-28 ENCOUNTER — Other Ambulatory Visit (INDEPENDENT_AMBULATORY_CARE_PROVIDER_SITE_OTHER): Payer: 59 | Admitting: Family Medicine

## 2010-12-28 DIAGNOSIS — E119 Type 2 diabetes mellitus without complications: Secondary | ICD-10-CM

## 2010-12-29 LAB — HEMOGLOBIN A1C: Hgb A1c MFr Bld: 7.9 % — ABNORMAL HIGH (ref 4.6–6.5)

## 2011-01-05 ENCOUNTER — Encounter: Payer: Self-pay | Admitting: Family Medicine

## 2011-01-05 ENCOUNTER — Ambulatory Visit (INDEPENDENT_AMBULATORY_CARE_PROVIDER_SITE_OTHER): Payer: 59 | Admitting: Family Medicine

## 2011-01-05 DIAGNOSIS — N451 Epididymitis: Secondary | ICD-10-CM

## 2011-01-05 DIAGNOSIS — M549 Dorsalgia, unspecified: Secondary | ICD-10-CM | POA: Insufficient documentation

## 2011-01-05 DIAGNOSIS — N453 Epididymo-orchitis: Secondary | ICD-10-CM

## 2011-01-05 MED ORDER — METFORMIN HCL 500 MG PO TABS: ORAL_TABLET | ORAL | Status: DC

## 2011-01-05 NOTE — Patient Instructions (Addendum)
RTC 4 mos with A1C prior 250.00 Start back on muscle relaxer. Use heat and ice. RTC for more manipulation if needed.

## 2011-01-05 NOTE — Progress Notes (Signed)
  Subjective:    Patient ID: Edward Wright, male    DOB: Jan 26, 1961, 50 y.o.   MRN: [id number]  HPI Pt here for recheck. His pain in the scrotum has greatly improved. He is also here for followup of diabetes. His A1C last time was 7.5. He also has back pain again. It is in the left lower SI area for secs that is very intensive and then aches a while after that. I had done manipulation in the past and he would like that done again. He thinks he probably did something to irritater it but doesn't remember what. He thinks it was probably Wed two  Weeks ago. He has no radiculopathy.    Review of SystemsNoncontributory except as above.       Objective:   Physical Exam  Constitutional: He appears well-developed and well-nourished. No distress.  HENT:  Head: Normocephalic and atraumatic.  Right Ear: External ear normal.  Left Ear: External ear normal.  Nose: Nose normal.  Mouth/Throat: Oropharynx is clear and moist.  Eyes: Conjunctivae and EOM are normal. Pupils are equal, round, and reactive to light. Right eye exhibits no discharge. Left eye exhibits no discharge.  Neck: Normal range of motion. Neck supple.  Cardiovascular: Normal rate and regular rhythm.   Pulmonary/Chest: Effort normal and breath sounds normal. He has no wheezes.  Musculoskeletal:       Decreased ROM at the waist with flexion, limited to 80 degrees. Ext hurts at 20 degrees.  Lymphadenopathy:    He has no cervical adenopathy.  Skin: He is not diaphoretic.          Assessment & Plan:

## 2011-01-05 NOTE — Assessment & Plan Note (Signed)
Improved on AB. Finish.

## 2011-01-05 NOTE — Assessment & Plan Note (Signed)
Manipulation undertaken with good results. Return if feels it will help to do again.

## 2011-01-13 ENCOUNTER — Encounter: Payer: Self-pay | Admitting: Family Medicine

## 2011-01-13 ENCOUNTER — Ambulatory Visit (INDEPENDENT_AMBULATORY_CARE_PROVIDER_SITE_OTHER): Payer: 59 | Admitting: Family Medicine

## 2011-01-13 DIAGNOSIS — M549 Dorsalgia, unspecified: Secondary | ICD-10-CM

## 2011-01-13 NOTE — Progress Notes (Signed)
  Subjective:    Patient ID: Edward Wright, male    DOB: 01-20-1961, 50 y.o.   MRN: [id number]  HPI Pt here for back pain. Had done manipulation on him for this last week. He feels this has helped and his symptoms are better but still there with some ache in the left buttock. He has to sit a lot on the job which is probably keeping him from progressing rapidly. He also is used to bouncing back from these exacerbations and I explained that the more frequently they happened the longer each episode will take to get over.    Review of Systems  Musculoskeletal: Positive for back pain (improved but there. No saddle numbness.).       Objective:   Physical Exam  Musculoskeletal:       ROM improved over last exam but still abnml.          Assessment & Plan:

## 2011-01-13 NOTE — Assessment & Plan Note (Signed)
Manipulation again undertaken. Good response with more ROM and less tingling and pain. Buttock radiculopathy improved. Try to be more aggressive with muscle relaxer for a week or so. Take IBP 600mg  total after brfst and supper for 2 weeks or so. RTC if felt manipulation will help again.

## 2011-01-18 ENCOUNTER — Other Ambulatory Visit: Payer: Self-pay | Admitting: Family Medicine

## 2011-02-02 ENCOUNTER — Encounter: Payer: Self-pay | Admitting: Family Medicine

## 2011-02-02 ENCOUNTER — Ambulatory Visit (INDEPENDENT_AMBULATORY_CARE_PROVIDER_SITE_OTHER): Payer: 59 | Admitting: Family Medicine

## 2011-02-02 DIAGNOSIS — M25469 Effusion, unspecified knee: Secondary | ICD-10-CM

## 2011-02-02 DIAGNOSIS — M25569 Pain in unspecified knee: Secondary | ICD-10-CM

## 2011-02-02 DIAGNOSIS — M549 Dorsalgia, unspecified: Secondary | ICD-10-CM

## 2011-02-02 NOTE — Assessment & Plan Note (Signed)
Not really pain but asymmetry and will have pt see Ortho to insure no problem. It is difficult to tell if firm cyst, mass or merely part of the tibial plateau that is slightly hypertrophied.

## 2011-02-02 NOTE — Patient Instructions (Signed)
Refer to Ortho?

## 2011-02-02 NOTE — Progress Notes (Signed)
  Subjective:    Patient ID: Edward Wright, male    DOB: July 07, 1960, 50 y.o.   MRN: [id number]  HPI Pt here for his back which continues to bother him. He has seen Dr Thalia Bloodgood, a chiropracter at the [facility] who has done manipulation. I have done manipulation the last two times here for this episode which has helped but not as dramatically as in the past when he had acute pain. His pain has now been going on for approx one month and is typically in the left SI area with radiculopathy down the left leg when he actively moves like getting up, laying to sitting or sitting to standing. He also has a bump/swelling of the medial aspect of his tibial plateau of the right knee that his wife noticed the last time they were at the beach. He does not remember any acute trauma in the past and no twists or sprains. He has no pain and it has not been tender to palpation. He has not noticed redness or warmth.    Review of SystemsNoncontributory except as above.       Objective:   Physical Exam  Musculoskeletal:       Decreased ROM of the lower back, discomfort on the left side in the SI area which worsens with active motion and shoots pain down the left leg. Right knee signif for asymetry of the medial plateau area anterioraly of the right knee wrt the left, nontender and no warmth felt.          Assessment & Plan:

## 2011-02-02 NOTE — Assessment & Plan Note (Addendum)
Manipulation undertaken for the third and final time here today, minimally effective. Will refer to Ortho for eval and trmt. Discussed continued ice and heat, especially as soon as possible due to the manipulation done today.

## 2011-03-14 ENCOUNTER — Ambulatory Visit: Payer: Self-pay | Admitting: Internal Medicine

## 2011-03-22 ENCOUNTER — Ambulatory Visit (INDEPENDENT_AMBULATORY_CARE_PROVIDER_SITE_OTHER): Payer: 59 | Admitting: Internal Medicine

## 2011-03-22 ENCOUNTER — Encounter: Payer: Self-pay | Admitting: Internal Medicine

## 2011-03-22 VITALS — BP 136/82 | HR 103 | Ht 72.0 in | Wt 250.8 lb

## 2011-03-22 DIAGNOSIS — G4733 Obstructive sleep apnea (adult) (pediatric): Secondary | ICD-10-CM

## 2011-03-22 DIAGNOSIS — G4730 Sleep apnea, unspecified: Secondary | ICD-10-CM

## 2011-03-22 DIAGNOSIS — Z9989 Dependence on other enabling machines and devices: Secondary | ICD-10-CM

## 2011-03-22 MED ORDER — TEMAZEPAM 15 MG PO CAPS: 30.0000 mg | ORAL_CAPSULE | Freq: Every evening | ORAL | Status: DC | PRN

## 2011-03-22 MED ORDER — ZALEPLON 10 MG PO CAPS: 10.0000 mg | ORAL_CAPSULE | Freq: Every evening | ORAL | Status: DC | PRN

## 2011-03-22 NOTE — Progress Notes (Signed)
HPI- 03/22/11- 50 year old male former smoker followed for obstructive sleep apnea/CPAP Last here 04/02/2010. We had increased CPAP to 13 CWP and he would like further increase because he still feels a little tired. Back pain actually disturbs his sleep and I think may be a bigger problem. Bedtime around 10 or 11 PM up at 7 AM. Takes occasional temazepam or zaleplon. Occasional soft drink for caffeine in the morning but little coffee.  ROS- Constitutional:   No-   weight loss, night sweats, fevers, chills,+ fatigue, lassitude. HEENT:   No-  headaches, difficulty swallowing, tooth/dental problems, sore throat,       No-  sneezing, itching, ear ache, nasal congestion, post nasal drip,  CV:  No-   chest pain, orthopnea, PND, swelling in lower extremities, anasarca, dizziness, palpitations Resp: No-   shortness of breath with exertion or at rest.              No-   productive cough,  No non-productive cough,  No-  coughing up of blood.              No-   change in color of mucus.  No- wheezing.   Skin: No-   rash or lesions. GI:  No-   heartburn, indigestion, abdominal pain, nausea, vomiting, diarrhea,                 change in bowel habits, loss of appetite GU: No-   dysuria, change in color of urine, no urgency or frequency.  No- flank pain. MS:  No-   joint pain or swelling.  No- decreased range of motion.  No- back pain. Neuro- grossly normal to observation, Or:  Psych:  No- change in mood or affect. No depression or anxiety.  No memory loss.  OBJ- General- Alert, Oriented, Affect-appropriate, calm, Distress- none acute, overweight Skin- rash-none, lesions- none, excoriation- none Lymphadenopathy- none Head- atraumatic            Eyes- Gross vision intact, PERRLA, conjunctivae clear secretions            Ears- Hearing, canals-normal            Nose- Clear, no-Septal dev, mucus, polyps, erosion, perforation             Throat- Mallampati III , mucosa clear , drainage- none, tonsils-  atrophic Neck- flexible , trachea midline, no stridor , thyroid nl, carotid no bruit Chest - symmetrical excursion , unlabored           Heart/CV- RRR , no murmur , no gallop  , no rub, nl s1 s2                           - JVD- none , edema- none, stasis changes- none, varices- none           Lung- clear to P&A, wheeze- none, cough- none , dullness-none, rub- none           Chest wall-  Abd- tender-no, distended-no, bowel sounds-present, HSM- no Br/ Gen/ Rectal- Not done, not indicated Extrem- cyanosis- none, clubbing, none, atrophy- none, strength- nl Neuro- grossly intact to observation

## 2011-03-22 NOTE — Progress Notes (Signed)
  Subjective:    Patient ID: Edward Wright, male    DOB: 07/05/1960, 50 y.o.   MRN: [id number]  HPI    Review of Systems     Objective:   Physical Exam        Assessment & Plan:

## 2011-03-22 NOTE — Patient Instructions (Signed)
Refilled scripts for sleep meds to use if needed  [facility]  Consider trying an occasional caffeine caplet if needed to deal with the afternoon drowsiness if you can't take a nap.

## 2011-03-25 NOTE — Assessment & Plan Note (Signed)
I have encouraged weight loss. We're going to advance his CPAP pressure once more to 14. I also mentioned trying a little more caffeine at times, perhaps caffeine tablets.

## 2011-05-11 ENCOUNTER — Ambulatory Visit: Payer: 59 | Admitting: Family Medicine

## 2011-05-20 ENCOUNTER — Other Ambulatory Visit: Payer: Self-pay | Admitting: Family Medicine

## 2011-05-20 DIAGNOSIS — E119 Type 2 diabetes mellitus without complications: Secondary | ICD-10-CM

## 2011-05-23 ENCOUNTER — Other Ambulatory Visit (INDEPENDENT_AMBULATORY_CARE_PROVIDER_SITE_OTHER): Payer: 59

## 2011-05-23 DIAGNOSIS — E119 Type 2 diabetes mellitus without complications: Secondary | ICD-10-CM

## 2011-05-23 LAB — HEMOGLOBIN A1C: Hgb A1c MFr Bld: 8 % — ABNORMAL HIGH (ref 4.6–6.5)

## 2011-05-23 NOTE — Progress Notes (Signed)
Addended by: Alvina Chou on: 05/23/2011 08:53 AM   Modules accepted: Orders

## 2011-05-26 ENCOUNTER — Encounter: Payer: Self-pay | Admitting: Family Medicine

## 2011-05-26 ENCOUNTER — Ambulatory Visit (INDEPENDENT_AMBULATORY_CARE_PROVIDER_SITE_OTHER): Payer: 59 | Admitting: Family Medicine

## 2011-05-26 DIAGNOSIS — J069 Acute upper respiratory infection, unspecified: Secondary | ICD-10-CM | POA: Insufficient documentation

## 2011-05-26 DIAGNOSIS — E119 Type 2 diabetes mellitus without complications: Secondary | ICD-10-CM

## 2011-05-26 DIAGNOSIS — M549 Dorsalgia, unspecified: Secondary | ICD-10-CM

## 2011-05-26 NOTE — Assessment & Plan Note (Signed)
Resolving. See instructions.

## 2011-05-26 NOTE — Patient Instructions (Addendum)
RTC 3/13 for Comp Exam with Dr Para March, labs prior. Take Guaifenesin (400mg ), take 11/2 tabs by mouth AM and NOON. Get GUAIFENESIN by  going to [facility] or [facility] and getting MUCOUS RELIEF EXPECTORANT/CONGESTION. DO NOT GET MUCINEX (Timed Release Guaifenesin)  Drink lots of fluids. Keep a lozenge in the mouth all day long.

## 2011-05-26 NOTE — Assessment & Plan Note (Addendum)
Slightly out of control. Distribute his meds as prescribed not all at night. Decrease sweets.  Recheck with PE in Mar. Lab Results  Component Value Date   HGBA1C 8.0* 05/23/2011

## 2011-05-26 NOTE — Assessment & Plan Note (Signed)
Shown exercises to do regularly daily to strengthen back muscles and soft tissues.

## 2011-05-26 NOTE — Progress Notes (Signed)
  Subjective:    Patient ID: Edward Wright, male    DOB: [birth year], 50 y.o.   MRN: [id number]  HPI Pt here for follow up. He had signif back pain the last time I saw him, having manipulated him three times at that point.  He is also diabetic and he is here to discuss that. He has had lows at times in the AM.  He has a cold.     Review of Systems  Constitutional: Negative for fever, chills, diaphoresis, activity change, appetite change and fatigue.       Slightly hoarse.  HENT: Negative for hearing loss, ear pain, congestion, sore throat, rhinorrhea, neck pain, neck stiffness, postnasal drip, sinus pressure, tinnitus and ear discharge.   Eyes: Negative for pain, discharge and visual disturbance.  Respiratory: Negative for cough, shortness of breath and wheezing.   Cardiovascular: Negative for chest pain and palpitations.       No SOB w/ exertion  Gastrointestinal:       No heartburn or swallowing problems.  Genitourinary:       No nocturia  Skin:       No itching or dryness.  Neurological:       No tingling or balance problems.  All other systems reviewed and are negative.       Objective:   Physical Exam  Constitutional: He appears well-developed and well-nourished. No distress.       Slightly hoarse.  HENT:  Head: Normocephalic and atraumatic.  Right Ear: External ear normal.  Left Ear: External ear normal.  Nose: Nose normal.  Mouth/Throat: Oropharynx is clear and moist.       Minimally congested in the sinus distribution.  Eyes: Conjunctivae and EOM are normal. Pupils are equal, round, and reactive to light. Right eye exhibits no discharge. Left eye exhibits no discharge.  Neck: Normal range of motion. Neck supple.  Cardiovascular: Normal rate and regular rhythm.   Pulmonary/Chest: Effort normal and breath sounds normal. He has no wheezes.  Lymphadenopathy:    He has no cervical adenopathy.  Skin: He is not diaphoretic.          Assessment & Plan:

## 2011-07-05 ENCOUNTER — Telehealth: Payer: Self-pay | Admitting: Internal Medicine

## 2011-07-05 DIAGNOSIS — G4733 Obstructive sleep apnea (adult) (pediatric): Secondary | ICD-10-CM

## 2011-07-05 NOTE — Telephone Encounter (Signed)
I spoke with pt and states he is wanting to get a new cpap machine. He states he has had his x 5 years. He would like this sent to [facility]. Please advise Dr. Maple Hudson, thanks

## 2011-07-06 ENCOUNTER — Telehealth: Payer: Self-pay | Admitting: Internal Medicine

## 2011-07-06 NOTE — Telephone Encounter (Signed)
Per CY-ok for script/order to [facility] for replacement CPAP machine and supplies at current pressure. DX OSA   I left message on voicemail for patient that I have placed order and if any questions or concerns to call our office.

## 2011-07-06 NOTE — Telephone Encounter (Signed)
I had called to let pt know that order was sent to [facility] then [facility] for CPAP machine and supplies.

## 2011-08-19 ENCOUNTER — Other Ambulatory Visit: Payer: Self-pay | Admitting: Family Medicine

## 2011-08-19 DIAGNOSIS — N400 Enlarged prostate without lower urinary tract symptoms: Secondary | ICD-10-CM

## 2011-08-19 DIAGNOSIS — E119 Type 2 diabetes mellitus without complications: Secondary | ICD-10-CM

## 2011-08-22 ENCOUNTER — Other Ambulatory Visit: Payer: Self-pay | Admitting: *Deleted

## 2011-08-22 MED ORDER — PRAVASTATIN SODIUM 80 MG PO TABS: 80.0000 mg | ORAL_TABLET | Freq: Every day | ORAL | Status: DC

## 2011-08-22 NOTE — Telephone Encounter (Signed)
Patient has an appt to see Dr. Para March on 08/30/2011.

## 2011-08-23 ENCOUNTER — Other Ambulatory Visit (INDEPENDENT_AMBULATORY_CARE_PROVIDER_SITE_OTHER): Payer: 59

## 2011-08-23 DIAGNOSIS — N400 Enlarged prostate without lower urinary tract symptoms: Secondary | ICD-10-CM

## 2011-08-23 DIAGNOSIS — E119 Type 2 diabetes mellitus without complications: Secondary | ICD-10-CM

## 2011-08-23 LAB — COMPREHENSIVE METABOLIC PANEL
AST: 28 U/L (ref 0–37)
Alkaline Phosphatase: 53 U/L (ref 39–117)
BUN: 15 mg/dL (ref 6–23)
Glucose, Bld: 227 mg/dL — ABNORMAL HIGH (ref 70–99)
Potassium: 4.7 mEq/L (ref 3.5–5.1)
Sodium: 134 mEq/L — ABNORMAL LOW (ref 135–145)
Total Bilirubin: 0.4 mg/dL (ref 0.3–1.2)
Total Protein: 6.7 g/dL (ref 6.0–8.3)

## 2011-08-23 LAB — LIPID PANEL
HDL: 28.6 mg/dL — ABNORMAL LOW (ref >39.00)
LDL Cholesterol: 83 mg/dL (ref 0–99)
VLDL: 29.4 mg/dL (ref 0.0–40.0)

## 2011-08-23 LAB — HEMOGLOBIN A1C: Hgb A1c MFr Bld: 9 % — ABNORMAL HIGH (ref 4.6–6.5)

## 2011-08-23 LAB — PSA: PSA: 0.74 ng/mL (ref 0.10–4.00)

## 2011-08-30 ENCOUNTER — Encounter: Payer: Self-pay | Admitting: Family Medicine

## 2011-08-30 ENCOUNTER — Ambulatory Visit (INDEPENDENT_AMBULATORY_CARE_PROVIDER_SITE_OTHER): Payer: 59 | Admitting: Family Medicine

## 2011-08-30 VITALS — BP 112/60 | HR 91 | Temp 98.6°F | Wt 248.8 lb

## 2011-08-30 DIAGNOSIS — Z1211 Encounter for screening for malignant neoplasm of colon: Secondary | ICD-10-CM

## 2011-08-30 DIAGNOSIS — Z0000 Encounter for general adult medical examination without abnormal findings: Secondary | ICD-10-CM

## 2011-08-30 DIAGNOSIS — N451 Epididymitis: Secondary | ICD-10-CM

## 2011-08-30 DIAGNOSIS — E785 Hyperlipidemia, unspecified: Secondary | ICD-10-CM

## 2011-08-30 DIAGNOSIS — N453 Epididymo-orchitis: Secondary | ICD-10-CM

## 2011-08-30 DIAGNOSIS — E119 Type 2 diabetes mellitus without complications: Secondary | ICD-10-CM

## 2011-08-30 DIAGNOSIS — G4733 Obstructive sleep apnea (adult) (pediatric): Secondary | ICD-10-CM

## 2011-08-30 DIAGNOSIS — N400 Enlarged prostate without lower urinary tract symptoms: Secondary | ICD-10-CM

## 2011-08-30 DIAGNOSIS — G4700 Insomnia, unspecified: Secondary | ICD-10-CM

## 2011-08-30 NOTE — Progress Notes (Signed)
CPE- See plan.  Routine anticipatory guidance given to patient.  See health maintenance.  Diabetes:  Using medications without difficulties: skipping doses Hypoglycemic episodes:no Hyperglycemic episodes:yes, likely  Feet problems: some numbness laterally of R foot but not on the toes or soles Blood Sugars averaging: not checked eye exam within last year: yes  Elevated R testicle.  H/o vasectomy.  Minimal discomfort, postional.   Insomnia, using CPAP.  Sleep cycle is disrupted by spending nights caring for family (his mother) on the weekend.   Elevated Cholesterol: Using medications without problems:yes Muscle aches: no Diet compliance:no Exercise:no  PMH and SH reviewed  Meds, vitals, and allergies reviewed.   ROS: See HPI.  Otherwise negative.    GEN: nad, alert and oriented HEENT: mucous membranes moist NECK: supple w/o LA CV: rrr. PULM: ctab, no inc wob ABD: soft, +bs EXT: no edema SKIN: no acute rash Prostate gland firm and smooth, no enlargement, nodularity, tenderness, mass, asymmetry or induration. Testes bilaterally descended without nodularity but R testicle is suspended higher than left, minimal tenderness on R testicle posteriorly but no masses. No scrotal masses or lesions. No penis lesions or urethral discharge.  Diabetic foot exam: Normal inspection No skin breakdown No calluses  Normal DP pulses Normal sensation to light touch and monofilament except for R foot laterally Nails normal

## 2011-08-30 NOTE — Patient Instructions (Addendum)
We'll contact you with your lab report (stool cards). Come back and see me in 3 months after labs.  Cut back on the sugar and take the metformin 2 tabs twice a day.   Exercise more (walking).

## 2011-09-01 ENCOUNTER — Encounter: Payer: Self-pay | Admitting: Family Medicine

## 2011-09-01 DIAGNOSIS — Z0000 Encounter for general adult medical examination without abnormal findings: Secondary | ICD-10-CM | POA: Insufficient documentation

## 2011-09-01 DIAGNOSIS — G4733 Obstructive sleep apnea (adult) (pediatric): Secondary | ICD-10-CM | POA: Insufficient documentation

## 2011-09-01 DIAGNOSIS — G4700 Insomnia, unspecified: Secondary | ICD-10-CM | POA: Insufficient documentation

## 2011-09-01 NOTE — Assessment & Plan Note (Signed)
Continue meds, work on diet and weight.

## 2011-09-01 NOTE — Assessment & Plan Note (Signed)
Continue cpap.  

## 2011-09-01 NOTE — Assessment & Plan Note (Signed)
Uncontrolled, needs to work on diet and exercise.  F/u 3 months.  He may need insulin.  Discussed, he is aware. >25 min spent with face to face with patient, >50% counseling and/or coordinating care, not counting the CPE today.

## 2011-09-01 NOTE — Assessment & Plan Note (Signed)
Chronic, if worse refer to uro.  No other changes now .

## 2011-09-01 NOTE — Assessment & Plan Note (Signed)
Continue current meds 

## 2011-09-01 NOTE — Assessment & Plan Note (Signed)
Follow clinically

## 2011-09-22 ENCOUNTER — Encounter: Payer: Self-pay | Admitting: Internal Medicine

## 2011-11-24 ENCOUNTER — Other Ambulatory Visit (INDEPENDENT_AMBULATORY_CARE_PROVIDER_SITE_OTHER): Payer: 59

## 2011-11-24 DIAGNOSIS — E119 Type 2 diabetes mellitus without complications: Secondary | ICD-10-CM

## 2011-12-01 ENCOUNTER — Encounter: Payer: Self-pay | Admitting: Family Medicine

## 2011-12-01 ENCOUNTER — Ambulatory Visit (INDEPENDENT_AMBULATORY_CARE_PROVIDER_SITE_OTHER): Payer: 59 | Admitting: Family Medicine

## 2011-12-01 VITALS — BP 108/70 | HR 88 | Temp 98.4°F | Wt 244.8 lb

## 2011-12-01 DIAGNOSIS — E119 Type 2 diabetes mellitus without complications: Secondary | ICD-10-CM

## 2011-12-01 NOTE — Progress Notes (Signed)
Diabetes:  Using medications without difficulties:yes Hypoglycemic episodes:no Hyperglycemic episodes:no Feet problems: no  Blood Sugars averaging: not checked.  A1c much improved to 7.5. He's been working on diet.   He cut out a lot of carbs.    Tick bite ~1 month ago.  It wasn't bloody when he pulled it off.  The site hasn't healed fully but no other rash or fevers or other complaints.    PMH and SH reviewed  Meds, vitals, and allergies reviewed.   ROS: See HPI.  Otherwise negative.    GEN: nad, alert and oriented HEENT: mucous membranes moist NECK: supple w/o LA CV: rrr. PULM: ctab, no inc wob ABD: soft, +bs EXT: no edema R groin with mild local reaction at tick bite site.  No other rash.  No FB seen.   Diabetic foot exam: Normal inspection No skin breakdown No calluses  Normal DP pulses Normal sensation to light touch and monofilament Nails normal

## 2011-12-01 NOTE — Patient Instructions (Addendum)
Recheck A1c in another 4-5 months.  Then come see me after that.  Take care and keep working on your diet/weight.

## 2011-12-02 NOTE — Assessment & Plan Note (Signed)
Improved, continue work on diet and weight.  Continue current meds, cut back to 4 metformin a day.  Recheck later in 2013.  I thanked him for his effort.  The tick bite appears not to be of consequence and he can f/u prn.

## 2012-02-02 ENCOUNTER — Other Ambulatory Visit: Payer: Self-pay | Admitting: Family Medicine

## 2012-03-27 ENCOUNTER — Ambulatory Visit (INDEPENDENT_AMBULATORY_CARE_PROVIDER_SITE_OTHER): Payer: 59 | Admitting: Internal Medicine

## 2012-03-27 ENCOUNTER — Encounter: Payer: Self-pay | Admitting: Internal Medicine

## 2012-03-27 VITALS — BP 138/80 | HR 88 | Ht 72.0 in | Wt 248.8 lb

## 2012-03-27 DIAGNOSIS — G4733 Obstructive sleep apnea (adult) (pediatric): Secondary | ICD-10-CM

## 2012-03-27 DIAGNOSIS — G4700 Insomnia, unspecified: Secondary | ICD-10-CM

## 2012-03-27 NOTE — Progress Notes (Signed)
HPI- 03/22/11- 51 year old male former smoker followed for obstructive sleep apnea/CPAP Last here 04/02/2010. We had increased CPAP to 13 CWP and he would like further increase because he still feels a little tired. Back pain actually disturbs his sleep and I think may be a bigger problem. Bedtime around 10 or 11 PM up at 7 AM. Takes occasional temazepam or zaleplon. Occasional soft drink for caffeine in the morning but little coffee.  03/27/12- 51 year old male former smoker followed for obstructive sleep apnea/CPAP Wears CPAP 14/Advanced every night for about 6-7 hours; pressure works well for patient. Get flu vaccine at work. Occasional Restorl or [place]   ROS- Constitutional:   No-   weight loss, night sweats, fevers, chills,+ fatigue, lassitude. HEENT:   No-  headaches, difficulty swallowing, tooth/dental problems, sore throat,       No-  sneezing, itching, ear ache, nasal congestion, post nasal drip,  CV:  No-   chest pain, orthopnea, PND, swelling in lower extremities, anasarca, dizziness, palpitations Resp: No-   shortness of breath with exertion or at rest.              No-   productive cough,  No non-productive cough,  No-  coughing up of blood.              No-   change in color of mucus.  No- wheezing.   Skin: No-   rash or lesions. GI:  No-   heartburn, indigestion, abdominal pain, nausea, vomiting,  GU:  MS:  No-   joint pain or swelling.  . Neuro- nothing unusual  Psych:  No- change in mood or affect. No depression or anxiety.  No memory loss.  OBJ- General- Alert, Oriented, Affect-appropriate, calm, Distress- none acute, overweight Skin- rash-none, lesions- none, excoriation- none Lymphadenopathy- none Head- atraumatic            Eyes- Gross vision intact, PERRLA, conjunctivae clear secretions            Ears- Hearing, canals-normal            Nose- Clear, no-Septal dev, mucus, polyps, erosion, perforation             Throat- Mallampati III , mucosa clear , drainage-  none, tonsils- atrophic Neck- flexible , trachea midline, no stridor , thyroid nl, carotid no bruit Chest - symmetrical excursion , unlabored           Heart/CV- RRR , no murmur , no gallop  , no rub, nl s1 s2                           - JVD- none , edema- none, stasis changes- none, varices- none           Lung- clear to P&A, wheeze- none, cough- none , dullness-none, rub- none           Chest wall-  Abd-  Br/ Gen/ Rectal- Not done, not indicated Extrem- cyanosis- none, clubbing, none, atrophy- none, strength- nl Neuro- grossly intact to observation

## 2012-03-27 NOTE — Patient Instructions (Addendum)
We can continue CPAP at 14 cwp/ Advanced  Please call as needed

## 2012-04-02 NOTE — Assessment & Plan Note (Signed)
Appropriate use of medication was discussed.

## 2012-04-02 NOTE — Assessment & Plan Note (Signed)
Good compliance and control. No changes necessary. Weight loss advised. Safe driving reemphasized.

## 2012-04-09 ENCOUNTER — Other Ambulatory Visit: Payer: Self-pay | Admitting: Family Medicine

## 2012-04-12 ENCOUNTER — Other Ambulatory Visit: Payer: 59

## 2012-04-13 ENCOUNTER — Other Ambulatory Visit (INDEPENDENT_AMBULATORY_CARE_PROVIDER_SITE_OTHER): Payer: 59

## 2012-04-13 DIAGNOSIS — E119 Type 2 diabetes mellitus without complications: Secondary | ICD-10-CM

## 2012-04-17 ENCOUNTER — Encounter: Payer: Self-pay | Admitting: *Deleted

## 2012-04-17 ENCOUNTER — Other Ambulatory Visit: Payer: 59

## 2012-04-17 LAB — FECAL OCCULT BLOOD, IMMUNOCHEMICAL: Fecal Occult Bld: NEGATIVE

## 2012-04-18 ENCOUNTER — Encounter: Payer: Self-pay | Admitting: Family Medicine

## 2012-04-18 ENCOUNTER — Ambulatory Visit (INDEPENDENT_AMBULATORY_CARE_PROVIDER_SITE_OTHER): Payer: 59 | Admitting: Family Medicine

## 2012-04-18 VITALS — BP 112/70 | HR 94 | Temp 98.3°F | Wt 244.0 lb

## 2012-04-18 DIAGNOSIS — E119 Type 2 diabetes mellitus without complications: Secondary | ICD-10-CM
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
## 2012-04-18 NOTE — Patient Instructions (Signed)
Try to take the metformin twice a day.  Try to work on [employer].   Recheck A1c in 3 months and then come see me after that.  Take care.

## 2012-04-18 NOTE — Assessment & Plan Note (Signed)
He'll work on diet, continue metformin, and recheck A1c in 3 months.  Support offered re: family changes.  He thanked me.

## 2012-05-24 ENCOUNTER — Encounter: Payer: Self-pay | Admitting: Family Medicine

## 2012-05-24 ENCOUNTER — Ambulatory Visit (INDEPENDENT_AMBULATORY_CARE_PROVIDER_SITE_OTHER): Payer: 59 | Admitting: Family Medicine

## 2012-05-24 VITALS — BP 126/70 | HR 99 | Temp 98.3°F | Wt 247.0 lb

## 2012-05-24 DIAGNOSIS — N478 Other disorders of prepuce: Secondary | ICD-10-CM

## 2012-05-24 DIAGNOSIS — M722 Plantar fascial fibromatosis: Secondary | ICD-10-CM

## 2012-05-24 DIAGNOSIS — N471 Phimosis: Secondary | ICD-10-CM

## 2012-05-24 MED ORDER — CLOTRIMAZOLE 1 % EX CREA: TOPICAL_CREAM | Freq: Two times a day (BID) | CUTANEOUS | Status: DC

## 2012-05-24 NOTE — Patient Instructions (Signed)
Stretch your foot before you get up each morning and before you get up from sitting.  Use the clotrimazole until the area is improved and then for 5 more days.

## 2012-05-24 NOTE — Progress Notes (Signed)
Red itchy rash on tip of penis.  Going on for months episodically.  Worse recently.  Tried OTC topical yeast tx with temporary relief.    R foot pain.  Lateral plantar pain.  Some better now.  Some pain with first step, esp in AM.  Better as the day goes on.  No known trauma.    Meds, vitals, and allergies reviewed.   ROS: See HPI.  Otherwise, noncontributory.  nad Distal penis with irritation and mild erythema.  Able to retract foreskin R lateral foot ttp on plantar side but not ttp o/w.  NV intact

## 2012-05-25 DIAGNOSIS — N471 Phimosis: Secondary | ICD-10-CM | POA: Insufficient documentation

## 2012-05-25 DIAGNOSIS — M722 Plantar fascial fibromatosis: Secondary | ICD-10-CM | POA: Insufficient documentation

## 2012-05-25 NOTE — Assessment & Plan Note (Signed)
D/w pt about anatomy and stretching.  Some improvement already and should resolve.

## 2012-05-25 NOTE — Assessment & Plan Note (Signed)
Needs to work on sugar.  Use topical antifungal until resolved and then for a few more days.  F/u prn.  D/w pt about foreskin retraction, if unable to do so then to ER.

## 2012-07-16 ENCOUNTER — Other Ambulatory Visit (INDEPENDENT_AMBULATORY_CARE_PROVIDER_SITE_OTHER): Payer: 59

## 2012-07-16 DIAGNOSIS — E119 Type 2 diabetes mellitus without complications: Secondary | ICD-10-CM

## 2012-07-16 LAB — HEMOGLOBIN A1C: Hgb A1c MFr Bld: 9.9 % — ABNORMAL HIGH (ref 4.6–6.5)

## 2012-07-17 ENCOUNTER — Other Ambulatory Visit: Payer: 59

## 2012-07-23 ENCOUNTER — Ambulatory Visit (INDEPENDENT_AMBULATORY_CARE_PROVIDER_SITE_OTHER): Payer: 59 | Admitting: Family Medicine

## 2012-07-23 ENCOUNTER — Encounter: Payer: Self-pay | Admitting: Family Medicine

## 2012-07-23 VITALS — BP 104/68 | HR 92 | Temp 98.1°F | Wt 246.8 lb

## 2012-07-23 DIAGNOSIS — N50819 Testicular pain, unspecified: Secondary | ICD-10-CM

## 2012-07-23 DIAGNOSIS — N509 Disorder of male genital organs, unspecified: Secondary | ICD-10-CM

## 2012-07-23 DIAGNOSIS — E119 Type 2 diabetes mellitus without complications: Secondary | ICD-10-CM

## 2012-07-23 DIAGNOSIS — N4340 Spermatocele of epididymis, unspecified: Secondary | ICD-10-CM

## 2012-07-23 MED ORDER — METFORMIN HCL 500 MG PO TABS: 1000.0000 mg | ORAL_TABLET | Freq: Two times a day (BID) | ORAL | Status: DC

## 2012-07-23 NOTE — Patient Instructions (Addendum)
See Shirlee Limerick about your referral before you leave today. Get back to taking the metformin morning and night.  Check your sugar episodically.  Drink more water.   Recheck A1c with other labs before a physical in 5/14 months.   Take care.

## 2012-07-23 NOTE — Progress Notes (Signed)
R testicle sore; h/o vasectomy and needs uro referral.    Diabetes:  Using medications without difficulties:  Missing AM doses, doing well with PM doses of metformin.  No ADE.  Hypoglycemic episodes:no sx Hyperglycemic episodes: no sx Feet problems: no  Blood Sugars averaging:  Not checked We talked about options to keep his motivation up.    He has seen a chiropractor about this back with some relief prev, in distant past.  He had an other episode when his back went out around [place] but is improving slowly.    Meds, vitals, and allergies reviewed.   ROS: See HPI.  Otherwise negative.    GEN: nad, alert and oriented HEENT: mucous membranes moist NECK: supple w/o LA CV: rrr. PULM: ctab, no inc wob ABD: soft, +bs EXT: no edema SKIN: no acute rash  Diabetic foot exam: Normal inspection No skin breakdown No calluses  Normal DP pulses Normal sensation to light touch and monofilament Nails normal

## 2012-07-24 ENCOUNTER — Other Ambulatory Visit: Payer: Self-pay | Admitting: Family Medicine

## 2012-07-24 NOTE — Assessment & Plan Note (Signed)
Long discussion about keeping motivation up to keep with a DM2 diet.  He'll start back checking his sugars and use that for reinforcement.  Start back on bid metformin and recheck later in spring of 2014.  He agrees.

## 2012-07-24 NOTE — Assessment & Plan Note (Signed)
Refer to uro

## 2012-10-14 ENCOUNTER — Other Ambulatory Visit: Payer: Self-pay | Admitting: Family Medicine

## 2012-10-14 DIAGNOSIS — N400 Enlarged prostate without lower urinary tract symptoms: Secondary | ICD-10-CM

## 2012-10-14 DIAGNOSIS — E119 Type 2 diabetes mellitus without complications: Secondary | ICD-10-CM

## 2012-10-16 ENCOUNTER — Other Ambulatory Visit (INDEPENDENT_AMBULATORY_CARE_PROVIDER_SITE_OTHER): Payer: 59

## 2012-10-16 DIAGNOSIS — N400 Enlarged prostate without lower urinary tract symptoms: Secondary | ICD-10-CM

## 2012-10-16 DIAGNOSIS — E119 Type 2 diabetes mellitus without complications: Secondary | ICD-10-CM

## 2012-10-16 LAB — HEMOGLOBIN A1C: Hgb A1c MFr Bld: 9.2 % — ABNORMAL HIGH (ref 4.6–6.5)

## 2012-10-16 LAB — LIPID PANEL
HDL: 24.8 mg/dL — ABNORMAL LOW (ref >39.00)
LDL Cholesterol: 78 mg/dL (ref 0–99)
Total CHOL/HDL Ratio: 5
VLDL: 18.8 mg/dL (ref 0.0–40.0)

## 2012-10-16 LAB — COMPREHENSIVE METABOLIC PANEL
ALT: 39 U/L (ref 0–53)
AST: 31 U/L (ref 0–37)
CO2: 29 mEq/L (ref 19–32)
Chloride: 103 mEq/L (ref 96–112)
Creatinine, Ser: 1 mg/dL (ref 0.4–1.5)
Sodium: 140 mEq/L (ref 135–145)
Total Bilirubin: 0.8 mg/dL (ref 0.3–1.2)
Total Protein: 6.9 g/dL (ref 6.0–8.3)

## 2012-10-22 ENCOUNTER — Encounter: Payer: Self-pay | Admitting: Family Medicine

## 2012-10-22 ENCOUNTER — Ambulatory Visit (INDEPENDENT_AMBULATORY_CARE_PROVIDER_SITE_OTHER): Payer: 59 | Admitting: Family Medicine

## 2012-10-22 VITALS — BP 104/70 | HR 84 | Temp 98.1°F | Ht 72.0 in | Wt 240.5 lb

## 2012-10-22 DIAGNOSIS — Z0000 Encounter for general adult medical examination without abnormal findings: Secondary | ICD-10-CM

## 2012-10-22 DIAGNOSIS — E119 Type 2 diabetes mellitus without complications: Secondary | ICD-10-CM

## 2012-10-22 DIAGNOSIS — Z1211 Encounter for screening for malignant neoplasm of colon: Secondary | ICD-10-CM

## 2012-10-22 DIAGNOSIS — E785 Hyperlipidemia, unspecified: Secondary | ICD-10-CM

## 2012-10-22 NOTE — Assessment & Plan Note (Signed)
Controlled, continue current meds.   

## 2012-10-22 NOTE — Progress Notes (Signed)
CPE- See plan.  Routine anticipatory guidance given to patient.  See health maintenance. Tetanus 2011 Flu shot yearly D/w patient [id number] for colon cancer screening, including IFOB vs. colonoscopy.  Risks and benefits of both were discussed and patient voiced understanding.  Pt elects for: IFOB.   PSA per uro.  Living will- has one.  Wife would be designated if incapacitated.   Elevated Cholesterol: Using medications without problems:yes Muscle aches: no Diet compliance: yes Exercise: walking  Diabetes:  Using medications without difficulties:yes Hypoglycemic episodes:rare sx Hyperglycemic episodes:no sx Feet problems:no Blood Sugars averaging: not checked recently.   He's working on diet. A1c down to 9.2   PMH and SH reviewed  Meds, vitals, and allergies reviewed.   ROS: See HPI.  Otherwise negative.    GEN: nad, alert and oriented HEENT: mucous membranes moist NECK: supple w/o LA CV: rrr. PULM: ctab, no inc wob ABD: soft, +bs EXT: no edema SKIN: no acute rash  Diabetic foot exam: Normal inspection No skin breakdown No calluses  Normal DP pulses Normal sensation to light touch and monofilament Nails normal

## 2012-10-22 NOTE — Assessment & Plan Note (Signed)
Improved, but not controlled. Will continue current meds for now and recheck in ~4 months.

## 2012-10-22 NOTE — Assessment & Plan Note (Addendum)
  Healthy habits encouraged.  Routine anticipatory guidance given to patient.  See health maintenance. Tetanus 2011 Flu shot yearly D/w patient [id number] for colon cancer screening, including IFOB vs. colonoscopy.  Risks and benefits of both were discussed and patient voiced understanding.  Pt elects for: IFOB.   PSA per uro.  Living will- has one.  Wife would be designated if incapacitated.

## 2012-10-22 NOTE — Patient Instructions (Addendum)
Go to the lab on the way out.  We'll contact you with your lab report. Keep working on your diet and recheck A1c in about 4 months before a visit.  Take care.

## 2012-10-25 ENCOUNTER — Other Ambulatory Visit (INDEPENDENT_AMBULATORY_CARE_PROVIDER_SITE_OTHER): Payer: 59

## 2012-10-25 ENCOUNTER — Encounter: Payer: Self-pay | Admitting: *Deleted

## 2012-10-25 DIAGNOSIS — Z1211 Encounter for screening for malignant neoplasm of colon: Secondary | ICD-10-CM

## 2012-10-25 LAB — FECAL OCCULT BLOOD, IMMUNOCHEMICAL: Fecal Occult Bld: NEGATIVE

## 2012-12-01 ENCOUNTER — Other Ambulatory Visit: Payer: Self-pay | Admitting: Family Medicine

## 2012-12-03 NOTE — Telephone Encounter (Signed)
Electronic refill request.  Please advise. 

## 2012-12-04 NOTE — Telephone Encounter (Signed)
Sent!

## 2012-12-27 ENCOUNTER — Other Ambulatory Visit: Payer: Self-pay

## 2013-02-15 ENCOUNTER — Ambulatory Visit (INDEPENDENT_AMBULATORY_CARE_PROVIDER_SITE_OTHER): Payer: 59 | Admitting: Family Medicine

## 2013-02-15 ENCOUNTER — Encounter: Payer: Self-pay | Admitting: Family Medicine

## 2013-02-15 VITALS — BP 112/70 | HR 92 | Temp 98.2°F | Wt 243.2 lb

## 2013-02-15 DIAGNOSIS — M792 Neuralgia and neuritis, unspecified: Secondary | ICD-10-CM | POA: Insufficient documentation

## 2013-02-15 DIAGNOSIS — IMO0002 Reserved for concepts with insufficient information to code with codable children: Secondary | ICD-10-CM

## 2013-02-15 MED ORDER — METHOCARBAMOL 500 MG PO TABS: 500.0000 mg | ORAL_TABLET | Freq: Four times a day (QID) | ORAL | Status: DC

## 2013-02-15 NOTE — Patient Instructions (Addendum)
Try 1-2 aleve up to twice a day with food.  Take robaxin for the muscle tightness.   If not better, call me.

## 2013-02-15 NOTE — Assessment & Plan Note (Signed)
~  C7 dist.  With paresthesia, but no weakness.  Change to aleve, avoid pred given DM2 hx.  Change to robaxin.  Consider MR if not improving or weakness.  He agrees.

## 2013-02-15 NOTE — Progress Notes (Signed)
2 weeks ago, he had a pain down the L arm, into the fingers on the L hand.  Felt likely "1000 needles" in the L arm.  It got some better with ibuprofen ~800 tid.  Saturday he was burying a line for an invisible fence.  No pain at the time. Since then, he has more sx, ie numbness on digits 1-3 on the L hand.  Numbness on the 2nd digit most bothersome.  Pain in L upper back.  No R arm sx.  He has had some R sided sciatica prev, but this was isolated and not recurrent.    No CP, SOB.    Meds, vitals, and allergies reviewed.   ROS: See HPI.  Otherwise, noncontributory.  nad ncat Mmm rrr ctab Neck supple, pain with ROM but not stiff L shoulder with normal ROM, no cuff findings Dec sens to monofilament on dorsum on L 2nd finger. Vibration, temp sens wnl. DTRS wnl for the BUE No weakness.  Muscle tightness in L upper back, paraspinal area No rash Normal radial pulses x2 Normal BLE DTRs

## 2013-02-19 ENCOUNTER — Other Ambulatory Visit: Payer: 59

## 2013-02-22 ENCOUNTER — Ambulatory Visit: Payer: 59 | Admitting: Family Medicine

## 2013-02-26 ENCOUNTER — Other Ambulatory Visit (INDEPENDENT_AMBULATORY_CARE_PROVIDER_SITE_OTHER): Payer: 59

## 2013-02-26 DIAGNOSIS — E119 Type 2 diabetes mellitus without complications: Secondary | ICD-10-CM

## 2013-03-01 ENCOUNTER — Ambulatory Visit (INDEPENDENT_AMBULATORY_CARE_PROVIDER_SITE_OTHER): Payer: 59 | Admitting: Family Medicine

## 2013-03-01 ENCOUNTER — Encounter: Payer: Self-pay | Admitting: Family Medicine

## 2013-03-01 VITALS — BP 110/64 | HR 80 | Temp 98.4°F | Wt 243.0 lb

## 2013-03-01 DIAGNOSIS — E119 Type 2 diabetes mellitus without complications: Secondary | ICD-10-CM

## 2013-03-01 DIAGNOSIS — IMO0002 Reserved for concepts with insufficient information to code with codable children: Secondary | ICD-10-CM

## 2013-03-01 DIAGNOSIS — M792 Neuralgia and neuritis, unspecified: Secondary | ICD-10-CM

## 2013-03-01 MED ORDER — SITAGLIPTIN PHOSPHATE 100 MG PO TABS: 100.0000 mg | ORAL_TABLET | Freq: Every day | ORAL | Status: DC

## 2013-03-01 NOTE — Progress Notes (Signed)
Labs OV note reviewed.    His neck pain is some better with ice.  He still has numbness on the L hand and some radicular pain.  He can feel with the L hand but it doesn't have normal sensation on the 1st and 2nd digit.  Tip of the 2nd digit is most affected.  We talked about imaging at this point.    Diabetes:  Using medications without difficulties:yes, occ skipped metformin.  Hypoglycemic episodes:no Hyperglycemic episodes:no Feet problems:no  PMH and SH reviewed  Meds, vitals, and allergies reviewed.   ROS: See HPI.  Otherwise negative.    GEN: nad, alert and oriented HEENT: mucous membranes moist NECK: supple w/o LA CV: rrr. PULM: ctab, no inc wob ABD: soft, +bs EXT: no edema SKIN: no acute rash Still with dec in sensation in the L 2nd digit, no motor changes.

## 2013-03-01 NOTE — Patient Instructions (Addendum)
Shirlee Limerick will call about your referral for the MRI.  We'll be in touch about the pictures.   Start the [place] one a day.  Keep taking the metformin and work on [employer].  Take care.  Recheck A1c in about 3 months.

## 2013-03-02 ENCOUNTER — Encounter: Payer: Self-pay | Admitting: Family Medicine

## 2013-03-02 NOTE — Assessment & Plan Note (Signed)
Not controlled, needs diet and exercise, weight loss, add on januvia and recheck A1c in about 3 months.  He agrees.  Labs d/w pt.

## 2013-03-02 NOTE — Assessment & Plan Note (Signed)
With persistent dec in sensation on the L 2nd digit, would proceed with MR at this point.  Continue icing neck in meantime. He agrees.

## 2013-03-05 ENCOUNTER — Telehealth: Payer: Self-pay

## 2013-03-05 MED ORDER — GLUCOSE BLOOD VI STRP: ORAL_STRIP | Status: DC

## 2013-03-05 NOTE — Telephone Encounter (Signed)
Dr Para March advised to send test strips to pharmacy.Done. Pt does not feel like he needs to go to UC or ED now. Pt [facility] test [facility] from [facility] and test BS. If BS extremely high or low with new test strips or if pt condition changes or worsens pt will go to UC or ED otherwise pt will call in AM with update on condition and BS reading.

## 2013-03-05 NOTE — Telephone Encounter (Signed)
Pt request refill one touch ultra test strips; pt used to test blood sugar when saw Dr Hetty Ely. Pt was just started on Januvia and  Pt checked sugar today around 3:30 and BS 500 but pts test strips expired on 2008. Now pt feels better but prior to lunch pt was sweaty and feeling very tired. Now pt is still sweaty , tired and h/a.Please advise.

## 2013-03-06 NOTE — Telephone Encounter (Signed)
Noted, thanks, then I would just continue as planned.  Thanks for the update.

## 2013-03-06 NOTE — Telephone Encounter (Signed)
Patient notified as instructed by telephone. 

## 2013-03-06 NOTE — Telephone Encounter (Signed)
Pt left v/m did BS last night; BS 128 and this AM FBS 163. Spoke with pt and today pt feels good.

## 2013-03-12 ENCOUNTER — Ambulatory Visit
Admission: RE | Admit: 2013-03-12 | Discharge: 2013-03-12 | Disposition: A | Discharge status: Home / Self Care | Payer: 59 | Source: Ambulatory Visit | Attending: Family Medicine | Admitting: Family Medicine

## 2013-03-12 ENCOUNTER — Other Ambulatory Visit: Payer: Self-pay | Admitting: Family Medicine

## 2013-03-12 DIAGNOSIS — M792 Neuralgia and neuritis, unspecified: Secondary | ICD-10-CM

## 2013-03-12 DIAGNOSIS — M5412 Radiculopathy, cervical region: Secondary | ICD-10-CM

## 2013-03-14 ENCOUNTER — Other Ambulatory Visit: Payer: Self-pay | Admitting: Family Medicine

## 2013-03-28 ENCOUNTER — Telehealth: Payer: Self-pay

## 2013-03-28 MED ORDER — SAXAGLIPTIN HCL 5 MG PO TABS: 5.0000 mg | ORAL_TABLET | Freq: Every day | ORAL | Status: DC

## 2013-03-28 NOTE — Telephone Encounter (Signed)
Change to onglyza.  Thanks.  rx sent. Continue as planned o/w.

## 2013-03-28 NOTE — Telephone Encounter (Signed)
Pt left v/m; pt got letter that the cost of Januvia was going up and pt request changing to alternate med.Please advise.

## 2013-03-28 NOTE — Telephone Encounter (Signed)
Pt called back and uses [facility] [place]. Pt said [facility] sent letter and said possible alternate meds could be Onglyza,Tradjenta or Nesina. Pt request cb. Next week will be OK.

## 2013-04-01 ENCOUNTER — Encounter: Payer: Self-pay | Admitting: Internal Medicine

## 2013-04-01 ENCOUNTER — Ambulatory Visit (INDEPENDENT_AMBULATORY_CARE_PROVIDER_SITE_OTHER): Payer: 59 | Admitting: Internal Medicine

## 2013-04-01 VITALS — BP 120/82 | HR 97 | Ht 72.0 in | Wt 246.8 lb

## 2013-04-01 DIAGNOSIS — G4733 Obstructive sleep apnea (adult) (pediatric): Secondary | ICD-10-CM

## 2013-04-01 NOTE — Progress Notes (Signed)
HPI- 03/22/11- 52 year old male former smoker followed for obstructive sleep apnea/CPAP Last here 04/02/2010. We had increased CPAP to 13 CWP and he would like further increase because he still feels a little tired. Back pain actually disturbs his sleep and I think may be a bigger problem. Bedtime around 10 or 11 PM up at 7 AM. Takes occasional temazepam or zaleplon. Occasional soft drink for caffeine in the morning but little coffee.  03/27/12- 52 year old male former smoker followed for obstructive sleep apnea/CPAP Wears CPAP 14/Advanced every night for about 6-7 hours; pressure works well for patient. Get flu vaccine at work. Occasional Restorl or [place]  04/01/13- 52 year old male former smoker followed for obstructive sleep apnea/CPAP FOLLOWS FOR: wears CPAP 13/ Advanced every night for about 6-7 hours; DME is [facility] Asks replacement mask. We discussed his upcoming C-spine surgery, explaining that placement of bone or metal may press on the airway and affect his sleep apnea.  ROS- see HPI Constitutional:   No-   weight loss, night sweats, fevers, chills,  fatigue, lassitude. HEENT:   No-  headaches, difficulty swallowing, tooth/dental problems, sore throat,       No-  sneezing, itching, ear ache, nasal congestion, post nasal drip,  CV:  No-   chest pain, orthopnea, PND, swelling in lower extremities, anasarca, dizziness, palpitations Resp: No-   shortness of breath with exertion or at rest.              No-   productive cough,  No non-productive cough,  No-  coughing up of blood.              No-   change in color of mucus.  No- wheezing.   Skin: No-   rash or lesions. GI:  No-   heartburn, indigestion, abdominal pain, nausea, vomiting,  GU:  MS:  No-   joint pain or swelling.  . Neuro- nothing unusual  Psych:  No- change in mood or affect. No depression or anxiety.  No memory loss.  OBJ- General- Alert, Oriented, Affect-appropriate, calm, Distress- none acute, overweight Skin-  rash-none, lesions- none, excoriation- none Lymphadenopathy- none Head- atraumatic            Eyes- Gross vision intact, PERRLA, conjunctivae clear secretions            Ears- Hearing, canals-normal            Nose- Clear, no-Septal dev, mucus, polyps, erosion, perforation             Throat- Mallampati III , mucosa clear , drainage- none, tonsils- atrophic Neck- flexible , trachea midline, no stridor , thyroid nl, carotid no bruit Chest - symmetrical excursion , unlabored           Heart/CV- RRR , no murmur , no gallop  , no rub, nl s1 s2                           - JVD- none , edema- none, stasis changes- none, varices- none           Lung- clear to P&A, wheeze- none, cough- none , dullness-none, rub- none           Chest wall-  Abd-  Br/ Gen/ Rectal- Not done, not indicated Extrem- cyanosis- none, clubbing, none, atrophy- none, strength- nl Neuro- grossly intact to observation

## 2013-04-01 NOTE — Patient Instructions (Signed)
Order- [facility]- replacement CPAP mask of choice and supplies as needed   Dx OSA

## 2013-04-14 NOTE — Assessment & Plan Note (Addendum)
CPAP pressure is 13. We will verify. Needs replacement mask.

## 2013-04-24 ENCOUNTER — Other Ambulatory Visit: Payer: Self-pay | Admitting: Family Medicine

## 2013-05-23 ENCOUNTER — Other Ambulatory Visit (INDEPENDENT_AMBULATORY_CARE_PROVIDER_SITE_OTHER): Payer: 59

## 2013-05-23 DIAGNOSIS — E119 Type 2 diabetes mellitus without complications: Secondary | ICD-10-CM

## 2013-05-23 LAB — HEMOGLOBIN A1C: Hgb A1c MFr Bld: 8 % — ABNORMAL HIGH (ref 4.6–6.5)

## 2013-05-30 ENCOUNTER — Encounter: Payer: Self-pay | Admitting: Family Medicine

## 2013-05-30 ENCOUNTER — Ambulatory Visit (INDEPENDENT_AMBULATORY_CARE_PROVIDER_SITE_OTHER): Payer: 59 | Admitting: Family Medicine

## 2013-05-30 VITALS — BP 102/68 | HR 83 | Temp 97.8°F | Wt 238.0 lb

## 2013-05-30 DIAGNOSIS — Z23 Encounter for immunization: Secondary | ICD-10-CM

## 2013-05-30 DIAGNOSIS — E119 Type 2 diabetes mellitus without complications: Secondary | ICD-10-CM

## 2013-05-30 NOTE — Progress Notes (Signed)
Pre-visit discussion using our clinic review tool. No additional management support is needed unless otherwise documented below in the visit note.  L arm and hand sx are resolved.  MR d/w pt.   Recently with URI, resolved now.   Diabetes:  Using medications without difficulties:yes Hypoglycemic episodes:rare Hyperglycemic episodes:no Feet problems:no Blood Sugars averaging: ~200 eye exam within last year:yes, due for f/u in 06/2013 A1c improved to 8.   MALB due.  D/w pt.   Diet is better than exercise; d/w pt.   Meds, vitals, and allergies reviewed.   ROS: See HPI.  Otherwise negative.    GEN: nad, alert and oriented HEENT: mucous membranes moist NECK: supple w/o LA CV: rrr. PULM: ctab, no inc wob ABD: soft, +bs EXT: no edema SKIN: no acute rash

## 2013-05-30 NOTE — Assessment & Plan Note (Signed)
Continue meds, work on diet and weight.  MALB pending, ACE d/w pt. He understood.  PNA vaccine today.  Flu shot prev done.  Fu prn.

## 2013-05-30 NOTE — Patient Instructions (Signed)
Go to the lab on the way out.  We'll contact you with your lab report.  Keep working on your weight.  Don't change your meds.   Schedule a physical with labs ahead of time for ~09/2013.  Take care.  Glad to see you.

## 2013-05-31 LAB — MICROALBUMIN / CREATININE URINE RATIO: Microalb, Ur: 0.5 mg/dL (ref 0.0–1.9)

## 2013-08-13 ENCOUNTER — Other Ambulatory Visit: Payer: Self-pay | Admitting: Family Medicine

## 2013-10-08 ENCOUNTER — Other Ambulatory Visit: Payer: Self-pay | Admitting: Family Medicine

## 2013-11-16 ENCOUNTER — Other Ambulatory Visit: Payer: Self-pay | Admitting: Family Medicine

## 2013-12-08 ENCOUNTER — Other Ambulatory Visit: Payer: Self-pay | Admitting: Family Medicine

## 2013-12-08 DIAGNOSIS — E119 Type 2 diabetes mellitus without complications: Secondary | ICD-10-CM

## 2013-12-11 ENCOUNTER — Other Ambulatory Visit (INDEPENDENT_AMBULATORY_CARE_PROVIDER_SITE_OTHER): Payer: 59

## 2013-12-11 DIAGNOSIS — E119 Type 2 diabetes mellitus without complications: Secondary | ICD-10-CM

## 2013-12-11 LAB — COMPREHENSIVE METABOLIC PANEL
ALBUMIN: 4.2 g/dL (ref 3.5–5.2)
ALK PHOS: 42 U/L (ref 39–117)
ALT: 27 U/L (ref 0–53)
AST: 26 U/L (ref 0–37)
BUN: 22 mg/dL (ref 6–23)
CALCIUM: 9.3 mg/dL (ref 8.4–10.5)
CHLORIDE: 106 meq/L (ref 96–112)
CO2: 26 meq/L (ref 19–32)
Creatinine, Ser: 1.1 mg/dL (ref 0.4–1.5)
GFR: 77.13 mL/min (ref >60.00)
GLUCOSE: 184 mg/dL — AB (ref 70–99)
POTASSIUM: 4.3 meq/L (ref 3.5–5.1)
SODIUM: 139 meq/L (ref 135–145)
TOTAL PROTEIN: 7 g/dL (ref 6.0–8.3)
Total Bilirubin: 0.4 mg/dL (ref 0.2–1.2)

## 2013-12-11 LAB — HEMOGLOBIN A1C: Hgb A1c MFr Bld: 8.1 % — ABNORMAL HIGH (ref 4.6–6.5)

## 2013-12-11 LAB — LIPID PANEL
CHOLESTEROL: 140 mg/dL (ref 0–200)
HDL: 26.8 mg/dL — ABNORMAL LOW (ref >39.00)
LDL Cholesterol: 80 mg/dL (ref 0–99)
NonHDL: 113.2
Total CHOL/HDL Ratio: 5
Triglycerides: 166 mg/dL — ABNORMAL HIGH (ref 0.0–149.0)
VLDL: 33.2 mg/dL (ref 0.0–40.0)

## 2013-12-18 ENCOUNTER — Encounter: Payer: Self-pay | Admitting: Family Medicine

## 2013-12-18 ENCOUNTER — Ambulatory Visit (INDEPENDENT_AMBULATORY_CARE_PROVIDER_SITE_OTHER): Payer: 59 | Admitting: Family Medicine

## 2013-12-18 VITALS — BP 110/66 | HR 84 | Temp 98.5°F | Ht 71.5 in | Wt 237.8 lb

## 2013-12-18 DIAGNOSIS — G4700 Insomnia, unspecified: Secondary | ICD-10-CM

## 2013-12-18 DIAGNOSIS — E785 Hyperlipidemia, unspecified: Secondary | ICD-10-CM

## 2013-12-18 DIAGNOSIS — Z7189 Other specified counseling: Secondary | ICD-10-CM

## 2013-12-18 DIAGNOSIS — Z0000 Encounter for general adult medical examination without abnormal findings: Secondary | ICD-10-CM

## 2013-12-18 DIAGNOSIS — Z1211 Encounter for screening for malignant neoplasm of colon: Secondary | ICD-10-CM

## 2013-12-18 DIAGNOSIS — R1310 Dysphagia, unspecified: Secondary | ICD-10-CM | POA: Insufficient documentation

## 2013-12-18 DIAGNOSIS — R1319 Other dysphagia: Secondary | ICD-10-CM

## 2013-12-18 DIAGNOSIS — E119 Type 2 diabetes mellitus without complications: Secondary | ICD-10-CM

## 2013-12-18 MED ORDER — CLOTRIMAZOLE 1 % EX CREA: TOPICAL_CREAM | CUTANEOUS | Status: DC

## 2013-12-18 NOTE — Assessment & Plan Note (Signed)
Routine anticipatory guidance given to patient.  See health maintenance. Tetanus 2011 Flu shot prev done.  PNA 2014 Shingles shot d/w pt.  Can get at 50.   D/w patient [id number] for colon cancer screening, including IFOB vs. colonoscopy.  Risks and benefits of both were discussed and patient voiced understanding.  Pt elects for: colonoscopy.  Prostate cancer screening and PSA options (with potential risks and benefits of testing vs not testing) were discussed along with recent recs/guidelines.  He declined testing PSA at this point.   Living will d/w pt.  Wife is designated if patient is incapacitated.  Diet and exercise d/w pt.  Walking for exercise.  Weight is down a little.   Discussed tobacco cessation.  He'll consider.

## 2013-12-18 NOTE — Assessment & Plan Note (Signed)
He is working through the grief of his mother's death, insomnia improved in the meantime, needing meds less now.

## 2013-12-18 NOTE — Assessment & Plan Note (Signed)
Controlled except for TG, continue statin.  Labs d/w pt.

## 2013-12-18 NOTE — Progress Notes (Signed)
Pre visit review using our clinic review tool, if applicable. No additional management support is needed unless otherwise documented below in the visit note.  CPE- See plan.  Routine anticipatory guidance given to patient.  See health maintenance. Tetanus 2011 Flu shot prev done.  PNA 2014 Shingles shot d/w pt.  Can get at 24.   D/w patient TU:UEKCMKL for colon cancer screening, including IFOB vs. colonoscopy.  Risks and benefits of both were discussed and patient voiced understanding.  Pt elects for: colonoscopy.  Prostate cancer screening and PSA options (with potential risks and benefits of testing vs not testing) were discussed along with recent recs/guidelines.  He declined testing PSA at this point.   Living will d/w pt.  Wife is designated if patient is incapacitated.  Diet and exercise d/w pt.  Walking for exercise.  Weight is down a little.   Discussed tobacco cessation.  He'll consider.    Diabetes:  Using medications without difficulties:yes Hypoglycemic episodes:no Hyperglycemic episodes:no Feet problems:no Blood Sugars averaging:120-160 or lower in AM eye exam within last year: done 6 months ago.  Normal exam per patient report.   Cutting back on carbs, soft drinks.   A1c 8.1.   Elevated Cholesterol: Using medications without problems: yes Muscle aches: no Diet compliance:see above Exercise:some  Occ feeling of food sticking when eating. Getting progressively worse.  Not food sticking but has sensation of slow transit.   Seeing Dr. Joya Salm about his neck, didn't need surgery.  Taking valium and tramadol prn, with relief and no ADE.    PMH and SH reviewed  Meds, vitals, and allergies reviewed.   ROS: See HPI.  Otherwise negative.    GEN: nad, alert and oriented HEENT: mucous membranes moist NECK: supple w/o LA CV: rrr. PULM: ctab, no inc wob ABD: soft, +bs EXT: no edema SKIN: no acute rash  Diabetic foot exam: Normal inspection No skin breakdown No  calluses  Normal DP pulses Normal sensation to light touch and monofilament Nails normal

## 2013-12-18 NOTE — Patient Instructions (Addendum)
I would get a flu shot each fall.   Edward Wright will call about your referral. Recheck A1c in about 3 months before a visit.  Take care.  Try to work on your weight in the meantime.  Glad to see you.

## 2013-12-18 NOTE — Assessment & Plan Note (Signed)
No emergent sx.  Referring to GI in the meantime for colon cancer screening, will ask for GI input.  He agrees.

## 2013-12-18 NOTE — Assessment & Plan Note (Signed)
He'll work on diet and continue meds, recheck in a few months.  He agrees.  D/w pt.

## 2014-01-06 ENCOUNTER — Encounter: Payer: Self-pay | Admitting: Internal Medicine

## 2014-02-14 ENCOUNTER — Encounter: Payer: 59 | Admitting: Internal Medicine

## 2014-03-01 ENCOUNTER — Other Ambulatory Visit: Payer: Self-pay | Admitting: Family Medicine

## 2014-03-07 ENCOUNTER — Ambulatory Visit (AMBULATORY_SURGERY_CENTER): Payer: Self-pay | Admitting: *Deleted

## 2014-03-07 ENCOUNTER — Telehealth: Payer: Self-pay | Admitting: *Deleted

## 2014-03-07 VITALS — Ht 71.0 in | Wt 247.4 lb

## 2014-03-07 DIAGNOSIS — Z1211 Encounter for screening for malignant neoplasm of colon: Secondary | ICD-10-CM

## 2014-03-07 MED ORDER — MOVIPREP 100 G PO SOLR: ORAL | Status: DC

## 2014-03-07 NOTE — Progress Notes (Signed)
No allergies to eggs or soy. No problems with anesthesia.  Pt given Emmi instructions for colonoscopy  No oxygen use  No diet drug use  

## 2014-03-07 NOTE — Telephone Encounter (Signed)
Dr Henrene Pastor:  Pt is scheduled for screening colonoscopy Friday 03/21/2014.  During PV pt says he has been having some problems with dysphagia.  He says it feels like "something is getting stuck when he swallows food"  This happens about twice a month and has been going on for 1 year.  Pt takes Zantac.  I have scheduled him for OV with you 11/20.  Is that ok or do you want to talk to him when he comes for colonoscopy?  Please advise.  Thanks, Juliann Pulse

## 2014-03-08 NOTE — Telephone Encounter (Signed)
Keep colonoscopy then office visit as planned. Thanks

## 2014-03-10 NOTE — Telephone Encounter (Signed)
Pt aware to proceed with colonoscopy as scheduled and OV 11/20.

## 2014-03-16 ENCOUNTER — Other Ambulatory Visit: Payer: Self-pay | Admitting: Family Medicine

## 2014-03-16 DIAGNOSIS — E119 Type 2 diabetes mellitus without complications: Secondary | ICD-10-CM

## 2014-03-17 ENCOUNTER — Encounter: Payer: Self-pay | Admitting: Internal Medicine

## 2014-03-20 ENCOUNTER — Other Ambulatory Visit (INDEPENDENT_AMBULATORY_CARE_PROVIDER_SITE_OTHER): Payer: 59

## 2014-03-20 DIAGNOSIS — E119 Type 2 diabetes mellitus without complications: Secondary | ICD-10-CM

## 2014-03-20 LAB — HEMOGLOBIN A1C: Hgb A1c MFr Bld: 8.9 % — ABNORMAL HIGH (ref 4.6–6.5)

## 2014-03-21 ENCOUNTER — Encounter: Payer: Self-pay | Admitting: Internal Medicine

## 2014-03-21 ENCOUNTER — Ambulatory Visit (AMBULATORY_SURGERY_CENTER): Payer: 59 | Admitting: Internal Medicine

## 2014-03-21 VITALS — BP 119/83 | HR 67 | Temp 97.9°F | Resp 11 | Ht 71.0 in | Wt 247.0 lb

## 2014-03-21 DIAGNOSIS — D122 Benign neoplasm of ascending colon: Secondary | ICD-10-CM

## 2014-03-21 DIAGNOSIS — D125 Benign neoplasm of sigmoid colon: Secondary | ICD-10-CM

## 2014-03-21 DIAGNOSIS — Z1211 Encounter for screening for malignant neoplasm of colon: Secondary | ICD-10-CM

## 2014-03-21 DIAGNOSIS — D120 Benign neoplasm of cecum: Secondary | ICD-10-CM

## 2014-03-21 DIAGNOSIS — D123 Benign neoplasm of transverse colon: Secondary | ICD-10-CM

## 2014-03-21 DIAGNOSIS — D124 Benign neoplasm of descending colon: Secondary | ICD-10-CM

## 2014-03-21 MED ORDER — SODIUM CHLORIDE 0.9 % IV SOLN: 500.0000 mL | INTRAVENOUS | Status: DC

## 2014-03-21 NOTE — Op Note (Signed)
[place]  [address] [place], [address]   COLONOSCOPY PROCEDURE REPORT  PATIENT: Edward Wright, Edward Wright  MR#: [id number] BIRTHDATE: 1960-08-08 , 7  yrs. old GENDER: male ENDOSCOPIST: Eustace Quail, MD REFERRED BU:LAGTXM Duncan, M.D. PROCEDURE DATE:  03/21/2014 PROCEDURE:   Colonoscopy with snare polypectomy x 8 First Screening Colonoscopy - Avg.  risk and is 50 yrs.  old or older Yes.  Prior Negative Screening - Now for repeat screening. N/A  History of Adenoma - Now for follow-up colonoscopy & has been > or = to 3 yrs.  N/A  Polyps Removed Today? Yes. ASA CLASS:   Class II INDICATIONS:average risk for colorectal cancer. MEDICATIONS: Monitored anesthesia care and Propofol 400 mg IV  DESCRIPTION OF PROCEDURE:   After the risks benefits and alternatives of the procedure were thoroughly explained, informed consent was obtained.  The digital rectal exam revealed no abnormalities of the rectum.   The LB [id number]  endoscope was introduced through the anus and advanced to the cecum, which was identified by both the appendix and ileocecal valve. No adverse events experienced.   The quality of the prep was excellent, using MoviPrep  The instrument was then slowly withdrawn as the colon was fully examined.   COLON FINDINGS: Eight polyps ranging between 3-60mm in size were found in the descending (2), sigmoid , transverse (3),  cecum, and ascending colon.  A polypectomy was performed with a cold snare. The resection was complete, the polyp tissue was completely retrieved and sent to histology.   There was moderate diverticulosis noted in the sigmoid colon.   The examination was otherwise normal.  Retroflexed views revealed internal hemorrhoids. The time to cecum=2 minutes 16 seconds.  Withdrawal time=17 minutes 43 seconds.  The scope was withdrawn and the procedure completed. COMPLICATIONS: There were no immediate complications.  ENDOSCOPIC IMPRESSION: 1.    Eight polyps were found in the colon; polypectomy was performed with a cold snare 2.   Moderate diverticulosis was noted in the sigmoid colon 3.   The examination was otherwise normal  RECOMMENDATIONS: 1. Repeat Colonoscopy in 3 years. 2. Set up EGD with possible dilation in [place] "intermittent solid and pill dysphagia".  Cancel office visit 05-09-14  eSigned:  Eustace Quail, MD 03/21/2014 9:27 AM   cc: Elsie Stain, MD and The Patient   PATIENT NAME:  Edward Wright, Edward Wright MR#: [id number]

## 2014-03-21 NOTE — Progress Notes (Signed)
Called to room to assist during endoscopic procedure.  Patient ID and intended procedure confirmed with present staff. Received instructions for my participation in the procedure from the performing physician.  

## 2014-03-21 NOTE — Progress Notes (Signed)
A/ox3 pleased with MAC, report to Jane RN 

## 2014-03-21 NOTE — Patient Instructions (Addendum)
YOU HAD AN ENDOSCOPIC PROCEDURE TODAY AT THE [facility]: Refer to the procedure report that was given to you for any specific questions about what was found during the examination.  If the procedure report does not answer your questions, please call your gastroenterologist to clarify.  If you requested that your care partner not be given the details of your procedure findings, then the procedure report has been included in a sealed envelope for you to review at your convenience later.  YOU SHOULD EXPECT: Some feelings of bloating in the abdomen. Passage of more gas than usual.  Walking can help get rid of the air that was put into your GI tract during the procedure and reduce the bloating. If you had a lower endoscopy (such as a colonoscopy or flexible sigmoidoscopy) you may notice spotting of blood in your stool or on the toilet paper. If you underwent a bowel prep for your procedure, then you may not have a normal bowel movement for a few days.  DIET: Your first meal following the procedure should be a light meal and then it is ok to progress to your normal diet.  A half-sandwich or bowl of soup is an example of a good first meal.  Heavy or fried foods are harder to digest and may make you feel nauseous or bloated.  Likewise meals heavy in dairy and vegetables can cause extra gas to form and this can also increase the bloating.  Drink plenty of fluids but you should avoid alcoholic beverages for 24 hours.  ACTIVITY: Your care partner should take you home directly after the procedure.  You should plan to take it easy, moving slowly for the rest of the day.  You can resume normal activity the day after the procedure however you should NOT DRIVE or use heavy machinery for 24 hours (because of the sedation medicines used during the test).    SYMPTOMS TO REPORT IMMEDIATELY: A gastroenterologist can be reached at any hour.  During normal business hours, 8:30 AM to 5:00 PM Monday through Friday,  call [phone].  After hours and on weekends, please call the GI answering service at [phone] who will take a message and have the physician on call contact you.   Following lower endoscopy (colonoscopy or flexible sigmoidoscopy):  Excessive amounts of blood in the stool  Significant tenderness or worsening of abdominal pains  Swelling of the abdomen that is new, acute  Fever of 100F or higher    FOLLOW UP: If any biopsies were taken you will be contacted by phone or by letter within the next 1-3 weeks.  Call your gastroenterologist if you have not heard about the biopsies in 3 weeks.  Our staff will call the home number listed on your records the next business day following your procedure to check on you and address any questions or concerns that you may have at that time regarding the information given to you following your procedure. This is a courtesy call and so if there is no answer at the home number and we have not heard from you through the emergency physician on call, we will assume that you have returned to your regular daily activities without incident.  SIGNATURES/CONFIDENTIALITY: You and/or your care partner have signed paperwork which will be entered into your electronic medical record.  These signatures attest to the fact that that the information above on your After Visit Summary has been reviewed and is understood.  Full responsibility of the confidentiality   of this discharge information lies with you and/or your care-partner.  Polyp, diverticulosis, and high fiber information given.  EGD scheduled for 04/04/14 at 3PM, to arrive at [facility] that day.  Pre evaluation will be 10/13, Tuesday at [facility].  Appointment for 11/20 with Dr. Henrene Pastor cancelled.  Next colonoscopy 3 years-2018.

## 2014-03-24 ENCOUNTER — Telehealth: Payer: Self-pay | Admitting: *Deleted

## 2014-03-24 NOTE — Telephone Encounter (Signed)
  Follow up Call-  Call back number 03/21/2014  Post procedure Call Back phone  # [phone]  Permission to leave phone message Yes     Patient questions:  Do you have a fever, pain , or abdominal swelling? No. Pain Score  0 *  Have you tolerated food without any problems? Yes.    Have you been able to return to your normal activities? Yes.    Do you have any questions about your discharge instructions: Diet   No. Medications  No. Follow up visit  No.  Do you have questions or concerns about your Care? No.  Actions: * If pain score is 4 or above: No action needed, pain <4.

## 2014-03-26 ENCOUNTER — Encounter: Payer: Self-pay | Admitting: Internal Medicine

## 2014-03-27 ENCOUNTER — Ambulatory Visit (INDEPENDENT_AMBULATORY_CARE_PROVIDER_SITE_OTHER): Payer: 59 | Admitting: Family Medicine

## 2014-03-27 ENCOUNTER — Encounter: Payer: Self-pay | Admitting: Family Medicine

## 2014-03-27 VITALS — BP 128/80 | HR 78 | Temp 98.2°F | Wt 243.5 lb

## 2014-03-27 DIAGNOSIS — R05 Cough: Secondary | ICD-10-CM

## 2014-03-27 DIAGNOSIS — R55 Syncope and collapse: Secondary | ICD-10-CM

## 2014-03-27 DIAGNOSIS — E1165 Type 2 diabetes mellitus with hyperglycemia: Secondary | ICD-10-CM

## 2014-03-27 DIAGNOSIS — E119 Type 2 diabetes mellitus without complications: Secondary | ICD-10-CM

## 2014-03-27 DIAGNOSIS — IMO0002 Reserved for concepts with insufficient information to code with codable children: Secondary | ICD-10-CM

## 2014-03-27 MED ORDER — PRAVASTATIN SODIUM 80 MG PO TABS: ORAL_TABLET | ORAL | Status: DC

## 2014-03-27 MED ORDER — METFORMIN HCL 500 MG PO TABS: ORAL_TABLET | ORAL | Status: DC

## 2014-03-27 MED ORDER — SAXAGLIPTIN HCL 5 MG PO TABS: 5.0000 mg | ORAL_TABLET | Freq: Every day | ORAL | Status: DC

## 2014-03-27 NOTE — Patient Instructions (Signed)
Take care.  Glad to see you.  Recheck A1c and MALB in about 3 months.  Don't change your meds for now.   Work on your [employer] in the meantime.

## 2014-03-27 NOTE — Progress Notes (Signed)
Pre visit review using our clinic review tool, if applicable. No additional management support is needed unless otherwise documented below in the visit note.  Diabetes:  Using medications without difficulties:yes Hypoglycemic episodes:no Hyperglycemic episodes:yes Feet problems:no Blood Sugars averaging:  eye exam within last year: yes A1c up, d/w pt.  "I'm surprised it wasn't higher."  He quit smoking and that affected his weight.  He wants to work on it w/o extra meds.   He had been on track to get A1c lower until this last check.    Patient to get flu shot at work on 04/04/14.   Cough syncope with neg w/u.  No other sx in the meantime.  Notes reviewed re: care [facility] (Chest x-ray shows no pneumonia. Labs are stable with a glucose of 180. CT of the brain is negative. Patient given 2 DuoNeb and with good response less wheezing but still has some wheezing. Will treat with Zithromax and prednisone.)  Meds, vitals, and allergies reviewed.   ROS: See HPI.  Otherwise negative.    GEN: nad, alert and oriented HEENT: mucous membranes moist NECK: supple w/o LA CV: rrr. PULM: ctab, no inc wob ABD: soft, +bs EXT: no edema SKIN: no acute rash  Diabetic foot exam: Normal inspection No skin breakdown No calluses  Normal DP pulses Normal sensation to light touch and monofilament Nails normal

## 2014-03-28 DIAGNOSIS — R55 Syncope and collapse: Secondary | ICD-10-CM | POA: Insufficient documentation

## 2014-03-28 DIAGNOSIS — R05 Cough: Secondary | ICD-10-CM | POA: Insufficient documentation

## 2014-03-28 NOTE — Assessment & Plan Note (Signed)
A1c up, d/w pt. "I'm surprised it wasn't higher."  He quit smoking and that affected his weight. He wants to work on it w/o extra meds.  He had been on track to get A1c lower until this last check.  Patient to get flu shot at work on 04/04/14.  Recheck in 3 months. All d/w pt.

## 2014-03-28 NOTE — Assessment & Plan Note (Signed)
Cough syncope with neg w/u.  No other sx in the meantime.  Notes reviewed re: care everwhere (Chest x-ray shows no pneumonia. Labs are stable with a glucose of 180. CT of the brain is negative. Patient given 2 DuoNeb and with good response less wheezing but still has some wheezing. Will treat with Zithromax and prednisone.)  No further w/u now.

## 2014-04-01 ENCOUNTER — Ambulatory Visit (AMBULATORY_SURGERY_CENTER): Payer: Self-pay | Admitting: *Deleted

## 2014-04-01 ENCOUNTER — Encounter: Payer: Self-pay | Admitting: Internal Medicine

## 2014-04-01 ENCOUNTER — Ambulatory Visit (INDEPENDENT_AMBULATORY_CARE_PROVIDER_SITE_OTHER): Payer: 59 | Admitting: Internal Medicine

## 2014-04-01 VITALS — BP 112/68 | HR 89 | Ht 72.0 in | Wt 245.4 lb

## 2014-04-01 VITALS — Ht 71.0 in | Wt 242.8 lb

## 2014-04-01 DIAGNOSIS — G4733 Obstructive sleep apnea (adult) (pediatric): Secondary | ICD-10-CM

## 2014-04-01 DIAGNOSIS — R1310 Dysphagia, unspecified: Secondary | ICD-10-CM

## 2014-04-01 NOTE — Patient Instructions (Signed)
We are getting a download through Advanced but will leave the CPAP pressure at 13 unless the download suggests a change.  Please call as needed

## 2014-04-01 NOTE — Progress Notes (Signed)
HPI- 03/22/11- 53 year old male former smoker followed for obstructive sleep apnea/CPAP Last here 04/02/2010. We had increased CPAP to 13 CWP and he would like further increase because he still feels a little tired. Back pain actually disturbs his sleep and I think may be a bigger problem. Bedtime around 10 or 11 PM up at 7 AM. Takes occasional temazepam or zaleplon. Occasional soft drink for caffeine in the morning but little coffee.  03/27/12- 53 year old male former smoker followed for obstructive sleep apnea/CPAP Wears CPAP 14/Advanced every night for about 6-7 hours; pressure works well for patient. Get flu vaccine at work. Occasional [place] or [place]  04/01/13- 53 year old male former smoker followed for obstructive sleep apnea/CPAP FOLLOWS FOR: wears CPAP 13/ Advanced every night for about 6-7 hours; DME is [facility] Asks replacement mask. We discussed his upcoming C-spine surgery, explaining that placement of bone or metal may press on the airway and affect his sleep apnea.  04/01/14- 53 year old male former smoker followed for obstructive sleep apnea/CPAP FOLLOWS FOR: Wears CPAP 13/ Advanced every night for about 7 hours;   ROS- see HPI Constitutional:   No-   weight loss, night sweats, fevers, chills,  fatigue, lassitude. HEENT:   No-  headaches, difficulty swallowing, tooth/dental problems, sore throat,       No-  sneezing, itching, ear ache, nasal congestion, post nasal drip,  CV:  No-   chest pain, orthopnea, PND, swelling in lower extremities, anasarca, dizziness, palpitations Resp: No-   shortness of breath with exertion or at rest.              No-   productive cough,  No non-productive cough,  No-  coughing up of blood.              No-   change in color of mucus.  No- wheezing.   Skin: No-   rash or lesions. GI:  No-   heartburn, indigestion, abdominal pain, nausea, vomiting,  GU:  MS:  No-   joint pain or swelling.  . Neuro- nothing unusual  Psych:  No- change in mood or  affect. No depression or anxiety.  No memory loss.  OBJ- General- Alert, Oriented, Affect-appropriate, calm, Distress- none acute, overweight Skin- rash-none, lesions- none, excoriation- none Lymphadenopathy- none Head- atraumatic            Eyes- Gross vision intact, PERRLA, conjunctivae clear secretions            Ears- Hearing, canals-normal            Nose- Clear, no-Septal dev, mucus, polyps, erosion, perforation             Throat- Mallampati III , mucosa clear , drainage- none, tonsils- atrophic Neck- flexible , trachea midline, no stridor , thyroid nl, carotid no bruit Chest - symmetrical excursion , unlabored           Heart/CV- RRR , no murmur , no gallop  , no rub, nl s1 s2                           - JVD- none , edema- none, stasis changes- none, varices- none           Lung- clear to P&A, wheeze- none, cough- none , dullness-none, rub- none           Chest wall-  Abd-  Br/ Gen/ Rectal- Not done, not indicated Extrem- cyanosis- none, clubbing, none, atrophy- none, strength- nl Neuro-  grossly intact to observation

## 2014-04-01 NOTE — Progress Notes (Signed)
Pt uses c pap but no home 02 use. ewm No egg or soy allergy. ewm No problems with past sedation. ewm No diet pills. ewm

## 2014-04-04 ENCOUNTER — Encounter: Payer: Self-pay | Admitting: Internal Medicine

## 2014-04-04 ENCOUNTER — Ambulatory Visit (AMBULATORY_SURGERY_CENTER): Payer: 59 | Admitting: Internal Medicine

## 2014-04-04 VITALS — BP 128/66 | HR 74 | Temp 97.8°F | Resp 15 | Ht 71.0 in | Wt 242.0 lb

## 2014-04-04 DIAGNOSIS — K222 Esophageal obstruction: Secondary | ICD-10-CM

## 2014-04-04 DIAGNOSIS — K2100 Gastro-esophageal reflux disease with esophagitis, without bleeding: Secondary | ICD-10-CM

## 2014-04-04 DIAGNOSIS — R4702 Dysphasia: Secondary | ICD-10-CM

## 2014-04-04 DIAGNOSIS — R1310 Dysphagia, unspecified: Secondary | ICD-10-CM

## 2014-04-04 MED ORDER — SODIUM CHLORIDE 0.9 % IV SOLN: 500.0000 mL | INTRAVENOUS | Status: DC

## 2014-04-04 MED ORDER — OMEPRAZOLE 40 MG PO CPDR: 40.0000 mg | DELAYED_RELEASE_CAPSULE | Freq: Every day | ORAL | Status: DC

## 2014-04-04 NOTE — Progress Notes (Signed)
Called to room to assist during endoscopic procedure.  Patient ID and intended procedure confirmed with present staff. Received instructions for my participation in the procedure from the performing physician.  

## 2014-04-04 NOTE — Op Note (Signed)
[place]  [address]. [place], [address]   ENDOSCOPY PROCEDURE REPORT  PATIENT: Edward Wright, Edward Wright  MR#: [id number] BIRTHDATE: 02/14/1961 , 73  yrs. old GENDER: male ENDOSCOPIST: Eustace Quail, MD REFERRED BY:  .Direct Self (evaluated in the [place] at time of colonoscopy recently) PROCEDURE DATE:  04/04/2014 PROCEDURE:  EGD w/ biopsy and Maloney dilation of esophagus   -52 French ASA CLASS:     Class II INDICATIONS:  dysphagia. MEDICATIONS: Monitored anesthesia care and Propofol 370 mg IV TOPICAL ANESTHETIC: Cetacaine Spray  DESCRIPTION OF PROCEDURE: After the risks benefits and alternatives of the procedure were thoroughly explained, informed consent was obtained.  The LB [id number] endoscope was introduced through the mouth and advanced to the second portion of the duodenum , Without limitations.  The instrument was slowly withdrawn as the mucosa was fully examined.  EXAM:The esophagus revealed 2 circumferential patches of gastric-type mucosa at approximately 20 cm from the incisors. Biopsies taken.  In this region, there was evidence for esophagitis and 15 mm circumferential stricture.  The mucosa below this area was normal esophageal mucosa.  There was mild edema at the gastroesophageal junction.  The stomach and duodenum were normal. Retroflexed views revealed no abnormalities.     The scope was then withdrawn from the patient and the procedure completed. THERAPY: 53 French Maloney dilator was passed into the esophagus without resistance. Heme present (from biopsies). Tolerated well  COMPLICATIONS: There were no immediate complications.  ENDOSCOPIC IMPRESSION: 1. 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 Mild esophagitis at the gastroesophageal junction. Otherwise normal EGD  RECOMMENDATIONS: 1.  Clear liquids until 5 PM, then soft foods rest of day.   Resume prior diet tomorrow. 2.  Prescribe omeprazole 40 mg daily; #30; 11 refills 3.  Call for office visit to be seen by Dr. Henrene Pastor in 6-8  weeks  REPEAT EXAM:  eSigned:  Eustace Quail, MD 04/04/2014 2:58 PM    PJ:[id number] Damita Dunnings, MD  ;. The patient

## 2014-04-04 NOTE — Progress Notes (Signed)
Report to PACU, RN, vss, BBS= Clear.  

## 2014-04-04 NOTE — Patient Instructions (Signed)
YOU HAD AN ENDOSCOPIC PROCEDURE TODAY AT [facility]: Refer to the procedure report that was given to you for any specific questions about what was found during the examination.  If the procedure report does not answer your questions, please call your gastroenterologist to clarify.  If you requested that your care partner not be given the details of your procedure findings, then the procedure report has been included in a sealed envelope for you to review at your convenience later.  YOU SHOULD EXPECT: Some feelings of bloating in the abdomen. Passage of more gas than usual.  Walking can help get rid of the air that was put into your GI tract during the procedure and reduce the bloating. If you had a lower endoscopy (such as a colonoscopy or flexible sigmoidoscopy) you may notice spotting of blood in your stool or on the toilet paper. If you underwent a bowel prep for your procedure, then you may not have a normal bowel movement for a few days.  DIET: Only clear liquids until 5pm, then you may have a soft diet for the rest of today.  Regular diet for tomorrow. Likewise meals heavy in dairy and vegetables can cause extra gas to form and this can also increase the bloating.  Drink plenty of fluids but you should avoid alcoholic beverages for 24 hours.  ACTIVITY: Your care partner should take you home directly after the procedure.  You should plan to take it easy, moving slowly for the rest of the day.  You can resume normal activity the day after the procedure however you should NOT DRIVE or use heavy machinery for 24 hours (because of the sedation medicines used during the test).    SYMPTOMS TO REPORT IMMEDIATELY: A gastroenterologist can be reached at any hour.  During normal business hours, 8:30 AM to 5:00 PM Monday through Friday, call [phone].  After hours and on weekends, please call the GI answering service at [phone] who will take a message and have the physician on call  contact you.   Following upper endoscopy (EGD)  Vomiting of blood or coffee ground material  New chest pain or pain under the shoulder blades  Painful or persistently difficult swallowing  New shortness of breath  Fever of 100F or higher  Black, tarry-looking stools  FOLLOW UP: If any biopsies were taken you will be contacted by phone or by letter within the next 1-3 weeks.  Call your gastroenterologist if you have not heard about the biopsies in 3 weeks.  Our staff will call the home number listed on your records the next business day following your procedure to check on you and address any questions or concerns that you may have at that time regarding the information given to you following your procedure. This is a courtesy call and so if there is no answer at the home number and we have not heard from you through the emergency physician on call, we will assume that you have returned to your regular daily activities without incident.  SIGNATURES/CONFIDENTIALITY: You and/or your care partner have signed paperwork which will be entered into your electronic medical record.  These signatures attest to the fact that that the information above on your After Visit Summary has been reviewed and is understood.  Full responsibility of the confidentiality of this discharge information lies with you and/or your care-partner.  Take your omeprazole 1/2 hour before breakfast every day. Read the handouts given to you by your recovery room  nurse.

## 2014-04-07 ENCOUNTER — Telehealth: Payer: Self-pay | Admitting: *Deleted

## 2014-04-07 NOTE — Telephone Encounter (Signed)
  Follow up Call-  Call back number 04/04/2014 03/21/2014  Post procedure Call Back phone  # [phone]  Permission to leave phone message Yes Yes     Patient questions:  Do you have a fever, pain , or abdominal swelling? No. Pain Score  0 *  Have you tolerated food without any problems? Yes.    Have you been able to return to your normal activities? Yes.    Do you have any questions about your discharge instructions: Diet   No. Medications  No. Follow up visit  No.  Do you have questions or concerns about your Care? No.  Actions: * If pain score is 4 or above: No action needed, pain <4.

## 2014-04-08 ENCOUNTER — Encounter: Payer: Self-pay | Admitting: Internal Medicine

## 2014-05-09 ENCOUNTER — Ambulatory Visit: Payer: 59 | Admitting: Internal Medicine

## 2014-06-04 ENCOUNTER — Encounter: Payer: Self-pay | Admitting: Internal Medicine

## 2014-06-04 ENCOUNTER — Ambulatory Visit (INDEPENDENT_AMBULATORY_CARE_PROVIDER_SITE_OTHER): Payer: 59 | Admitting: Internal Medicine

## 2014-06-04 VITALS — BP 120/74 | HR 72 | Ht 71.0 in | Wt 246.0 lb

## 2014-06-04 DIAGNOSIS — K222 Esophageal obstruction: Secondary | ICD-10-CM

## 2014-06-04 DIAGNOSIS — R1310 Dysphagia, unspecified: Secondary | ICD-10-CM

## 2014-06-04 DIAGNOSIS — Z86010 Personal history of colonic polyps: Secondary | ICD-10-CM

## 2014-06-04 NOTE — Progress Notes (Signed)
HISTORY OF PRESENT ILLNESS:  Edward Wright is a 52 y.o. male with a history of adenomatous colon polyps and GERD. He underwent screening colonoscopy 03/21/2014 and was found to have 8 adenomatous polyps. Follow-up in 3 years recommended. Because of complaints of intermittent solid food dysphagia he underwent upper endoscopy 04/04/2014. He was found to have circumferential patches of gastric type mucosa at about 20 cm from the incisors. Biopsies revealed gastric type mucosa. In this region was a 15 mm circumferential benign-appearing stricture. This was dilated to 49 [place]. He was prescribed omeprazole 40 mg daily. The patient presents today for follow-up. He reports no complaints of GERD. He did have some transient improvement in dysphagia but has had recurrent problems since, though may be not quite as severe. No other issues or complaints.  REVIEW OF SYSTEMS:  All non-GI ROS negative upon review  Past Medical History  Diagnosis Date  . Diabetes mellitus type II 01/2003  . GERD (gastroesophageal reflux disease) 11/2002  . Hyperlipemia 04/1989  . BPH (benign prostatic hyperplasia) 07/1997  . Sleep apnea     sleep study pos on CPAP  . Heart murmur     history of  . Spermatocele   . Bell's palsy     L sided with mild persistent lip droop  . Colon polyps     adenomatous  . Peptic stricture of esophagus   . Internal hemorrhoids   . Diverticulosis     Past Surgical History  Procedure Laterality Date  . Hematuria  12/1994    w/u neg (urol)  . Cystectomy Right 1990    ganglion wrist  . Doppler echocardiography  01/13/2004    normal  . Cholecystectomy  01/05/2004  . Vasectomy  05/26/2006  . Colonoscopy      Social History Edward Wright  reports that he quit smoking about 4 months ago. His smoking use included Cigarettes. He has a 10 pack-year smoking history. He has never used smokeless tobacco. He reports that he drinks alcohol. He reports that he does not use illicit  drugs.  family history includes Cancer in his father and other; Dementia in his mother; Heart disease in his mother; Hypertension in his mother; Transient ischemic attack in his mother. There is no history of Colon cancer, Prostate cancer, Esophageal cancer, Rectal cancer, or Stomach cancer.  Allergies  Allergen Reactions  . Hydrocodone Nausea Only    Sweating       PHYSICAL EXAMINATION: Vital signs: BP 120/74 mmHg  Pulse 72  Ht 5\' 11"  (1.803 m)  Wt 246 lb (111.585 kg)  BMI 34.33 kg/m2 General: Well-developed, well-nourished, no acute distress Abdomen:Not reexamined  Psychiatric: alert and oriented x3. Cooperative     ASSESSMENT:  #1. 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 Status post esophageal dilation to 52 [place] 04/04/2014. Now with recurrent symptoms #2. Multiple adenomatous colon polyps.   PLAN:  #1. Continue PPI #2. Reschedule upper endoscopy to reevaluate the esophageal mucosa as well as redilate the esophagus for recurrent dysphagia. The patient wishes to postpone this until after the new year for financial reasons. In the interim, he has been instructed to chew his food very carefully. #3. Surveillance colonoscopy around October 2018

## 2014-06-04 NOTE — Patient Instructions (Signed)
Call us when you are ready to schedule your endoscopy.  Be careful when eating and chew your food well.  Continue on your Omeprazole

## 2014-06-23 ENCOUNTER — Other Ambulatory Visit (INDEPENDENT_AMBULATORY_CARE_PROVIDER_SITE_OTHER): Payer: 59

## 2014-06-23 DIAGNOSIS — E119 Type 2 diabetes mellitus without complications: Secondary | ICD-10-CM

## 2014-06-23 LAB — HEMOGLOBIN A1C: HEMOGLOBIN A1C: 9.7 % — AB (ref 4.6–6.5)

## 2014-06-23 LAB — MICROALBUMIN / CREATININE URINE RATIO
CREATININE, U: 417.5 mg/dL
Microalb Creat Ratio: 1.1 mg/g (ref 0.0–30.0)
Microalb, Ur: 4.6 mg/dL — ABNORMAL HIGH (ref 0.0–1.9)

## 2014-06-27 ENCOUNTER — Encounter: Payer: Self-pay | Admitting: Family Medicine

## 2014-06-27 ENCOUNTER — Ambulatory Visit (INDEPENDENT_AMBULATORY_CARE_PROVIDER_SITE_OTHER): Payer: 59 | Admitting: Family Medicine

## 2014-06-27 VITALS — BP 122/70 | HR 92 | Temp 98.0°F | Wt 251.0 lb

## 2014-06-27 DIAGNOSIS — IMO0002 Reserved for concepts with insufficient information to code with codable children: Secondary | ICD-10-CM

## 2014-06-27 DIAGNOSIS — E1165 Type 2 diabetes mellitus with hyperglycemia: Secondary | ICD-10-CM

## 2014-06-27 MED ORDER — INSULIN GLARGINE 100 UNIT/ML SOLOSTAR PEN: PEN_INJECTOR | SUBCUTANEOUS | Status: DC

## 2014-06-27 MED ORDER — INSULIN PEN NEEDLE 31G X 5 MM MISC: Status: DC

## 2014-06-27 NOTE — Patient Instructions (Signed)
Get the pens and needles and then schedule a visit with me to give your first insulin shot.  Ask for a 32min visit.  Take care. I'll see you soon.

## 2014-06-27 NOTE — Progress Notes (Signed)
Pre visit review using our clinic review tool, if applicable. No additional management support is needed unless otherwise documented below in the visit note.  Diabetes:  Using medications without difficulties:yes Hypoglycemic episodes:no Hyperglycemic episodes:yes Feet problems:  Blood Sugars averaging: not checked often.  eye exam within last year:due to go back.   A1c up.  D/w pt.   PMH and SH reviewed  Meds, vitals, and allergies reviewed.   ROS: See HPI.  Otherwise negative.    GEN: nad, alert and oriented Remainder of exam spent in counseling.

## 2014-06-29 NOTE — Assessment & Plan Note (Signed)
D/w pt about A1c, insulin start.  Used demo of insulin pen.   rx sent, he'll get rx filled for needles and pens and then return for full insulin teaching with me.  >15 minutes spent in face to face time with patient, >50% spent in counselling or coordination of care.

## 2014-06-30 ENCOUNTER — Telehealth: Payer: Self-pay

## 2014-06-30 NOTE — Telephone Encounter (Signed)
Edward Wright advised.   Edward Wright states he misunderstood Dr. Damita Dunnings and didn't realize he was supposed to schedule the OV to start insulin administration.  Directions were reiterated to the Edward Wright and he seems to understand and will schedule OV if he needs further instruction.

## 2014-06-30 NOTE — Telephone Encounter (Signed)
Pt was seen on 06/27/2014; pt was to get pens and needles and schedule 30 min appt with Dr Damita Dunnings for insulin administration instruction. Pt went by directions with lantus and on 06/29/14 and 06/30/14 self administered 6 units of lantus on both days. 06/29/14 FBS was 220 or 230 and on 06/30/14 at 7:30 AM FBS was 210 or 220 ; pt took 6 units of lantus and at 9:30 am BS was 300 and something?. Pt had eaten 2 ham biscuits with coffee. Pt added had sugar free creamer and the biscuits were small. Pt request cb about further instructions for giving insulin; pt did not schedule appt as requested.

## 2014-06-30 NOTE — Telephone Encounter (Signed)
Please call him.   His sugars would be high to begin with, since his DM2 is not controlled and it's a small dose of insulin.  The fasting AM sugar is the main issue here.   If he's eaten, his sugar will still be up.   The prev insulin titration instructions still stand:  If AM sugar >130, then add 1 until. If AM sugar <80 then dec 1 unit. If 80-130, no change.  If he gave himself 6 units today and his Am sugar was still >130, then he should give himself 7 tomorrow, etc.  I'm glad to see him at OV to go over all of this.  Thanks.

## 2014-07-04 ENCOUNTER — Encounter: Payer: Self-pay | Admitting: Family Medicine

## 2014-07-04 ENCOUNTER — Ambulatory Visit (INDEPENDENT_AMBULATORY_CARE_PROVIDER_SITE_OTHER): Payer: 59 | Admitting: Family Medicine

## 2014-07-04 VITALS — BP 118/62 | HR 91 | Temp 98.4°F | Wt 248.0 lb

## 2014-07-04 DIAGNOSIS — E1165 Type 2 diabetes mellitus with hyperglycemia: Secondary | ICD-10-CM

## 2014-07-04 DIAGNOSIS — IMO0002 Reserved for concepts with insufficient information to code with codable children: Secondary | ICD-10-CM

## 2014-07-04 MED ORDER — GLUCOSE BLOOD VI STRP: ORAL_STRIP | Status: DC

## 2014-07-04 MED ORDER — INSULIN GLARGINE 100 UNIT/ML SOLOSTAR PEN: PEN_INJECTOR | SUBCUTANEOUS | Status: DC

## 2014-07-04 NOTE — Progress Notes (Signed)
Pre visit review using our clinic review tool, if applicable. No additional management support is needed unless otherwise documented below in the visit note.  F/u for insulin teaching.  Gave 10 units this AM at OV.  Sugar 188 this AM.  No low sugars.  Prev with AM sugars in the 200s.   Meds, vitals, and allergies reviewed.   ROS: See HPI.  Otherwise, noncontributory.  nad Exam deferred.  He gave himself 10 units lantus via pen with sterile technique in L lower abd, no complications, tolerated well.

## 2014-07-04 NOTE — Patient Instructions (Signed)
Keep going up on your insulin dose to get your sugar under 130.  If not better by the time you get to 20 units, then notify me.  We may need to change your needle size.

## 2014-07-04 NOTE — Assessment & Plan Note (Signed)
See above, technique observed, good technique.  Instruction manual given to patient.  I think his sugar will trend down.  If not by 20 units, then he may need a longer needle.  D/w pt.  He'll update me and we'll go from there.  D/w pt about dose titration based on AM sugar level.  He is given shot in AMs and that should be okay for now, but we may have to change to PM injection if not well tolerated or controlled in the future.  >15 minutes spent in face to face time with patient, >50% spent in counselling or coordination of care.

## 2014-07-14 ENCOUNTER — Encounter: Payer: Self-pay | Admitting: Family Medicine

## 2014-07-15 ENCOUNTER — Other Ambulatory Visit: Payer: Self-pay | Admitting: Family Medicine

## 2014-07-15 MED ORDER — INSULIN GLARGINE 100 UNIT/ML SOLOSTAR PEN: PEN_INJECTOR | SUBCUTANEOUS | Status: DC

## 2014-07-23 ENCOUNTER — Encounter: Payer: Self-pay | Admitting: Family Medicine

## 2014-07-24 MED ORDER — GLUCOSE BLOOD VI STRP: ORAL_STRIP | Status: DC

## 2014-07-28 ENCOUNTER — Telehealth: Payer: Self-pay | Admitting: Family Medicine

## 2014-07-28 NOTE — Telephone Encounter (Signed)
Mailed

## 2014-07-28 NOTE — Telephone Encounter (Signed)
rx for meter written.  Please mail to patient. Thanks.

## 2014-07-28 NOTE — Telephone Encounter (Signed)
Rx mailed to patient.

## 2014-07-31 NOTE — Telephone Encounter (Signed)
It's in the mail and the mail is slow.  You should get it today or certainly by tomorrow.  Dr. Amanda Cockayne it so it won't be easy to send it electronically to [facility].

## 2014-08-18 ENCOUNTER — Other Ambulatory Visit: Payer: Self-pay | Admitting: *Deleted

## 2014-08-18 ENCOUNTER — Encounter: Payer: Self-pay | Admitting: Family Medicine

## 2014-08-18 MED ORDER — ONETOUCH VERIO FLEX SYSTEM W/DEVICE KIT: 1.0000 | PACK | Freq: Two times a day (BID) | Status: DC | PRN

## 2014-08-19 ENCOUNTER — Encounter: Payer: Self-pay | Admitting: *Deleted

## 2014-09-15 ENCOUNTER — Other Ambulatory Visit: Payer: Self-pay | Admitting: Family Medicine

## 2014-09-15 ENCOUNTER — Encounter: Payer: Self-pay | Admitting: Family Medicine

## 2014-09-15 MED ORDER — HYDROCORTISONE ACETATE 25 MG RE SUPP: 25.0000 mg | Freq: Three times a day (TID) | RECTAL | Status: DC | PRN

## 2014-11-10 ENCOUNTER — Encounter: Payer: Self-pay | Admitting: Family Medicine

## 2014-12-07 ENCOUNTER — Other Ambulatory Visit: Payer: Self-pay | Admitting: Family Medicine

## 2014-12-07 DIAGNOSIS — IMO0002 Reserved for concepts with insufficient information to code with codable children: Secondary | ICD-10-CM

## 2014-12-07 DIAGNOSIS — E1165 Type 2 diabetes mellitus with hyperglycemia: Secondary | ICD-10-CM

## 2014-12-16 ENCOUNTER — Other Ambulatory Visit (INDEPENDENT_AMBULATORY_CARE_PROVIDER_SITE_OTHER): Payer: 59

## 2014-12-16 DIAGNOSIS — E1165 Type 2 diabetes mellitus with hyperglycemia: Secondary | ICD-10-CM

## 2014-12-16 DIAGNOSIS — IMO0002 Reserved for concepts with insufficient information to code with codable children: Secondary | ICD-10-CM

## 2014-12-16 LAB — LIPID PANEL
CHOLESTEROL: 132 mg/dL (ref 0–200)
HDL: 28.7 mg/dL — ABNORMAL LOW (ref >39.00)
LDL Cholesterol: 88 mg/dL (ref 0–99)
NONHDL: 103.3
Total CHOL/HDL Ratio: 5
Triglycerides: 78 mg/dL (ref 0.0–149.0)
VLDL: 15.6 mg/dL (ref 0.0–40.0)

## 2014-12-16 LAB — COMPREHENSIVE METABOLIC PANEL
ALT: 30 U/L (ref 0–53)
AST: 28 U/L (ref 0–37)
Albumin: 4.4 g/dL (ref 3.5–5.2)
Alkaline Phosphatase: 44 U/L (ref 39–117)
BUN: 18 mg/dL (ref 6–23)
CALCIUM: 9.6 mg/dL (ref 8.4–10.5)
CHLORIDE: 103 meq/L (ref 96–112)
CO2: 30 meq/L (ref 19–32)
Creatinine, Ser: 1.06 mg/dL (ref 0.40–1.50)
GFR: 76.84 mL/min (ref >60.00)
Glucose, Bld: 116 mg/dL — ABNORMAL HIGH (ref 70–99)
POTASSIUM: 4.5 meq/L (ref 3.5–5.1)
SODIUM: 140 meq/L (ref 135–145)
TOTAL PROTEIN: 7.2 g/dL (ref 6.0–8.3)
Total Bilirubin: 0.6 mg/dL (ref 0.2–1.2)

## 2014-12-16 LAB — MICROALBUMIN / CREATININE URINE RATIO
Creatinine,U: 176.2 mg/dL
Microalb Creat Ratio: 0.6 mg/g (ref 0.0–30.0)
Microalb, Ur: 1.1 mg/dL (ref 0.0–1.9)

## 2014-12-16 LAB — HEMOGLOBIN A1C: HEMOGLOBIN A1C: 6.1 % (ref 4.6–6.5)

## 2014-12-23 ENCOUNTER — Encounter: Payer: Self-pay | Admitting: Family Medicine

## 2014-12-23 ENCOUNTER — Ambulatory Visit (INDEPENDENT_AMBULATORY_CARE_PROVIDER_SITE_OTHER): Payer: 59 | Admitting: Family Medicine

## 2014-12-23 VITALS — BP 110/76 | HR 72 | Temp 97.8°F | Ht 71.0 in | Wt 248.8 lb

## 2014-12-23 DIAGNOSIS — Z7189 Other specified counseling: Secondary | ICD-10-CM

## 2014-12-23 DIAGNOSIS — R1310 Dysphagia, unspecified: Secondary | ICD-10-CM

## 2014-12-23 DIAGNOSIS — M792 Neuralgia and neuritis, unspecified: Secondary | ICD-10-CM

## 2014-12-23 DIAGNOSIS — Z0000 Encounter for general adult medical examination without abnormal findings: Secondary | ICD-10-CM | POA: Diagnosis not present

## 2014-12-23 DIAGNOSIS — E119 Type 2 diabetes mellitus without complications: Secondary | ICD-10-CM | POA: Diagnosis not present

## 2014-12-23 DIAGNOSIS — M79644 Pain in right finger(s): Secondary | ICD-10-CM

## 2014-12-23 DIAGNOSIS — L570 Actinic keratosis: Secondary | ICD-10-CM | POA: Diagnosis not present

## 2014-12-23 MED ORDER — INSULIN GLARGINE 100 UNIT/ML SOLOSTAR PEN: PEN_INJECTOR | SUBCUTANEOUS | Status: DC

## 2014-12-23 NOTE — Progress Notes (Signed)
Pre visit review using our clinic review tool, if applicable. No additional management support is needed unless otherwise documented below in the visit note.  CPE- See plan.  Routine anticipatory guidance given to patient.  See health maintenance. Tetanus 2011 Flu done prev PNA 2014 Shingles not due yet Colon cancer screening- colonoscopy 2015 Prostate cancer screening and PSA options (with potential risks and benefits of testing vs not testing) were discussed along with recent recs/guidelines.  He declined testing PSA at this point. Living will d/w pt. Would have his wife designated if patient were incapacitated.   Diet and exercise d/w pt.  Encouraged both.  Walking for exercise, ~2-3 miles at a time.  He's working on diet.    He had esophagus stretched with EGD last year with some improvement.  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  He is still on PPI per GI rec.   Flexeril didn't help much with muscle spasms and he couldn't tolerate it as well as he did with valium used prn.  He doesn't have the L arm radicular sx like prev, so he is clearly improved from prev.   R 2nd finger changes.  Some pain and puffiness at the 2nd PIP.  No trauma.  Not worse recently.  R handed.    Scalp lesion.  Present for about 1 month.  Feels rough.  Not bleeding.  Gets irritated.    Diabetes:  Using medications without difficulties:yes Hypoglycemic episodes: occ sx at night with a sweat noted Hyperglycemic episodes:no Feet problems:no Blood Sugars averaging: usually 110-130 in the AMs.   eye exam within last year: due, d/w pt.   A1c much improved.  D/w pt.    PMH and SH reviewed  Meds, vitals, and allergies reviewed.   ROS: See HPI.  Otherwise negative.    GEN: nad, alert and oriented HEENT: mucous membranes moist NECK: supple w/o LA CV: rrr. PULM: ctab, no inc wob ABD: soft, +bs EXT: no edema SKIN: no acute rash but ~48mm lesion on L anterior scalp, likely small AK.  Slight  puffiness at the R hand 2nd PIP w/o erythema or bruising.     Diabetic foot exam: Normal inspection No skin breakdown No calluses  Normal DP pulses Normal sensation to light touch and monofilament Nails normal

## 2014-12-23 NOTE — Patient Instructions (Addendum)
Call about an eye exam.   I would get a flu shot each fall.   Back down to ~55 units of insulin and see if the sweats get better.  I would recheck A1c in about 3-4 months before a visit.  Take care.  Glad to see you.

## 2014-12-24 DIAGNOSIS — L570 Actinic keratosis: Secondary | ICD-10-CM | POA: Insufficient documentation

## 2014-12-24 DIAGNOSIS — M79646 Pain in unspecified finger(s): Secondary | ICD-10-CM | POA: Insufficient documentation

## 2014-12-24 NOTE — Assessment & Plan Note (Signed)
Improved after EGD and would continue PPI given the prev sx and the ectopic gastric tissue noted on exam prev.  D/w pt.

## 2014-12-24 NOTE — Assessment & Plan Note (Signed)
D/w pt.  Treated x3 with liq N2 after discussion with patient.  No complications.  Should resolve.  Can retreat if needed.

## 2014-12-24 NOTE — Assessment & Plan Note (Signed)
Likely mild OA changes, would follow for now.  ROM isn't affected.  D/w pt.  He agrees.

## 2014-12-24 NOTE — Assessment & Plan Note (Signed)
Much improved, possible lows with occ sweats noted.  Back down to ~55 units of insulin and he'll see if the sweats get better.  I would recheck A1c in about 3-4 months before a visit.  He agrees.  Continue work on diet and exercise.

## 2014-12-24 NOTE — Assessment & Plan Note (Signed)
Flexeril didn't help much with muscle spasms and he couldn't tolerate it as well as he did with valium used prn.  He doesn't have the L arm radicular sx like prev, so he is clearly improved from prev. Continue with valium prn.

## 2014-12-24 NOTE — Assessment & Plan Note (Signed)
Routine anticipatory guidance given to patient.  See health maintenance. Tetanus 2011 Flu done prev PNA 2014 Shingles not due yet Colon cancer screening- colonoscopy 2015 Prostate cancer screening and PSA options (with potential risks and benefits of testing vs not testing) were discussed along with recent recs/guidelines.  He declined testing PSA at this point. Living will d/w pt. Would have his wife designated if patient were incapacitated.   Diet and exercise d/w pt.  Encouraged both.  Walking for exercise, ~2-3 miles at a time.  He's working on diet.

## 2015-01-06 ENCOUNTER — Other Ambulatory Visit: Payer: Self-pay | Admitting: Family Medicine

## 2015-03-04 ENCOUNTER — Other Ambulatory Visit: Payer: Self-pay | Admitting: Internal Medicine

## 2015-03-04 ENCOUNTER — Other Ambulatory Visit: Payer: Self-pay | Admitting: Family Medicine

## 2015-03-09 ENCOUNTER — Other Ambulatory Visit: Payer: Self-pay | Admitting: Family Medicine

## 2015-03-09 DIAGNOSIS — E119 Type 2 diabetes mellitus without complications: Secondary | ICD-10-CM

## 2015-03-19 ENCOUNTER — Other Ambulatory Visit (INDEPENDENT_AMBULATORY_CARE_PROVIDER_SITE_OTHER): Payer: 59

## 2015-03-19 DIAGNOSIS — E119 Type 2 diabetes mellitus without complications: Secondary | ICD-10-CM

## 2015-03-19 LAB — HEMOGLOBIN A1C: HEMOGLOBIN A1C: 6.4 % (ref 4.6–6.5)

## 2015-03-25 ENCOUNTER — Encounter: Payer: Self-pay | Admitting: Family Medicine

## 2015-03-25 ENCOUNTER — Ambulatory Visit (INDEPENDENT_AMBULATORY_CARE_PROVIDER_SITE_OTHER): Payer: 59 | Admitting: Family Medicine

## 2015-03-25 VITALS — BP 104/64 | HR 74 | Temp 98.4°F | Wt 254.2 lb

## 2015-03-25 DIAGNOSIS — E119 Type 2 diabetes mellitus without complications: Secondary | ICD-10-CM

## 2015-03-25 DIAGNOSIS — M545 Low back pain: Secondary | ICD-10-CM

## 2015-03-25 DIAGNOSIS — Z119 Encounter for screening for infectious and parasitic diseases, unspecified: Secondary | ICD-10-CM

## 2015-03-25 MED ORDER — DIAZEPAM 5 MG PO TABS: 5.0000 mg | ORAL_TABLET | Freq: Three times a day (TID) | ORAL | Status: DC | PRN

## 2015-03-25 MED ORDER — TRAMADOL HCL 50 MG PO TABS: 50.0000 mg | ORAL_TABLET | Freq: Three times a day (TID) | ORAL | Status: DC | PRN

## 2015-03-25 NOTE — Patient Instructions (Addendum)
When you get a chance, then get an eye exam done.   Recheck labs in about 4 months, before a visit.  Use tramadol and valium- sedation caution.  Update me as needed.   Gently stretch.  Take care, glad to see you.

## 2015-03-25 NOTE — Progress Notes (Signed)
Pre visit review using our clinic review tool, if applicable. No additional management support is needed unless otherwise documented below in the visit note.  Diabetes:  Using medications without difficulties:yes Hypoglycemic episodes:no Hyperglycemic episodes:no Feet problems:no Blood Sugars averaging: ~130 eye exam within last year: due, d/w pt.  A1c controlled. D/w pt.   Flu shot to be done at work.   He's working on diet.    Wife had a hysterectomy yesterday.  He has been helping her.  His back has been hurting in the meantime, in the last few days.  Pain worse in the meantime.  L lower back pain.  Had gone the chiropractor prev, in distant past.  He has some lower back spasms.  No leg weakness.  No B/B sx.  Some pain radiating into the L buttock area but not fully down the L leg.  Has used heat with some relief, along with a TENS unit.    He has been occ lightheaded.  No syncope.  Can happen with leaning over/forward but not every time.  Episodic, rare overall.   Unclear if events happen when he hasn't had much fluid intake, etc.  He can't identify other triggers associated with the events, since the events are rare.   Pt opts in for HCV and HIV screening with next set of labs.  D/w pt re: routine screening.    Meds, vitals, and allergies reviewed.   ROS: See HPI.  Otherwise negative.    GEN: nad, alert and oriented HEENT: mucous membranes moist NECK: supple w/o LA CV: rrr. PULM: ctab, no inc wob ABD: soft, +bs EXT: no edema SKIN: no acute rash Back w/o midline pain but L lower back with muscle spasm noted S/S wnl BLE

## 2015-03-26 NOTE — Assessment & Plan Note (Signed)
Controlled, continue work on diet and weight.   See above re: occ lightheaded sx.  No bruit on exam and he doesn't have syncope with the episodes.  He'll inc his fluid intake and see if he can identify any other associated triggers.  He agrees to update me as needed.  No sx now.  Okay for outpatient f/u.  He doesn't have exertional/alarming sx.

## 2015-03-26 NOTE — Assessment & Plan Note (Signed)
Likely flare of preexisting issue, either nerve impingement with resultant spasm, or the other way around.  Not emergent.  Can use tramadol and valium prn for spasm, continue stretching and should resolve.  He agrees.  F/u prn. No need to image to day.

## 2015-04-01 ENCOUNTER — Telehealth: Payer: Self-pay | Admitting: Internal Medicine

## 2015-04-01 DIAGNOSIS — G4733 Obstructive sleep apnea (adult) (pediatric): Secondary | ICD-10-CM

## 2015-04-01 NOTE — Telephone Encounter (Signed)
Spoke with pt, requesting order be sent to [facility] for replacement cpap supplies and mask.   Pt had to have appt r/s'ed to Jan 2017 d/t CY's vacation.  Pt aware that CY is not in the office today and this will be addressed when he returns to the office.  CY please advise if you're ok with this order being sent.  Thanks!

## 2015-04-02 ENCOUNTER — Ambulatory Visit: Payer: 59 | Admitting: Internal Medicine

## 2015-04-06 NOTE — Telephone Encounter (Signed)
Pt calling to check on status of orders for c=pap supplies he can be reach @ [phone] please advise.Edward Wright

## 2015-04-06 NOTE — Telephone Encounter (Signed)
Spoke with pt. Advised him that we are still waiting on CY's return to the office. Pt was under the impression that CY was only out of the office for 1 day not a week.  CY - please advise.  Thanks.

## 2015-04-08 NOTE — Telephone Encounter (Signed)
Ok to order his DME company-  Replacement mask of choice and supplies   Dx OSA

## 2015-04-08 NOTE — Telephone Encounter (Signed)
Spoke with pt, aware of order being placed.   Order placed.  Nothing further needed.

## 2015-04-09 ENCOUNTER — Other Ambulatory Visit: Payer: Self-pay | Admitting: Family Medicine

## 2015-05-07 ENCOUNTER — Encounter: Payer: Self-pay | Admitting: *Deleted

## 2015-05-07 ENCOUNTER — Encounter: Payer: Self-pay | Admitting: Family Medicine

## 2015-05-07 MED ORDER — INSULIN DETEMIR 100 UNIT/ML FLEXPEN: PEN_INJECTOR | SUBCUTANEOUS | Status: DC

## 2015-05-07 NOTE — Telephone Encounter (Signed)
Mailed Rx.

## 2015-05-07 NOTE — Telephone Encounter (Signed)
Please send printed levemir hardcopy rx to patient to fillin 2017. Thanks.

## 2015-05-07 NOTE — Telephone Encounter (Signed)
Prescription mailed to patient.

## 2015-05-21 LAB — HM DIABETES EYE EXAM

## 2015-05-24 ENCOUNTER — Other Ambulatory Visit: Payer: Self-pay | Admitting: Family Medicine

## 2015-05-25 NOTE — Telephone Encounter (Signed)
Sent. Thanks.   

## 2015-05-25 NOTE — Telephone Encounter (Signed)
Received refill request electronically from pharmacy Last refill 04/09/15 #12/1 Last office visit 03/25/15 Spoke to pharmacist and was advised that the patient has used the refill and does not have any refills left. Is it okay to refill?

## 2015-05-28 ENCOUNTER — Encounter: Payer: Self-pay | Admitting: Family Medicine

## 2015-05-28 ENCOUNTER — Other Ambulatory Visit: Payer: Self-pay | Admitting: *Deleted

## 2015-05-29 ENCOUNTER — Other Ambulatory Visit: Payer: Self-pay | Admitting: *Deleted

## 2015-05-29 ENCOUNTER — Encounter: Payer: Self-pay | Admitting: Family Medicine

## 2015-05-29 MED ORDER — INSULIN PEN NEEDLE 32G X 6 MM MISC: Status: DC

## 2015-06-19 ENCOUNTER — Other Ambulatory Visit: Payer: Self-pay | Admitting: Family Medicine

## 2015-06-22 ENCOUNTER — Other Ambulatory Visit: Payer: Self-pay | Admitting: Family Medicine

## 2015-06-23 NOTE — Telephone Encounter (Signed)
Electronic refill request. Last Filled:    12 suppository 1 05/25/2015  Please advise.

## 2015-06-24 NOTE — Telephone Encounter (Signed)
Sent!

## 2015-06-25 ENCOUNTER — Ambulatory Visit (INDEPENDENT_AMBULATORY_CARE_PROVIDER_SITE_OTHER): Payer: 59 | Admitting: Internal Medicine

## 2015-06-25 ENCOUNTER — Encounter: Payer: Self-pay | Admitting: Internal Medicine

## 2015-06-25 VITALS — BP 134/68 | HR 75 | Ht 72.0 in | Wt 259.4 lb

## 2015-06-25 DIAGNOSIS — G4733 Obstructive sleep apnea (adult) (pediatric): Secondary | ICD-10-CM | POA: Diagnosis not present

## 2015-06-25 DIAGNOSIS — E119 Type 2 diabetes mellitus without complications: Secondary | ICD-10-CM | POA: Diagnosis not present

## 2015-06-25 NOTE — Progress Notes (Signed)
HPI- 03/22/11- 55 year old male former smoker followed for obstructive sleep apnea/CPAP Last here 04/02/2010. We had increased CPAP to 13 CWP and he would like further increase because he still feels a little tired. Back pain actually disturbs his sleep and I think may be a bigger problem. Bedtime around 10 or 11 PM up at 7 AM. Takes occasional temazepam or zaleplon. Occasional soft drink for caffeine in the morning but little coffee.  03/27/12- 55 year old male former smoker followed for obstructive sleep apnea/CPAP Wears CPAP 14/Advanced every night for about 6-7 hours; pressure works well for patient. Get flu vaccine at work. Occasional [place] or [place]  04/01/13- 55 year old male former smoker followed for obstructive sleep apnea/CPAP FOLLOWS FOR: wears CPAP 13/ Advanced every night for about 6-7 hours; DME is [facility] Asks replacement mask. We discussed his upcoming C-spine surgery, explaining that placement of bone or metal may press on the airway and affect his sleep apnea.  04/01/14- 55 year old male former smoker followed for obstructive sleep apnea/CPAP FOLLOWS FOR: Wears CPAP 13/ Advanced every night for about 7 hours;  CPAP 13/ Advanced  06/25/2015-54 year old male former smoker followed for OSA/CPAP, complicated by DM 2, muscle cramps CPAP 13/ Advanced FOLLOWS FOR:  pt. state he wears CPAP 8hr. everynight. pressure set @ 13 feels pressure is good. no supplies needed at this time. [place] Has diazepam used for muscle spasms/neck pain but occasionally he uses one to help himself sleep. Has not been a constant problem. Compliant with CPAP but admits to me pressure might be a little low at times  ROS- see HPI Constitutional:   No-   weight loss, night sweats, fevers, chills,  fatigue, lassitude. HEENT:   No-  headaches, difficulty swallowing, tooth/dental problems, sore throat,       No-  sneezing, itching, ear ache, nasal congestion, post nasal drip,  CV:  No-   chest pain, orthopnea,  PND, swelling in lower extremities, anasarca, dizziness, palpitations Resp: No-   shortness of breath with exertion or at rest.              No-   productive cough,  No non-productive cough,  No-  coughing up of blood.              No-   change in color of mucus.  No- wheezing.   Skin: No-   rash or lesions. GI:  No-   heartburn, indigestion, abdominal pain, nausea, vomiting,  GU:  MS:  No-   joint pain or swelling.  . Neuro- nothing unusual  Psych:  No- change in mood or affect. No depression or anxiety.  No memory loss.  OBJ- General- Alert, Oriented, Affect-appropriate, calm, Distress- none acute, + overweight Skin- rash-none, lesions- none, excoriation- none Lymphadenopathy- none Head- atraumatic            Eyes- Gross vision intact, PERRLA, conjunctivae clear secretions            Ears- Hearing, canals-normal            Nose- Clear, no-Septal dev, mucus, polyps, erosion, perforation             Throat- Mallampati III , mucosa clear , drainage- none, tonsils- atrophic Neck- flexible , trachea midline, no stridor , thyroid nl, carotid no bruit Chest - symmetrical excursion , unlabored           Heart/CV- RRR , no murmur , no gallop  , no rub, nl s1 s2                           -  JVD- none , edema- none, stasis changes- none, varices- none           Lung- clear to P&A, wheeze- none, cough- none , dullness-none, rub- none           Chest wall-  Abd-  Br/ Gen/ Rectal- Not done, not indicated Extrem- cyanosis- none, clubbing, none, atrophy- none, strength- nl Neuro- grossly intact to observation     

## 2015-06-25 NOTE — Patient Instructions (Signed)
Order- DME Advanced increase CPAP to 14 cwp, add AirView if able, download for pressure compliance   Dx OSA  Please call as needed

## 2015-06-29 NOTE — Assessment & Plan Note (Signed)
We discussed pressure adjustment. He is very compliant and definitely feels better using CPAP. Plan-DME company to increase from 13-14 CWP

## 2015-06-29 NOTE — Assessment & Plan Note (Signed)
Uses occasional diazepam for muscle spasms in neck. Sounds more likely to be related to degenerative cervical spine changes Brother than his diabetes with neuropathy. He is followed for this elsewhere.

## 2015-07-22 ENCOUNTER — Other Ambulatory Visit: Payer: Self-pay | Admitting: Family Medicine

## 2015-07-22 ENCOUNTER — Other Ambulatory Visit (INDEPENDENT_AMBULATORY_CARE_PROVIDER_SITE_OTHER): Payer: 59

## 2015-07-22 DIAGNOSIS — E119 Type 2 diabetes mellitus without complications: Secondary | ICD-10-CM | POA: Diagnosis not present

## 2015-07-22 DIAGNOSIS — Z119 Encounter for screening for infectious and parasitic diseases, unspecified: Secondary | ICD-10-CM

## 2015-07-22 LAB — HEMOGLOBIN A1C: Hgb A1c MFr Bld: 7.1 % — ABNORMAL HIGH (ref 4.6–6.5)

## 2015-07-22 NOTE — Telephone Encounter (Signed)
Received refill request electronically Refill request does not match medication list. Please advise

## 2015-07-22 NOTE — Telephone Encounter (Signed)
Does he needed lantus or levemir?  Which is cheaper for him? How much is he taking daily?   Let me know.  Thanks.

## 2015-07-22 NOTE — Telephone Encounter (Signed)
Spoke to patient and was advised that he did not request the refill, must be an automatic refill request. Patient stated that he is using Levemir, but it is not working good for him. Patient stated that he has an appointment scheduled Monday to discuss this with you. Patient stated that he quit using the Lantus because his insurance would not cover it, but thinks that he can get it cheaper now. Patient stated to hold off on refilling the Lantus until his appointment Monday.

## 2015-07-23 LAB — HIV ANTIBODY (ROUTINE TESTING W REFLEX): HIV: NONREACTIVE

## 2015-07-23 LAB — HEPATITIS C ANTIBODY: HCV AB: NEGATIVE

## 2015-07-23 NOTE — Telephone Encounter (Signed)
Noted. Thanks.

## 2015-07-27 ENCOUNTER — Encounter: Payer: Self-pay | Admitting: Family Medicine

## 2015-07-27 ENCOUNTER — Ambulatory Visit (INDEPENDENT_AMBULATORY_CARE_PROVIDER_SITE_OTHER): Payer: 59 | Admitting: Family Medicine

## 2015-07-27 VITALS — BP 120/70 | HR 90 | Temp 98.4°F | Wt 260.5 lb

## 2015-07-27 DIAGNOSIS — E119 Type 2 diabetes mellitus without complications: Secondary | ICD-10-CM

## 2015-07-27 MED ORDER — INSULIN DETEMIR 100 UNIT/ML FLEXPEN: PEN_INJECTOR | SUBCUTANEOUS | Status: DC

## 2015-07-27 NOTE — Progress Notes (Signed)
Pre visit review using our clinic review tool, if applicable. No additional management support is needed unless otherwise documented below in the visit note.  Daughter is going to graduate from [employer] in 10/2015.    Diabetes:  Using medications without difficulties: yes but with inc in sugars recently, with need to inc dose of insulin up to ~87 units.   Recently changed to levemir for cost.  basaglar may be cheaper.  Hypoglycemic episodes: no Hyperglycemic episodes:  See above Feet problems: no Blood Sugars averaging: 150-200 in AM recently, some improvement with higher dose of insulin.  eye exam within last year:  Done ~05/2015, no retinopathy per patient report.   A1c d/w pt.    Meds, vitals, and allergies reviewed.   ROS: See HPI.  Otherwise negative.    GEN: nad, alert and oriented HEENT: mucous membranes moist NECK: supple w/o LA CV: rrr. PULM: ctab, no inc wob ABD: soft, +bs EXT: no edema

## 2015-07-27 NOTE — Patient Instructions (Addendum)
Change in levemir dose- see below.  Split your dose in half.   Update me next week.  Recheck labs in July before a visit.  Take care.  Glad to see you.

## 2015-07-28 NOTE — Assessment & Plan Note (Signed)
levemir may be relatively shorter acting than the lantus was.  Will split dose to 44 in AM, 44 in PM.  He can adjust daily as needed, will update me next week.   We may need to change to basaglar but this may help in the meantime.   He'll work on diet, exercise.  Recheck in summer 2017.  He agrees.

## 2015-08-03 ENCOUNTER — Encounter: Payer: Self-pay | Admitting: Family Medicine

## 2015-08-07 ENCOUNTER — Other Ambulatory Visit: Payer: Self-pay | Admitting: Family Medicine

## 2015-08-07 ENCOUNTER — Other Ambulatory Visit: Payer: Self-pay | Admitting: Internal Medicine

## 2015-08-07 MED ORDER — INSULIN GLARGINE 100 UNIT/ML SOLOSTAR PEN: 70.0000 [IU] | PEN_INJECTOR | Freq: Every day | SUBCUTANEOUS | Status: DC

## 2015-08-07 NOTE — Telephone Encounter (Signed)
Electronic refill request. Last Filled:   Diazepam   30 tablet 0 03/25/2015  Last Filled:   Anusol 12 suppository 1 06/24/2015  Last Filled:   Tramadol   30 tablet 0 03/25/2015  Last office visit:   07/27/15.   Please advise.

## 2015-08-08 NOTE — Telephone Encounter (Signed)
Please call in tramadol and valium.  Thanks.

## 2015-08-10 NOTE — Telephone Encounter (Signed)
Medication phoned to pharmacy.  

## 2015-08-20 ENCOUNTER — Telehealth: Payer: Self-pay | Admitting: Family Medicine

## 2015-08-20 ENCOUNTER — Ambulatory Visit (INDEPENDENT_AMBULATORY_CARE_PROVIDER_SITE_OTHER): Payer: 59 | Admitting: Internal Medicine

## 2015-08-20 ENCOUNTER — Encounter: Payer: Self-pay | Admitting: Internal Medicine

## 2015-08-20 VITALS — BP 138/74 | HR 91 | Temp 97.6°F | Wt 259.5 lb

## 2015-08-20 DIAGNOSIS — R42 Dizziness and giddiness: Secondary | ICD-10-CM | POA: Diagnosis not present

## 2015-08-20 MED ORDER — MECLIZINE HCL 25 MG PO TABS: 25.0000 mg | ORAL_TABLET | Freq: Three times a day (TID) | ORAL | Status: DC | PRN

## 2015-08-20 NOTE — Telephone Encounter (Signed)
Will see at OV 

## 2015-08-20 NOTE — Progress Notes (Signed)
Subjective:    Patient ID: Edward Wright, male    DOB: [birth year], 55 y.o.   MRN: [id number]  HPI Here due to vertigo problems Has had some symptoms for a while  Head feels "like it is in a cloud" Equilibrium is off Started 2 weeks ago--but comes and goes Sinus type symptoms --like pressure Feels almost migrainous--- better if quiet and no bright light Never diagnosed with migraines but has had severe headaches that would go away with dark/quiet  Not true spinning--- just balance is off "I feel kind of drunk" Hasn't fallen Some nausea  No vision loss--but vision seems better with glasses off Tends to be okay turning head--but has problems looking up and down  Hasn't taken any meds for this  Current Outpatient Prescriptions on File Prior to Visit  Medication Sig Dispense Refill  . B-D UF III MINI PEN NEEDLES 31G X 5 MM MISC USE DAILY WITH INSULIN PEN 100 each 3  . Blood Glucose Monitoring Suppl (ONETOUCH VERIO FLEX SYSTEM) W/DEVICE KIT 1 kit by Other route 2 (two) times daily as needed. Check blood sugar twice daily and as directed.  Insulin Dependent.  Dx:  E11.9 1 kit 0  . clotrimazole (LOTRIMIN) 1 % cream APPLY TOPICALLY 2 (TWO) TIMES DAILY. (Patient taking differently: APPLY TOPICALLY 2 (TWO) TIMES DAILY.AS NEEDED) 30 g 3  . diazepam (VALIUM) 5 MG tablet TAKE 1 TABLET BY MOUTH 3 TIMES A DAY AS NEEDED 30 tablet 1  . hydrocortisone (ANUSOL-HC) 25 MG suppository UNWRAP AND PLACE 1 SUPPOSITORY (25 MG TOTAL) RECTALLY 3 (THREE) TIMES DAILY AS NEEDED. 12 suppository 2  . Insulin Glargine (BASAGLAR KWIKPEN) 100 UNIT/ML Solostar Pen Inject 70 Units into the skin daily at 10 pm. 15 mL prn  . metFORMIN (GLUCOPHAGE) 500 MG tablet TAKE 2 TABLETS (1,000 MG TOTAL) BY MOUTH 2 (TWO) TIMES DAILY WITH A MEAL. 360 tablet 2  . NON FORMULARY CPAP 13 Advanced, use as directed     . omeprazole (PRILOSEC) 40 MG capsule TAKE 1 CAPSULE BY MOUTH DAILY. TAKE 1/2 HOUR BEFORE BREAKFAST EVERY DAY. 90  capsule 1  . ONETOUCH VERIO test strip USE DAILY TO TWICE DAILY TO CHECK BLOOD SUGAR 100 each 11  . ONGLYZA 5 MG TABS tablet TAKE 1 TABLET (5 MG TOTAL) BY MOUTH DAILY. 90 tablet 2  . pravastatin (PRAVACHOL) 80 MG tablet TAKE 1 TABLET (80 MG TOTAL) BY MOUTH AT BEDTIME. 90 tablet 2  . ranitidine (ZANTAC) 150 MG capsule Take 150 mg by mouth 2 (two) times daily as needed.     . traMADol (ULTRAM) 50 MG tablet TAKE 1 TABLET BY MOUTH 3 TIMES A DAY AS NEEDED 30 tablet 1   No current facility-administered medications on file prior to visit.    Allergies  Allergen Reactions  . Hydrocodone Nausea Only    Sweating    Past Medical History  Diagnosis Date  . Diabetes mellitus type II 01/2003  . GERD (gastroesophageal reflux disease) 11/2002  . Hyperlipemia 04/1989  . BPH (benign prostatic hyperplasia) 07/1997  . Sleep apnea     sleep study pos on CPAP  . Heart murmur     history of  . Spermatocele   . Bell's palsy     L sided with mild persistent lip droop  . Colon polyps     adenomatous  . Peptic stricture of esophagus   . Internal hemorrhoids   . Diverticulosis     Past Surgical History  Procedure Laterality  Date  . Hematuria  12/1994    w/u neg (urol)  . Cystectomy Right 1990    ganglion wrist  . Doppler echocardiography  01/13/2004    normal  . Cholecystectomy  01/05/2004  . Vasectomy  05/26/2006  . Colonoscopy      Family History  Problem Relation Age of Onset  . Hypertension Mother   . Heart disease Mother     CAD, MI, stents x 2-3  . Transient ischemic attack Mother   . Dementia Mother   . Cancer Father     multiple myeloma  . Cancer Other     unknown type  . Colon cancer Neg Hx   . Prostate cancer Neg Hx   . Esophageal cancer Neg Hx   . Rectal cancer Neg Hx   . Stomach cancer Neg Hx     Social History   Social History  . Marital Status: Married    Spouse Name: N/A  . Number of Children: 2  . Years of Education: N/A   Occupational History  . A/C  Animal nutritionist, Barrister's clerk   Social History Main Topics  . Smoking status: Former Smoker -- 1.00 packs/day for 10 years    Types: Cigarettes    Quit date: 01/21/2014  . Smokeless tobacco: Never Used  . Alcohol Use: 0.0 oz/week    0 Standard drinks or equivalent per week     Comment: 1 beer a month  . Drug Use: No  . Sexual Activity: Not on file   Other Topics Concern  . Not on file   Social History Narrative   Married 1988 and lives with wife   2 kids   Margaretmary Eddy (local MD) is patient's nephew   Works at [employer] No fever No drainage, sore throat or cough Has been cleaning mom's basement--very dusty  no incontinence Sugars okay--- 140 this AM. Not that happy with the basaglar    Objective:   Physical Exam  Constitutional: He is oriented to person, place, and time. He appears well-developed. No distress.  HENT:  Mouth/Throat: Oropharynx is clear and moist. No oropharyngeal exudate.  Eyes: Conjunctivae and EOM are normal. Pupils are equal, round, and reactive to light.  No nystagmus  Neck: Normal range of motion. Neck supple. No thyromegaly present.  Cardiovascular: Normal rate, regular rhythm and normal heart sounds.  Exam reveals no gallop.   No murmur heard. Pulmonary/Chest: Effort normal and breath sounds normal. No respiratory distress. He has no wheezes. He has no rales.  Musculoskeletal: He exhibits no edema.  Lymphadenopathy:    He has no cervical adenopathy.  Neurological: He is alert and oriented to person, place, and time. He has normal strength. He displays no tremor. No cranial nerve deficit or sensory deficit. He exhibits normal muscle tone. He displays a negative Romberg sign. Coordination and gait normal.          Assessment & Plan:

## 2015-08-20 NOTE — Assessment & Plan Note (Signed)
2 weeks of off and on symptoms including mild headache syndrome that is some what migrainous No signs of intracranial pathology--so hold off on imaging No tinnitus or hearing loss to suggest Meniere's No signs of infection--despite the head pressure  Will try regular meclizine Consider cyproheptadine if that doesn't work (for migraine type syndrome)

## 2015-08-20 NOTE — Telephone Encounter (Signed)
Pt hs appt 08/20/15 at 4:15 with Dr Silvio Pate.

## 2015-08-20 NOTE — Telephone Encounter (Signed)
Patient Name: Edward Wright  DOB: [birth year]    Initial Comment caller states he feels foggy headed, is dizzy and feels like his equilibrium is off   Nurse Assessment  Nurse: Mallie Mussel, RN, Alveta Heimlich Date/Time Eilene Ghazi Time): 08/20/2015 10:55:01 AM  Confirm and document reason for call. If symptomatic, describe symptoms. You must click the next button to save text entered. ---Caller states that he is foggy headed this morning. He is dizzy and feels like his equilibrium is off. He has a room spinning sensation with certain head movements. Denies ear pain. He does have a headache. He rates his headache pain as 3-4 on 0-10 scale. He does have sinus pressure and pain. Denies fever. He states that he has to hold onto something to walk.  Has the patient traveled out of the country within the last 30 days? ---No  Does the patient have any new or worsening symptoms? ---Yes  Will a triage be completed? ---Yes  Related visit to physician within the last 2 weeks? ---No  Does the PT have any chronic conditions? (i.e. diabetes, asthma, etc.) ---Yes  List chronic conditions. ---Diabetes - this morning's reading is 140.  Is this a behavioral health or substance abuse call? ---No     Guidelines    Guideline Title Affirmed Question Affirmed Notes  Dizziness - Lightheadedness SEVERE dizziness (e.g., unable to stand, requires support to walk, feels like passing out now)    Final Disposition User   Go to ED Now (or PCP triage) Mallie Mussel, RN, Alveta Heimlich    Comments  Dr. Damita Dunnings does not have any appointments available. The NP was full. I scheduled him with Dr. Viviana Simpler for today at 4:15pm.   Referrals  REFERRED TO PCP OFFICE   Disagree/Comply: Comply

## 2015-08-20 NOTE — Progress Notes (Signed)
Pre visit review using our clinic review tool, if applicable. No additional management support is needed unless otherwise documented below in the visit note. 

## 2015-08-25 ENCOUNTER — Other Ambulatory Visit: Payer: Self-pay | Admitting: Family Medicine

## 2015-10-31 ENCOUNTER — Other Ambulatory Visit: Payer: Self-pay | Admitting: Family Medicine

## 2015-11-26 ENCOUNTER — Other Ambulatory Visit: Payer: Self-pay | Admitting: Family Medicine

## 2015-11-26 NOTE — Telephone Encounter (Signed)
Electronic refill request.  Last Filled:    12 suppository 2 08/08/2015  Last office visit:   Labs and CPE scheduled mid July 2017   Please advise.

## 2015-11-27 NOTE — Telephone Encounter (Signed)
Sent. Thanks.   

## 2015-12-02 ENCOUNTER — Other Ambulatory Visit: Payer: Self-pay | Admitting: Family Medicine

## 2015-12-27 ENCOUNTER — Other Ambulatory Visit: Payer: Self-pay | Admitting: Family Medicine

## 2015-12-27 DIAGNOSIS — E119 Type 2 diabetes mellitus without complications: Secondary | ICD-10-CM

## 2015-12-28 ENCOUNTER — Other Ambulatory Visit (INDEPENDENT_AMBULATORY_CARE_PROVIDER_SITE_OTHER): Payer: 59

## 2015-12-28 DIAGNOSIS — E119 Type 2 diabetes mellitus without complications: Secondary | ICD-10-CM | POA: Diagnosis not present

## 2015-12-28 LAB — LIPID PANEL
CHOL/HDL RATIO: 5
CHOLESTEROL: 139 mg/dL (ref 0–200)
HDL: 26.7 mg/dL — ABNORMAL LOW (ref >39.00)
LDL CALC: 80 mg/dL (ref 0–99)
NonHDL: 111.93
TRIGLYCERIDES: 158 mg/dL — AB (ref 0.0–149.0)
VLDL: 31.6 mg/dL (ref 0.0–40.0)

## 2015-12-28 LAB — COMPREHENSIVE METABOLIC PANEL
ALBUMIN: 4.2 g/dL (ref 3.5–5.2)
ALT: 33 U/L (ref 0–53)
AST: 28 U/L (ref 0–37)
Alkaline Phosphatase: 43 U/L (ref 39–117)
BUN: 22 mg/dL (ref 6–23)
CHLORIDE: 104 meq/L (ref 96–112)
CO2: 29 meq/L (ref 19–32)
CREATININE: 1.25 mg/dL (ref 0.40–1.50)
Calcium: 9.3 mg/dL (ref 8.4–10.5)
GFR: 63.29 mL/min (ref >60.00)
Glucose, Bld: 104 mg/dL — ABNORMAL HIGH (ref 70–99)
POTASSIUM: 4.4 meq/L (ref 3.5–5.1)
SODIUM: 139 meq/L (ref 135–145)
Total Bilirubin: 0.3 mg/dL (ref 0.2–1.2)
Total Protein: 7 g/dL (ref 6.0–8.3)

## 2015-12-28 LAB — MICROALBUMIN / CREATININE URINE RATIO
Creatinine,U: 245.7 mg/dL
MICROALB/CREAT RATIO: 0.5 mg/g (ref 0.0–30.0)
Microalb, Ur: 1.3 mg/dL (ref 0.0–1.9)

## 2015-12-28 LAB — HEMOGLOBIN A1C: Hgb A1c MFr Bld: 6.9 % — ABNORMAL HIGH (ref 4.6–6.5)

## 2015-12-29 ENCOUNTER — Encounter: Payer: Self-pay | Admitting: Family Medicine

## 2015-12-29 ENCOUNTER — Ambulatory Visit (INDEPENDENT_AMBULATORY_CARE_PROVIDER_SITE_OTHER): Payer: 59 | Admitting: Family Medicine

## 2015-12-29 VITALS — BP 112/60 | HR 95 | Temp 98.9°F | Ht 72.0 in | Wt 260.5 lb

## 2015-12-29 DIAGNOSIS — E785 Hyperlipidemia, unspecified: Secondary | ICD-10-CM

## 2015-12-29 DIAGNOSIS — Z125 Encounter for screening for malignant neoplasm of prostate: Secondary | ICD-10-CM

## 2015-12-29 DIAGNOSIS — E119 Type 2 diabetes mellitus without complications: Secondary | ICD-10-CM

## 2015-12-29 DIAGNOSIS — M545 Low back pain: Secondary | ICD-10-CM

## 2015-12-29 DIAGNOSIS — Z0000 Encounter for general adult medical examination without abnormal findings: Secondary | ICD-10-CM | POA: Diagnosis not present

## 2015-12-29 MED ORDER — DIAZEPAM 5 MG PO TABS: 5.0000 mg | ORAL_TABLET | Freq: Three times a day (TID) | ORAL | Status: DC | PRN

## 2015-12-29 MED ORDER — METFORMIN HCL 500 MG PO TABS: ORAL_TABLET | ORAL | Status: DC

## 2015-12-29 MED ORDER — BASAGLAR KWIKPEN 100 UNIT/ML ~~LOC~~ SOPN: PEN_INJECTOR | SUBCUTANEOUS | Status: DC

## 2015-12-29 MED ORDER — PRAVASTATIN SODIUM 80 MG PO TABS: 80.0000 mg | ORAL_TABLET | Freq: Every day | ORAL | Status: DC

## 2015-12-29 NOTE — Patient Instructions (Signed)
I would get a flu shot each fall.   You can try ranitidine 150mg  twice a day, OTC.   Recheck labs in about 6 months before a visit.  Take care.  Glad to see you.

## 2015-12-29 NOTE — Progress Notes (Signed)
Pre visit review using our clinic review tool, if applicable. No additional management support is needed unless otherwise documented below in the visit note.  CPE- See plan.  Routine anticipatory guidance given to patient.  See health maintenance. Tetanus 2011 Flu done prev PNA 2014 Shingles not due yet Colon cancer screening- colonoscopy 2015 PSA screening to be done with next set of labs.   Living will d/w pt. Would have his wife designated if patient were incapacitated.  Diet and exercise d/w pt. Encouraged both. Walking for exercise, ~2-3 miles at a time. He's working on diet. d/w pt.    Diabetes:  Using medications without difficulties: yes Hypoglycemic episodes:rarely, if higher exertion Hyperglycemic episodes:no Feet problems:no Blood Sugars averaging: 110-140s usually eye exam within last year: yes A1c better.  D/w pt.  He has been splitting his dose.    Elevated Cholesterol: Using medications without problems:yes Muscle aches: no Diet compliance:yes Exercise:yes  Valium prn spasms.  Used rarely, needed refill.   PMH and SH reviewed  Meds, vitals, and allergies reviewed.   ROS: Per HPI.  Unless specifically indicated otherwise in HPI, the patient denies:  General: fever. Eyes: acute vision changes ENT: sore throat Cardiovascular: chest pain Respiratory: SOB GI: vomiting GU: dysuria Musculoskeletal: acute back pain Derm: acute rash Neuro: acute motor dysfunction Psych: worsening mood Endocrine: polydipsia Heme: bleeding Allergy: hayfever  GEN: nad, alert and oriented HEENT: mucous membranes moist NECK: supple w/o LA CV: rrr. PULM: ctab, no inc wob ABD: soft, +bs EXT: no edema SKIN: no acute rash  Diabetic foot exam: Normal inspection No skin breakdown No calluses  Normal DP pulses Normal sensation to light touch and monofilament Nails normal

## 2015-12-30 NOTE — Assessment & Plan Note (Signed)
Continue statin. Still needs work on weight loss and diet and exercise.  D/w pt.  He agrees.

## 2015-12-30 NOTE — Assessment & Plan Note (Signed)
Valium used prn for spasms.  Used rarely, needed refill.  Done at [place].

## 2015-12-30 NOTE — Assessment & Plan Note (Signed)
Tetanus 2011 Flu done prev PNA 2014 Shingles not due yet Colon cancer screening- colonoscopy 2015 PSA screening to be done with next set of labs.  D/w pt about pros and cons.  Living will d/w pt. Would have his wife designated if patient were incapacitated.  Diet and exercise d/w pt. Encouraged both. Walking for exercise, ~2-3 miles at a time. He's working on diet. d/w pt.

## 2015-12-30 NOTE — Assessment & Plan Note (Signed)
Continue split dosing of insulin, continue work on diet and exercise, recheck in about 6 months.  He agrees.

## 2016-01-12 ENCOUNTER — Other Ambulatory Visit: Payer: Self-pay | Admitting: *Deleted

## 2016-01-12 ENCOUNTER — Encounter: Payer: Self-pay | Admitting: Family Medicine

## 2016-01-12 MED ORDER — GLUCOSE BLOOD VI STRP: ORAL_STRIP | 11 refills | Status: DC

## 2016-01-12 NOTE — Telephone Encounter (Signed)
Edward Wright with [facility] request status of onetouch verio test strip refills. Advised Edward Wright already refilled. Edward Wright did find refill and nothing further needed.

## 2016-01-26 ENCOUNTER — Other Ambulatory Visit: Payer: Self-pay | Admitting: Internal Medicine

## 2016-04-11 ENCOUNTER — Other Ambulatory Visit: Payer: Self-pay | Admitting: Family Medicine

## 2016-04-11 NOTE — Telephone Encounter (Signed)
Electronic refill request. Last Filled:    12 suppository 2 11/27/2015  Please advise.

## 2016-04-12 NOTE — Telephone Encounter (Signed)
Sent!

## 2016-06-16 ENCOUNTER — Other Ambulatory Visit: Payer: Self-pay | Admitting: Family Medicine

## 2016-06-16 NOTE — Telephone Encounter (Signed)
Sent. Thanks.   

## 2016-06-16 NOTE — Telephone Encounter (Signed)
Electronic refill request. Last Filled:    12 suppository 2 04/12/2016  Last office visit:   12/29/15  CPE  Please advise.

## 2016-06-23 ENCOUNTER — Other Ambulatory Visit (INDEPENDENT_AMBULATORY_CARE_PROVIDER_SITE_OTHER): Payer: 59

## 2016-06-23 DIAGNOSIS — E119 Type 2 diabetes mellitus without complications: Secondary | ICD-10-CM

## 2016-06-23 DIAGNOSIS — Z125 Encounter for screening for malignant neoplasm of prostate: Secondary | ICD-10-CM

## 2016-06-23 LAB — PSA: PSA: 0.47 ng/mL (ref 0.10–4.00)

## 2016-06-23 LAB — HEMOGLOBIN A1C: HEMOGLOBIN A1C: 6.7 % — AB (ref 4.6–6.5)

## 2016-06-27 ENCOUNTER — Encounter: Payer: Self-pay | Admitting: Internal Medicine

## 2016-06-27 ENCOUNTER — Ambulatory Visit (INDEPENDENT_AMBULATORY_CARE_PROVIDER_SITE_OTHER): Payer: 59 | Admitting: Internal Medicine

## 2016-06-27 DIAGNOSIS — G4733 Obstructive sleep apnea (adult) (pediatric): Secondary | ICD-10-CM

## 2016-06-27 NOTE — Patient Instructions (Signed)
Please bring your CPAP chip by so we can download it   Ok to continue your present CPAP settings as long as you are satisfied with your sleep and not snoring through it.  Please call if we can help

## 2016-06-27 NOTE — Progress Notes (Signed)
HPI- 03/22/11- 56 year old male former smoker followed for obstructive sleep apnea/CPAP   06/25/2015-56 year old male former smoker followed for OSA/CPAP, complicated by DM 2, muscle cramps CPAP 13/ Advanced FOLLOWS FOR:  pt. state he wears CPAP 8hr. everynight. pressure set @ 13 feels pressure is good. no supplies needed at this time. [place] Has diazepam used for muscle spasms/neck pain but occasionally he uses one to help himself sleep. Has not been a constant problem. Compliant with CPAP but admits to me pressure might be a little low at times  06/27/2016-56 year old male former smoker followed for OSA/CPAP, complicated by DM 2, muscle cramps CPAP 14/Advanced FOLLOWS FOR: DME [facility]. Pt wears CPAP nightly and no new supplies needed at this time. No DL and not in [employer].  He will bring download chip later. Since he left CPAP still at 13 after last visit and is comfortable with it now. Sleeps well with no snoring reported.  ROS- see HPI Constitutional:   No-   weight loss, night sweats, fevers, chills,  fatigue, lassitude. HEENT:   No-  headaches, difficulty swallowing, tooth/dental problems, sore throat,       No-  sneezing, itching, ear ache, nasal congestion, post nasal drip,  CV:  No-   chest pain, orthopnea, PND, swelling in lower extremities, anasarca, dizziness, palpitations Resp: No-   shortness of breath with exertion or at rest.              No-   productive cough,  No non-productive cough,  No-  coughing up of blood.              No-   change in color of mucus.  No- wheezing.   Skin: No-   rash or lesions. GI:  No-   heartburn, indigestion, abdominal pain, nausea, vomiting,  GU:  MS:  No-   joint pain or swelling.  . Neuro- nothing unusual  Psych:  No- change in mood or affect. No depression or anxiety.  No memory loss.  OBJ- General- Alert, Oriented, Affect-appropriate, calm, Distress- none acute, + overweight Skin- rash-none, lesions- none, excoriation-  none Lymphadenopathy- none Head- atraumatic            Eyes- Gross vision intact, PERRLA, conjunctivae clear secretions            Ears- Hearing, canals-normal            Nose- Clear, no-Septal dev, mucus, polyps, erosion, perforation             Throat- Mallampati III , mucosa clear , drainage- none, tonsils- atrophic Neck- flexible , trachea midline, no stridor , thyroid nl, carotid no bruit Chest - symmetrical excursion , unlabored           Heart/CV- RRR , no murmur , no gallop  , no rub, nl s1 s2                           - JVD- none , edema- none, stasis changes- none, varices- none           Lung- clear to P&A, wheeze- none, cough- none , dullness-none, rub- none           Chest wall-  Abd-  Br/ Gen/ Rectal- Not done, not indicated Extrem- cyanosis- none, clubbing, none, atrophy- none, strength- nl Neuro- grossly intact to observation

## 2016-06-27 NOTE — Assessment & Plan Note (Signed)
We think his pressure is still at 13. He needs to bring in download chip for our review. Machine is functioning well and he thinks symptomatically his quality of life is fine.

## 2016-07-01 ENCOUNTER — Ambulatory Visit (INDEPENDENT_AMBULATORY_CARE_PROVIDER_SITE_OTHER): Payer: 59 | Admitting: Family Medicine

## 2016-07-01 ENCOUNTER — Encounter: Payer: Self-pay | Admitting: Family Medicine

## 2016-07-01 DIAGNOSIS — L989 Disorder of the skin and subcutaneous tissue, unspecified: Secondary | ICD-10-CM | POA: Diagnosis not present

## 2016-07-01 DIAGNOSIS — E119 Type 2 diabetes mellitus without complications: Secondary | ICD-10-CM

## 2016-07-01 MED ORDER — BASAGLAR KWIKPEN 100 UNIT/ML ~~LOC~~ SOPN: PEN_INJECTOR | SUBCUTANEOUS | Status: DC

## 2016-07-01 NOTE — Progress Notes (Signed)
Pre visit review using our clinic review tool, if applicable. No additional management support is needed unless otherwise documented below in the visit note. 

## 2016-07-01 NOTE — Patient Instructions (Signed)
No med changes.  Physical in about 6 months.  Labs ahead of time.  Take care.  Glad to see you.  The spot should heal over.  Update me as needed.

## 2016-07-01 NOTE — Progress Notes (Signed)
Diabetes:  Using medications without difficulties:yes Hypoglycemic episodes:no Hyperglycemic episodes:no Feet problems:no Blood Sugars averaging: <150 eye exam within last year: done 06/18/16, no retinopathy.   A1c improved, he cut out soda.  D/w pt.   D/w pt about weight loss, encouraged him to continue his efforts.    PSA wnl, d/w pt.    L scalp lesion, wanted frozen, possible wart.  Not draining.    Meds, vitals, and allergies reviewed.   ROS: Per HPI unless specifically indicated in ROS section   GEN: nad, alert and oriented HEENT: mucous membranes moist NECK: supple w/o LA CV: rrr. PULM: ctab, no inc wob ABD: soft, +bs EXT: no edema SKIN: no acute rash but small irritated warty lesion on L side of scalp, 50mm across.  No ulceration.    Diabetic foot exam: Normal inspection No skin breakdown Calluses noted on B 1st toes.  Normal DP pulses Normal sensation to light touch and monofilament Nails normal

## 2016-07-02 ENCOUNTER — Other Ambulatory Visit: Payer: Self-pay | Admitting: Family Medicine

## 2016-07-04 DIAGNOSIS — L989 Disorder of the skin and subcutaneous tissue, unspecified: Secondary | ICD-10-CM | POA: Insufficient documentation

## 2016-07-04 NOTE — Assessment & Plan Note (Signed)
No change in meds.  Continue work on diet and exercise.  Recheck in about 6 months, sooner if needed. He agrees.  See AVS.

## 2016-07-04 NOTE — Assessment & Plan Note (Signed)
Looks like a benign warty lesion, could be a SK, d/w pt about ddx.  D/w pt about options. Reasonable to treat with liq N2, d/w pt.  He agrees.  Routine cautions and consideration d/w pt.  He consented.  Frozen x3 with liq N2, no complications, routine instructions given.  F/u if it doesn't fully heal over.  He agrees.

## 2016-12-26 ENCOUNTER — Other Ambulatory Visit: Payer: Self-pay | Admitting: Family Medicine

## 2016-12-26 ENCOUNTER — Other Ambulatory Visit (INDEPENDENT_AMBULATORY_CARE_PROVIDER_SITE_OTHER): Payer: 59

## 2016-12-26 DIAGNOSIS — E119 Type 2 diabetes mellitus without complications: Secondary | ICD-10-CM

## 2016-12-26 LAB — COMPREHENSIVE METABOLIC PANEL WITH GFR
ALT: 25 U/L (ref 0–53)
AST: 36 U/L (ref 0–37)
Albumin: 4.1 g/dL (ref 3.5–5.2)
Alkaline Phosphatase: 42 U/L (ref 39–117)
BUN: 23 mg/dL (ref 6–23)
CO2: 28 meq/L (ref 19–32)
Calcium: 9.1 mg/dL (ref 8.4–10.5)
Chloride: 106 meq/L (ref 96–112)
Creatinine, Ser: 1 mg/dL (ref 0.40–1.50)
GFR: 81.6 mL/min
Glucose, Bld: 97 mg/dL (ref 70–99)
Potassium: 4.3 meq/L (ref 3.5–5.1)
Sodium: 139 meq/L (ref 135–145)
Total Bilirubin: 0.4 mg/dL (ref 0.2–1.2)
Total Protein: 6.8 g/dL (ref 6.0–8.3)

## 2016-12-26 LAB — HEMOGLOBIN A1C: Hgb A1c MFr Bld: 6.6 % — ABNORMAL HIGH (ref 4.6–6.5)

## 2016-12-26 LAB — LIPID PANEL
Cholesterol: 122 mg/dL (ref 0–200)
HDL: 29.5 mg/dL — ABNORMAL LOW
LDL Cholesterol: 78 mg/dL (ref 0–99)
NonHDL: 92.51
Total CHOL/HDL Ratio: 4
Triglycerides: 75 mg/dL (ref 0.0–149.0)
VLDL: 15 mg/dL (ref 0.0–40.0)

## 2016-12-26 LAB — MICROALBUMIN / CREATININE URINE RATIO
Creatinine,U: 172.1 mg/dL
MICROALB UR: 1.2 mg/dL (ref 0.0–1.9)
Microalb Creat Ratio: 0.7 mg/g (ref 0.0–30.0)

## 2016-12-30 ENCOUNTER — Encounter: Payer: Self-pay | Admitting: Family Medicine

## 2016-12-30 ENCOUNTER — Ambulatory Visit (INDEPENDENT_AMBULATORY_CARE_PROVIDER_SITE_OTHER): Payer: 59 | Admitting: Family Medicine

## 2016-12-30 VITALS — BP 122/66 | HR 91 | Temp 98.6°F | Ht 72.0 in | Wt 246.5 lb

## 2016-12-30 DIAGNOSIS — E119 Type 2 diabetes mellitus without complications: Secondary | ICD-10-CM

## 2016-12-30 DIAGNOSIS — Z0000 Encounter for general adult medical examination without abnormal findings: Secondary | ICD-10-CM | POA: Diagnosis not present

## 2016-12-30 DIAGNOSIS — M792 Neuralgia and neuritis, unspecified: Secondary | ICD-10-CM

## 2016-12-30 MED ORDER — BASAGLAR KWIKPEN 100 UNIT/ML ~~LOC~~ SOPN: PEN_INJECTOR | SUBCUTANEOUS | 12 refills | Status: DC

## 2016-12-30 MED ORDER — PRAVASTATIN SODIUM 80 MG PO TABS: 80.0000 mg | ORAL_TABLET | Freq: Every day | ORAL | 3 refills | Status: DC

## 2016-12-30 MED ORDER — METFORMIN HCL 500 MG PO TABS: ORAL_TABLET | ORAL | 3 refills | Status: DC

## 2016-12-30 MED ORDER — SAXAGLIPTIN HCL 5 MG PO TABS: ORAL_TABLET | ORAL | 3 refills | Status: DC

## 2016-12-30 NOTE — Patient Instructions (Addendum)
I would get a flu shot each fall.   Call GI if you don't get a notice this fall.  Taper your insulin as needed.   Recheck A1c in about 6 months.  Take care.  Glad to see you.

## 2016-12-30 NOTE — Progress Notes (Signed)
CPE- See plan.  Routine anticipatory guidance given to patient.  See health maintenance.  The possibility exists that previously documented standard health maintenance information may have been brought forward from a previous encounter into this note.  If needed, that same information has been updated to reflect the current situation based on today's encounter.  Tetanus 2011 Flu done prev PNA 2014 Shingles not due yet Colon cancer screening- colonoscopy 2015 PSA prev wnl, d/w pt.   Living will d/w pt. Would have his wife designated if patient were incapacitated.  Diet and exercise d/w pt. Working on both. Weight loss noted.  30 pack year hx, he'll consider lung CA screening program, d/w pt.   Diabetes:  Using medications without difficulties: yes Hypoglycemic episodes:usually not since he lowered his insulin dose.  D/w pt about avoiding prolonged fasting. Hyperglycemic episodes:no Feet problems:no Blood Sugars averaging: yes eye exam within last year: due 05/2017, d/w pt.  Labs d/w pt.   Fungal rash controlled better now that sugar is better.   Down to ~70 units total per day.    Less spasms now and using BZD and tramadol less.  I left meds on his EMR.  No ADE either, both were better than flexeril.    PMH and SH reviewed  Meds, vitals, and allergies reviewed.   ROS: Per HPI.  Unless specifically indicated otherwise in HPI, the patient denies:  General: fever. Eyes: acute vision changes ENT: sore throat Cardiovascular: chest pain Respiratory: SOB GI: vomiting GU: dysuria Musculoskeletal: acute back pain Derm: acute rash Neuro: acute motor dysfunction Psych: worsening mood Endocrine: polydipsia Heme: bleeding Allergy: hayfever  GEN: nad, alert and oriented HEENT: mucous membranes moist NECK: supple w/o LA CV: rrr. PULM: ctab, no inc wob ABD: soft, +bs EXT: no edema SKIN: no acute rash  Diabetic foot exam: Normal inspection No skin breakdown Calluses noted  on medial L 1st toe.   Normal DP pulses Normal sensation to light touch and monofilament Nails normal

## 2017-01-01 NOTE — Assessment & Plan Note (Signed)
With history of spasm noted, using benzodiazepine and tramadol less. These medications worked better when needed previously. Update me as needed. He agrees.

## 2017-01-01 NOTE — Assessment & Plan Note (Signed)
Tetanus 2011 Flu done prev PNA 2014 Shingles not due yet Colon cancer screening- colonoscopy 2015 PSA prev wnl, d/w pt.   Living will d/w pt. Would have his wife designated if patient were incapacitated.  Diet and exercise d/w pt. Working on both. Weight loss noted.  30 pack year hx, he'll consider lung CA screening program, d/w pt.

## 2017-01-01 NOTE — Assessment & Plan Note (Signed)
He has less trouble with fungal infections and he is using less insulin now that his sugars better controlled. Continue work on diet and weight. Recheck labs in a few months. Update me as needed in the meantime. He agrees.

## 2017-01-03 ENCOUNTER — Other Ambulatory Visit: Payer: Self-pay | Admitting: Family Medicine

## 2017-01-13 ENCOUNTER — Other Ambulatory Visit: Payer: Self-pay | Admitting: Family Medicine

## 2017-01-14 ENCOUNTER — Other Ambulatory Visit: Payer: Self-pay | Admitting: Family Medicine

## 2017-01-16 DIAGNOSIS — R590 Localized enlarged lymph nodes: Secondary | ICD-10-CM | POA: Diagnosis not present

## 2017-01-16 DIAGNOSIS — J358 Other chronic diseases of tonsils and adenoids: Secondary | ICD-10-CM | POA: Diagnosis not present

## 2017-01-16 DIAGNOSIS — J029 Acute pharyngitis, unspecified: Secondary | ICD-10-CM | POA: Diagnosis not present

## 2017-01-21 ENCOUNTER — Other Ambulatory Visit: Payer: Self-pay | Admitting: Family Medicine

## 2017-01-27 DIAGNOSIS — G4733 Obstructive sleep apnea (adult) (pediatric): Secondary | ICD-10-CM | POA: Diagnosis not present

## 2017-03-08 ENCOUNTER — Other Ambulatory Visit: Payer: Self-pay | Admitting: Family Medicine

## 2017-04-18 ENCOUNTER — Encounter: Payer: Self-pay | Admitting: Internal Medicine

## 2017-05-01 ENCOUNTER — Other Ambulatory Visit: Payer: Self-pay | Admitting: Family Medicine

## 2017-06-29 ENCOUNTER — Other Ambulatory Visit: Payer: Self-pay | Admitting: Family Medicine

## 2017-06-29 DIAGNOSIS — E119 Type 2 diabetes mellitus without complications: Secondary | ICD-10-CM

## 2017-06-30 ENCOUNTER — Other Ambulatory Visit (INDEPENDENT_AMBULATORY_CARE_PROVIDER_SITE_OTHER): Payer: 59

## 2017-06-30 DIAGNOSIS — E119 Type 2 diabetes mellitus without complications: Secondary | ICD-10-CM

## 2017-06-30 LAB — HEMOGLOBIN A1C: HEMOGLOBIN A1C: 6.7 % — AB (ref 4.6–6.5)

## 2017-07-07 ENCOUNTER — Encounter: Payer: Self-pay | Admitting: Family Medicine

## 2017-07-07 ENCOUNTER — Ambulatory Visit: Payer: 59 | Admitting: Family Medicine

## 2017-07-07 DIAGNOSIS — E119 Type 2 diabetes mellitus without complications: Secondary | ICD-10-CM

## 2017-07-07 MED ORDER — BASAGLAR KWIKPEN 100 UNIT/ML ~~LOC~~ SOPN: PEN_INJECTOR | SUBCUTANEOUS | Status: DC

## 2017-07-07 NOTE — Patient Instructions (Signed)
Keep working on you diet and exercise for now and recheck in about 6 month with labs prior to a physical.  Thanks for your effort.  Take care.  Glad to see you.

## 2017-07-07 NOTE — Progress Notes (Signed)
Diabetes:  Using medications without difficulties: yes, down to 74 units per day.  Hypoglycemic episodes:no Hyperglycemic episodes:no Feet problems:no Blood Sugars averaging: usually ~110s in the AM eye exam within last year: due, d/w pt.  He'll call for appointment.  His weight is down from this time last year.   He is working on diet, cutting carbs.  He is adjusting to the diet.   Exercise is mainly at work, walking.    Mild tachycardia noted today.  He hadn't noted it.  Still regular on exam. D/w pt about cutting back on coffee and updating med if sx continue.    Meds, vitals, and allergies reviewed.   ROS: Per HPI unless specifically indicated in ROS section   GEN: nad, alert and oriented HEENT: mucous membranes moist NECK: supple w/o LA CV: rrr. PULM: ctab, no inc wob ABD: soft, +bs EXT: no edema  Diabetic foot exam: Normal inspection No skin breakdown No calluses  Normal DP pulses Normal sensation to light touch and monofilament Nails normal

## 2017-07-09 NOTE — Assessment & Plan Note (Signed)
His weight is down from this time last year.   He is working on diet, cutting carbs.  He is adjusting to the diet.   Exercise is mainly at work, walking.   Recheck in about 6 months.  He can taper insulin as his sugars come down with diet and exercise.    Mild tachycardia noted today.  He hadn't noted it.  Still regular on exam. D/w pt about cutting back on coffee and updating med if sx continue.   He agrees.  See avs.

## 2017-07-26 ENCOUNTER — Other Ambulatory Visit: Payer: Self-pay | Admitting: Family Medicine

## 2017-07-26 NOTE — Telephone Encounter (Signed)
Sent. Thanks.   

## 2017-07-26 NOTE — Telephone Encounter (Signed)
Electronic refill Last refill 03/09/17 #12/2 Last office visit 07/07/17

## 2017-08-22 LAB — HM DIABETES EYE EXAM

## 2017-09-06 ENCOUNTER — Ambulatory Visit: Payer: 59 | Admitting: Primary Care

## 2017-09-06 VITALS — BP 124/76 | HR 99 | Temp 98.7°F | Ht 72.0 in | Wt 243.5 lb

## 2017-09-06 DIAGNOSIS — J029 Acute pharyngitis, unspecified: Secondary | ICD-10-CM

## 2017-09-06 DIAGNOSIS — J101 Influenza due to other identified influenza virus with other respiratory manifestations: Secondary | ICD-10-CM

## 2017-09-06 DIAGNOSIS — R059 Cough, unspecified: Secondary | ICD-10-CM

## 2017-09-06 DIAGNOSIS — R05 Cough: Secondary | ICD-10-CM

## 2017-09-06 LAB — POC INFLUENZA A&B (BINAX/QUICKVUE)
INFLUENZA A, POC: POSITIVE — AB
Influenza B, POC: NEGATIVE

## 2017-09-06 LAB — POCT RAPID STREP A (OFFICE): Rapid Strep A Screen: NEGATIVE

## 2017-09-06 MED ORDER — OSELTAMIVIR PHOSPHATE 75 MG PO CAPS: 75.0000 mg | ORAL_CAPSULE | Freq: Two times a day (BID) | ORAL | 0 refills | Status: AC

## 2017-09-06 MED ORDER — BENZONATATE 200 MG PO CAPS: 200.0000 mg | ORAL_CAPSULE | Freq: Three times a day (TID) | ORAL | 0 refills | Status: AC | PRN

## 2017-09-06 NOTE — Progress Notes (Signed)
Subjective:    Patient ID: Edward Wright, male    DOB: 1960-08-29, 57 y.o.   MRN: [id number]  HPI  Edward Wright is a 57 year old male who presents today with a chief complaint of cough.  He also reports chills, feeling feverish, headaches, sore throat, voice hoarsness. His symptoms began 36 hours ago. He's been taking Alka-Seltzer Cold and Flu, and a night time cold relief. His daughter was diagnosed with influenza A yesterday, he's been in close contact with his daughter. He did have a flu shot this season.   Review of Systems  Constitutional: Positive for chills and fatigue.  HENT: Positive for congestion and sore throat. Negative for ear pain.   Respiratory: Positive for cough.        Past Medical History:  Diagnosis Date  . Bell's palsy    L sided with mild persistent lip droop  . BPH (benign prostatic hyperplasia) 07/1997  . Colon polyps    adenomatous  . Diabetes mellitus type II 01/2003  . Diverticulosis   . GERD (gastroesophageal reflux disease) 11/2002  . Heart murmur    history of  . Hyperlipemia 04/1989  . Internal hemorrhoids   . Peptic stricture of esophagus   . Sleep apnea    sleep study pos on CPAP  . Spermatocele      Social History   Socioeconomic History  . Marital status: Married    Spouse name: Not on file  . Number of children: 2  . Years of education: Not on file  . Highest education level: Not on file  Social Needs  . Financial resource strain: Not on file  . Food insecurity - worry: Not on file  . Food insecurity - inability: Not on file  . Transportation needs - medical: Not on file  . Transportation needs - non-medical: Not on file  Occupational History  . 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 Smoking status: Former Smoker    Packs/day: 1.00    Years: 10.00    Pack years: 10.00    Types: Cigarettes    Last attempt to quit: 01/21/2014    Years since quitting:  3.6  . Smokeless tobacco: Never Used  Substance and Sexual Activity  . Alcohol use: Yes    Alcohol/week: 0.0 oz    Comment: 1 beer a month  . Drug use: No  . Sexual activity: Not on file  Other Topics Concern  . 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 CHOLECYSTECTOMY  01/05/2004  . COLONOSCOPY    . CYSTECTOMY Right 1990   ganglion wrist  . DOPPLER ECHOCARDIOGRAPHY  01/13/2004   normal  . hematuria  12/1994   w/u neg (urol)  . VASECTOMY  05/26/2006    Family History  Problem Relation Age of Onset  . Hypertension Mother   . Heart disease Mother        CAD, MI, stents x 2-3  . Transient ischemic attack Mother   . Dementia Mother   . Cancer Father        multiple myeloma  . Cancer Other        unknown type  . Colon cancer Neg Hx   .  Prostate cancer Neg Hx   . Esophageal cancer Neg Hx   . Rectal cancer Neg Hx   . Stomach cancer Neg Hx     Allergies  Allergen Reactions  . Flexeril [Cyclobenzaprine] Other (See Comments)    Sedation caution.   . Hydrocodone Nausea Only    Sweating    Current Outpatient Medications on File Prior to Visit  Medication Sig Dispense Refill  . B-D UF III MINI PEN NEEDLES 31G X 5 MM MISC USE DAILY WITH INSULIN PEN 100 each 2  . Blood Glucose Monitoring Suppl (ONETOUCH VERIO FLEX SYSTEM) W/DEVICE KIT 1 kit by Other route 2 (two) times daily as needed. Check blood sugar twice daily and as directed.  Insulin Dependent.  Dx:  E11.9 1 kit 0  . clotrimazole (LOTRIMIN) 1 % cream APPLY TOPICALLY 2 (TWO) TIMES DAILY. (Patient taking differently: APPLY TOPICALLY 2 (TWO) TIMES DAILY.AS NEEDED) 30 g 3  . diazepam (VALIUM) 5 MG tablet Take 1 tablet (5 mg total) by mouth 3 (three) times daily as needed. 30 tablet 1  . hydrocortisone (ANUSOL-HC) 25 MG suppository UNWRAP AND PLACE 1  SUPPOSITORY (25 MG TOTAL) RECTALLY 3 (THREE) TIMES DAILY AS NEEDED. 12 suppository 2  . Insulin Glargine (BASAGLAR KWIKPEN) 100 UNIT/ML SOPN INJECT UP TO 80 UNITS INTO THE SKIN PER DAY    . meclizine (ANTIVERT) 25 MG tablet Take 1 tablet (25 mg total) by mouth 3 (three) times daily as needed for dizziness. 90 tablet 1  . metFORMIN (GLUCOPHAGE) 500 MG tablet TAKE 2 TABLETS (1,000 MG TOTAL) BY MOUTH 2 (TWO) TIMES DAILY WITH A MEAL. 360 tablet 3  . NON FORMULARY CPAP 13 Advanced, use as directed     . omeprazole (PRILOSEC) 40 MG capsule TAKE 1 CAPSULE BY MOUTH DAILY. TAKE 1/2 HOUR BEFORE BREAKFAST EVERY DAY. 90 capsule 1  . ONETOUCH VERIO test strip USE TO TEST BLOOD SUGAR 1-2 TIMES DAILY 100 each 3  . pravastatin (PRAVACHOL) 80 MG tablet TAKE 1 TABLET (80 MG TOTAL) BY MOUTH AT BEDTIME. 90 tablet 2  . ranitidine (ZANTAC) 150 MG capsule Take 150 mg by mouth 2 (two) times daily as needed.     . saxagliptin HCl (ONGLYZA) 5 MG TABS tablet TAKE 1 TABLET (5 MG TOTAL) BY MOUTH DAILY. 90 tablet 3  . traMADol (ULTRAM) 50 MG tablet TAKE 1 TABLET BY MOUTH 3 TIMES A DAY AS NEEDED 30 tablet 1   No current facility-administered medications on file prior to visit.     BP 124/76   Pulse 99   Temp 98.7 F (37.1 C) (Oral)   Ht 6' (1.829 m)   Wt 243 lb 8 oz (110.5 kg)   SpO2 93%   BMI 33.02 kg/m    Objective:   Physical Exam  Constitutional: He appears well-nourished. He appears ill.  HENT:  Right Ear: Tympanic membrane and ear canal normal.  Left Ear: Tympanic membrane and ear canal normal.  Nose: No mucosal edema. Right sinus exhibits no maxillary sinus tenderness and no frontal sinus tenderness. Left sinus exhibits no maxillary sinus tenderness and no frontal sinus tenderness.  Mouth/Throat: Oropharynx is clear and moist.  Eyes: Conjunctivae are normal.  Neck: Neck supple.  Cardiovascular: Normal rate and regular rhythm.  Pulmonary/Chest: Effort normal. He has wheezes in the left upper field. He  has no rales.  Mild wheezing to left upper field. Congested cough during exam.  Lymphadenopathy:    He has cervical adenopathy.  Skin: Skin is warm  and dry.          Assessment & Plan:  Influenza A:  Cough, chills, fatigue x 36 hours. In close contact with his daughter who was diagnosed with influenza yesterday. Exam today consistent for influenza, he does appear stable.  Rapid Flu: Positive Rapid Strep: Negative Rx for Tamiflu course and Tessalon Perles sent to pharmacy. Fluids, rest, follow up PRN.  Pleas Koch, NP

## 2017-09-06 NOTE — Patient Instructions (Addendum)
You have the flu. We have sent a prescription for Tamiflu. Please take 1 (one) capsule twice a day for the next 5 days. Be sure to drink 64oz of water every day and get plenty of rest.   For fever or pain, you can take Tylenol 650mg  every 4hrs as needed or Ibuprofen 400mg  every 4 hours as needed. Do not exceed 3000mg  of Tylenol or 24000mg  of Ibuprofen per day  We have also prescribed Tessalon Pearls to help with cough.  Please follow-up if symptoms do not improve in the next 3 days or sooner if symptoms get worse  It has been a pleasure seeing you today. Denita Lung, RN, Adult-Geriatric Nurse Practitioner Student and Allie Bossier, AGNP   Influenza, Adult Influenza ("the flu") is an infection in the lungs, nose, and throat (respiratory tract). It is caused by a virus. The flu causes many common cold symptoms, as well as a high fever and body aches. It can make you feel very sick. The flu spreads easily from person to person (is contagious). Getting a flu shot (influenza vaccination) every year is the best way to prevent the flu. Follow these instructions at home:  Take over-the-counter and prescription medicines only as told by your doctor.  Use a cool mist humidifier to add moisture (humidity) to the air in your home. This can make it easier to breathe.  Rest as needed.  Drink enough fluid to keep your pee (urine) clear or pale yellow.  Cover your mouth and nose when you cough or sneeze.  Wash your hands with soap and water often, especially after you cough or sneeze. If you cannot use soap and water, use hand sanitizer.  Stay home from work or school as told by your doctor. Unless you are visiting your doctor, try to avoid leaving home until your fever has been gone for 24 hours without the use of medicine.  Keep all follow-up visits as told by your doctor. This is important. How is this prevented?  Getting a yearly (annual) flu shot is the best way to avoid getting the flu.  You may get the flu shot in late summer, fall, or winter. Ask your doctor when you should get your flu shot.  Wash your hands often or use hand sanitizer often.  Avoid contact with people who are sick during cold and flu season.  Eat healthy foods.  Drink plenty of fluids.  Get enough sleep.  Exercise regularly. Contact a doctor if:  You get new symptoms.  You have: ? Chest pain. ? Watery poop (diarrhea). ? A fever.  Your cough gets worse.  You start to have more mucus.  You feel sick to your stomach (nauseous).  You throw up (vomit). Get help right away if:  You start to be short of breath or have trouble breathing.  Your skin or nails turn a bluish color.  You have very bad pain or stiffness in your neck.  You get a sudden headache.  You get sudden pain in your face or ear.  You cannot stop throwing up. This information is not intended to replace advice given to you by your health care provider. Make sure you discuss any questions you have with your health care provider. Document Released: 03/15/2008 Document Revised: 11/12/2015 Document Reviewed: 03/31/2015 Elsevier Interactive Patient Education  2017 Elsevier Inc.  Oseltamivir capsules What is this medicine? OSELTAMIVIR (os el TAM i vir) is an antiviral medicine. It is used to prevent and to treat some  kinds of influenza or the flu. It will not work for colds or other viral infections. This medicine may be used for other purposes; ask your health care provider or pharmacist if you have questions. COMMON BRAND NAME(S): Tamiflu What should I tell my health care provider before I take this medicine? They need to know if you have any of the following conditions: -heart disease -immune system problems -kidney disease -liver disease -lung disease -an unusual or allergic reaction to oseltamivir, other medicines, foods, dyes, or preservatives -pregnant or trying to get pregnant -breast-feeding How should I  use this medicine? Take this medicine by mouth with a glass of water. Follow the directions on the prescription label. Start this medicine at the first sign of flu symptoms. You can take it with or without food. If it upsets your stomach, take it with food. Take your medicine at regular intervals. Do not take your medicine more often than directed. Take all of your medicine as directed even if you think you are better. Do not skip doses or stop your medicine early. Talk to your pediatrician regarding the use of this medicine in children. While this drug may be prescribed for children as young as 14 days for selected conditions, precautions do apply. Overdosage: If you think you have taken too much of this medicine contact a poison control center or emergency room at once. NOTE: This medicine is only for you. Do not share this medicine with others. What if I miss a dose? If you miss a dose, take it as soon as you remember. If it is almost time for your next dose (within 2 hours), take only that dose. Do not take double or extra doses. What may interact with this medicine? Interactions are not expected. This list may not describe all possible interactions. Give your health care provider a list of all the medicines, herbs, non-prescription drugs, or dietary supplements you use. Also tell them if you smoke, drink alcohol, or use illegal drugs. Some items may interact with your medicine. What should I watch for while using this medicine? Visit your doctor or health care professional for regular check ups. Tell your doctor if your symptoms do not start to get better or if they get worse. If you have the flu, you may be at an increased risk of developing seizures, confusion, or abnormal behavior. This occurs early in the illness, and more frequently in children and teens. These events are not common, but may result in accidental injury to the patient. Families and caregivers of patients should watch for signs  of unusual behavior and contact a doctor or health care professional right away if the patient shows signs of unusual behavior. This medicine is not a substitute for the flu shot. Talk to your doctor each year about an annual flu shot. What side effects may I notice from receiving this medicine? Side effects that you should report to your doctor or health care professional as soon as possible: -allergic reactions like skin rash, itching or hives, swelling of the face, lips, or tongue -anxiety, confusion, unusual behavior -breathing problems -hallucination, loss of contact with reality -redness, blistering, peeling or loosening of the skin, including inside the mouth -seizures Side effects that usually do not require medical attention (report to your doctor or health care professional if they continue or are bothersome): -diarrhea -headache -nausea, vomiting -pain This list may not describe all possible side effects. Call your doctor for medical advice about side effects. You may report side  effects to FDA at 1-800-FDA-1088. Where should I keep my medicine? Keep out of the reach of children. Store at room temperature between 15 and 30 degrees C (59 and 86 degrees F). Throw away any unused medicine after the expiration date. NOTE: This sheet is a summary. It may not cover all possible information. If you have questions about this medicine, talk to your doctor, pharmacist, or health care provider.  2018 Elsevier/Gold Standard (2014-12-10 10:50:39) Benzonatate capsules What is this medicine? BENZONATATE (ben ZOE na tate) is used to treat cough. This medicine may be used for other purposes; ask your health care provider or pharmacist if you have questions. COMMON BRAND NAME(S): Tessalon Perles, Zonatuss What should I tell my health care provider before I take this medicine? They need to know if you have any of these conditions: -kidney or liver disease -an unusual or allergic reaction to  benzonatate, anesthetics, other medicines, foods, dyes, or preservatives -pregnant or trying to get pregnant -breast-feeding How should I use this medicine? Take this medicine by mouth with a glass of water. Follow the directions on the prescription label. Avoid breaking, chewing, or sucking the capsule, as this can cause serious side effects. Take your medicine at regular intervals. Do not take your medicine more often than directed. Talk to your pediatrician regarding the use of this medicine in children. While this drug may be prescribed for children as young as 71 years old for selected conditions, precautions do apply. Overdosage: If you think you have taken too much of this medicine contact a poison control center or emergency room at once. NOTE: This medicine is only for you. Do not share this medicine with others. What if I miss a dose? If you miss a dose, take it as soon as you can. If it is almost time for your next dose, take only that dose. Do not take double or extra doses. What may interact with this medicine? Do not take this medicine with any of the following medications: -MAOIs like Carbex, Eldepryl, Marplan, Nardil, and Parnate This list may not describe all possible interactions. Give your health care provider a list of all the medicines, herbs, non-prescription drugs, or dietary supplements you use. Also tell them if you smoke, drink alcohol, or use illegal drugs. Some items may interact with your medicine. What should I watch for while using this medicine? Tell your doctor if your symptoms do not improve or if they get worse. If you have a high fever, skin rash, or headache, see your health care professional. You may get drowsy or dizzy. Do not drive, use machinery, or do anything that needs mental alertness until you know how this medicine affects you. Do not sit or stand up quickly, especially if you are an older patient. This reduces the risk of dizzy or fainting spells. What  side effects may I notice from receiving this medicine? Side effects that you should report to your doctor or health care professional as soon as possible: -allergic reactions like skin rash, itching or hives, swelling of the face, lips, or tongue -breathing problems -chest pain -confusion or hallucinations -irregular heartbeat -numbness of mouth or throat -seizures Side effects that usually do not require medical attention (report to your doctor or health care professional if they continue or are bothersome): -burning feeling in the eyes -constipation -headache -nasal congestion -stomach upset This list may not describe all possible side effects. Call your doctor for medical advice about side effects. You may report side effects to  FDA at 1-800-FDA-1088. Where should I keep my medicine? Keep out of the reach of children. Store at room temperature between 15 and 30 degrees C (59 and 86 degrees F). Keep tightly closed. Protect from light and moisture. Throw away any unused medicine after the expiration date. NOTE: This sheet is a summary. It may not cover all possible information. If you have questions about this medicine, talk to your doctor, pharmacist, or health care provider.  2018 Elsevier/Gold Standard (2007-09-05 14:52:56)

## 2017-09-06 NOTE — Addendum Note (Signed)
Addended by: Jacqualin Combes on: 09/06/2017 12:57 PM   Modules accepted: Orders

## 2017-09-06 NOTE — Progress Notes (Signed)
Subjective:    Patient ID: Edward Wright, male    DOB: 18-Mar-1961, 57 y.o.   MRN: [id number]  HPI Edward Wright is a 57 y.o. male who presents today with a CC of Cough that started two nights ago and has gotten worse over last 24hrs. He also complains of fever, chills, headache, SOB, and hoarseness. He has been taking Alka-seltzer Cold and Flu with some relief and has been taking a PM version to help sleep at night. He does report that his daughter was diagnosed with Influenza A yesterday and was recently around her.  Review of Systems  Constitutional: Positive for fatigue.  HENT: Positive for congestion, postnasal drip, sinus pressure and sore throat. Negative for ear pain.   Respiratory: Negative for chest tightness.   Cardiovascular: Negative for chest pain.       Past Medical History:  Diagnosis Date  . Bell's palsy    L sided with mild persistent lip droop  . BPH (benign prostatic hyperplasia) 07/1997  . Colon polyps    adenomatous  . Diabetes mellitus type II 01/2003  . Diverticulosis   . GERD (gastroesophageal reflux disease) 11/2002  . Heart murmur    history of  . Hyperlipemia 04/1989  . Internal hemorrhoids   . Peptic stricture of esophagus   . Sleep apnea    sleep study pos on CPAP  . Spermatocele    Past Surgical History:  Procedure Laterality Date  . CHOLECYSTECTOMY  01/05/2004  . COLONOSCOPY    . CYSTECTOMY Right 1990   ganglion wrist  . DOPPLER ECHOCARDIOGRAPHY  01/13/2004   normal  . hematuria  12/1994   w/u neg (urol)  . VASECTOMY  05/26/2006   Social History   Socioeconomic History  . Marital status: Married    Spouse name: Not on file  . Number of children: 2  . Years of education: Not on file  . Highest education level: Not on file  Social Needs  . Financial resource strain: Not on file  . Food insecurity - worry: Not on file  . Food insecurity - inability: Not on file  . Transportation needs - medical: Not on file  .  Transportation needs - non-medical: Not on file  Occupational History  . 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 Smoking status: Former Smoker    Packs/day: 1.00    Years: 10.00    Pack years: 10.00    Types: Cigarettes    Last attempt to quit: 01/21/2014    Years since quitting: 3.6  . Smokeless tobacco: Never Used  Substance and Sexual Activity  . Alcohol use: Yes    Alcohol/week: 0.0 oz    Comment: 1 beer a month  . Drug use: No  . Sexual activity: Not on file  Other Topics Concern  . 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 Hypertension Mother   . Heart disease Mother        CAD, MI, stents x 2-3  . Transient ischemic attack Mother   . Dementia Mother   . Cancer Father        multiple myeloma  . Cancer Other  unknown type  . Colon cancer Neg Hx   . Prostate cancer Neg Hx   . Esophageal cancer Neg Hx   . Rectal cancer Neg Hx   . Stomach cancer Neg Hx    Current Outpatient Medications on File Prior to Visit  Medication Sig Dispense Refill  . B-D UF III MINI PEN NEEDLES 31G X 5 MM MISC USE DAILY WITH INSULIN PEN 100 each 2  . Blood Glucose Monitoring Suppl (ONETOUCH VERIO FLEX SYSTEM) W/DEVICE KIT 1 kit by Other route 2 (two) times daily as needed. Check blood sugar twice daily and as directed.  Insulin Dependent.  Dx:  E11.9 1 kit 0  . clotrimazole (LOTRIMIN) 1 % cream APPLY TOPICALLY 2 (TWO) TIMES DAILY. (Patient taking differently: APPLY TOPICALLY 2 (TWO) TIMES DAILY.AS NEEDED) 30 g 3  . diazepam (VALIUM) 5 MG tablet Take 1 tablet (5 mg total) by mouth 3 (three) times daily as needed. 30 tablet 1  . hydrocortisone (ANUSOL-HC) 25 MG suppository UNWRAP AND PLACE 1 SUPPOSITORY (25 MG TOTAL) RECTALLY 3 (THREE) TIMES DAILY AS NEEDED. 12  suppository 2  . Insulin Glargine (BASAGLAR KWIKPEN) 100 UNIT/ML SOPN INJECT UP TO 80 UNITS INTO THE SKIN PER DAY    . meclizine (ANTIVERT) 25 MG tablet Take 1 tablet (25 mg total) by mouth 3 (three) times daily as needed for dizziness. 90 tablet 1  . metFORMIN (GLUCOPHAGE) 500 MG tablet TAKE 2 TABLETS (1,000 MG TOTAL) BY MOUTH 2 (TWO) TIMES DAILY WITH A MEAL. 360 tablet 3  . NON FORMULARY CPAP 13 Advanced, use as directed     . omeprazole (PRILOSEC) 40 MG capsule TAKE 1 CAPSULE BY MOUTH DAILY. TAKE 1/2 HOUR BEFORE BREAKFAST EVERY DAY. 90 capsule 1  . ONETOUCH VERIO test strip USE TO TEST BLOOD SUGAR 1-2 TIMES DAILY 100 each 3  . pravastatin (PRAVACHOL) 80 MG tablet TAKE 1 TABLET (80 MG TOTAL) BY MOUTH AT BEDTIME. 90 tablet 2  . ranitidine (ZANTAC) 150 MG capsule Take 150 mg by mouth 2 (two) times daily as needed.     . saxagliptin HCl (ONGLYZA) 5 MG TABS tablet TAKE 1 TABLET (5 MG TOTAL) BY MOUTH DAILY. 90 tablet 3  . traMADol (ULTRAM) 50 MG tablet TAKE 1 TABLET BY MOUTH 3 TIMES A DAY AS NEEDED 30 tablet 1   No current facility-administered medications on file prior to visit.     Objective:   Physical Exam  Constitutional: He appears well-nourished. He appears ill. No distress.  HENT:  Right Ear: Tympanic membrane normal. Tympanic membrane is not bulging.  Left Ear: Tympanic membrane normal. Tympanic membrane is not bulging.  Nose: Nose normal. No mucosal edema. Right sinus exhibits no maxillary sinus tenderness and no frontal sinus tenderness. Left sinus exhibits no maxillary sinus tenderness and no frontal sinus tenderness.  Mouth/Throat: Oropharynx is clear and moist and mucous membranes are normal.  Cardiovascular: Normal rate, regular rhythm and normal heart sounds. Exam reveals no gallop and no friction rub.  No murmur heard. Pulmonary/Chest: Breath sounds normal. Accessory muscle usage present.  Lymphadenopathy:       Head (right side): Tonsillar adenopathy present.       Head  (left side): Tonsillar adenopathy present.    He has no cervical adenopathy.    BP 124/76   Pulse 99   Temp 98.7 F (37.1 C) (Oral)   Ht 6' (1.829 m)   Wt 243 lb 8 oz (110.5 kg)   SpO2 93%   BMI 33.02  kg/m      Assessment & Plan:   1. Influenza A - Flu swab positive for Influenza A  - oseltamivir (TAMIFLU) 75 MG capsule; Take 1 capsule (75 mg total) by mouth 2 (two) times daily for 5 days.  Dispense: 10 capsule; Refill: 0 - benzonatate (TESSALON) 200 MG capsule; Take 1 capsule (200 mg total) by mouth 3 (three) times daily as needed for up to 7 days for cough.  Dispense: 21 capsule; Refill: 0  2. Cough - benzonatate (TESSALON) 200 MG capsule; Take 1 capsule (200 mg total) by mouth 3 (three) times daily as needed for up to 7 days for cough.  Dispense: 21 capsule;   Denita Lung, RN, Adult-Geriatric Nurse Practitioner Student

## 2017-12-15 ENCOUNTER — Other Ambulatory Visit: Payer: Self-pay | Admitting: Family Medicine

## 2017-12-18 ENCOUNTER — Other Ambulatory Visit: Payer: Self-pay | Admitting: Family Medicine

## 2017-12-18 DIAGNOSIS — E119 Type 2 diabetes mellitus without complications: Secondary | ICD-10-CM

## 2017-12-29 ENCOUNTER — Other Ambulatory Visit (INDEPENDENT_AMBULATORY_CARE_PROVIDER_SITE_OTHER): Payer: 59

## 2017-12-29 DIAGNOSIS — E119 Type 2 diabetes mellitus without complications: Secondary | ICD-10-CM

## 2017-12-29 LAB — COMPREHENSIVE METABOLIC PANEL
ALBUMIN: 4.3 g/dL (ref 3.5–5.2)
ALT: 25 U/L (ref 0–53)
AST: 21 U/L (ref 0–37)
Alkaline Phosphatase: 42 U/L (ref 39–117)
BUN: 14 mg/dL (ref 6–23)
CHLORIDE: 104 meq/L (ref 96–112)
CO2: 28 meq/L (ref 19–32)
Calcium: 9.2 mg/dL (ref 8.4–10.5)
Creatinine, Ser: 1.02 mg/dL (ref 0.40–1.50)
GFR: 79.47 mL/min (ref >60.00)
GLUCOSE: 126 mg/dL — AB (ref 70–99)
Potassium: 4.5 mEq/L (ref 3.5–5.1)
SODIUM: 141 meq/L (ref 135–145)
Total Bilirubin: 0.4 mg/dL (ref 0.2–1.2)
Total Protein: 7 g/dL (ref 6.0–8.3)

## 2017-12-29 LAB — LIPID PANEL
CHOL/HDL RATIO: 5
CHOLESTEROL: 130 mg/dL (ref 0–200)
HDL: 26.7 mg/dL — ABNORMAL LOW (ref >39.00)
LDL CALC: 72 mg/dL (ref 0–99)
NONHDL: 103.52
Triglycerides: 158 mg/dL — ABNORMAL HIGH (ref 0.0–149.0)
VLDL: 31.6 mg/dL (ref 0.0–40.0)

## 2017-12-29 LAB — HEMOGLOBIN A1C: Hgb A1c MFr Bld: 6.9 % — ABNORMAL HIGH (ref 4.6–6.5)

## 2018-01-05 ENCOUNTER — Encounter: Payer: 59 | Admitting: Family Medicine

## 2018-01-16 ENCOUNTER — Other Ambulatory Visit: Payer: Self-pay | Admitting: Family Medicine

## 2018-01-17 ENCOUNTER — Ambulatory Visit (INDEPENDENT_AMBULATORY_CARE_PROVIDER_SITE_OTHER): Payer: 59 | Admitting: Family Medicine

## 2018-01-17 ENCOUNTER — Encounter: Payer: Self-pay | Admitting: Family Medicine

## 2018-01-17 ENCOUNTER — Other Ambulatory Visit: Payer: Self-pay | Admitting: Family Medicine

## 2018-01-17 VITALS — BP 114/60 | HR 86 | Temp 98.6°F | Ht 72.0 in | Wt 247.0 lb

## 2018-01-17 DIAGNOSIS — Z7189 Other specified counseling: Secondary | ICD-10-CM

## 2018-01-17 DIAGNOSIS — Z0000 Encounter for general adult medical examination without abnormal findings: Secondary | ICD-10-CM | POA: Diagnosis not present

## 2018-01-17 DIAGNOSIS — E119 Type 2 diabetes mellitus without complications: Secondary | ICD-10-CM | POA: Diagnosis not present

## 2018-01-17 DIAGNOSIS — M792 Neuralgia and neuritis, unspecified: Secondary | ICD-10-CM

## 2018-01-17 DIAGNOSIS — E785 Hyperlipidemia, unspecified: Secondary | ICD-10-CM

## 2018-01-17 DIAGNOSIS — R1310 Dysphagia, unspecified: Secondary | ICD-10-CM

## 2018-01-17 MED ORDER — TRAMADOL HCL 50 MG PO TABS: 50.0000 mg | ORAL_TABLET | Freq: Three times a day (TID) | ORAL | 1 refills | Status: DC | PRN

## 2018-01-17 MED ORDER — DIAZEPAM 5 MG PO TABS: 5.0000 mg | ORAL_TABLET | Freq: Three times a day (TID) | ORAL | 1 refills | Status: DC | PRN

## 2018-01-17 MED ORDER — SAXAGLIPTIN HCL 5 MG PO TABS: ORAL_TABLET | ORAL | 3 refills | Status: DC

## 2018-01-17 MED ORDER — PRAVASTATIN SODIUM 80 MG PO TABS: 80.0000 mg | ORAL_TABLET | Freq: Every day | ORAL | 3 refills | Status: DC

## 2018-01-17 NOTE — Patient Instructions (Addendum)
Go to the lab on the way out.  We'll contact you with your lab report. Take care.  Glad to see you.  Recheck in about 6 months.  The only lab you need to have done for your next diabetic visit is an A1c.  We can do this with a fingerstick test at the office visit.  You do not need a lab visit ahead of time for this.  It does not matter if you are fasting when the lab is done.   I would get a flu shot each fall.

## 2018-01-17 NOTE — Progress Notes (Signed)
CPE- See plan.  Routine anticipatory guidance given to patient.  See health maintenance.  The possibility exists that previously documented standard health maintenance information may have been brought forward from a previous encounter into this note.  If needed, that same information has been updated to reflect the current situation based on today's encounter.    Tetanus 2011 Flu done yearly PNA 2014 Shingles not due yet Colon cancer screening- colonoscopy 2015, f/u pending.  I'll defer to GI.   Prostate cancer screening and PSA options (with potential risks and benefits of testing vs not testing) were discussed along with recent recs/guidelines.  He declined testing PSA at this point. Living will d/w pt. Would have his wife designated if patient were incapacitated.  Diet and exercise d/w pt. Working on both.   30 pack year hx, he'll consider lung CA screening program, d/w pt. he'll update me as needed.    He is going to talk to GI about episodic dysphagia.   I'll defer to pt and GI.  He agrees.   Diabetes:  Using medications without difficulties: yes, usually ~67 units recently total dose per day with 50/50 split with AM and PM dosing.   Hypoglycemic episodes: rare episodes, cautions d/w pt.   Hyperglycemic episodes: no Feet problems: no Blood Sugars averaging: ~130s.   eye exam within last year: done ~08/2017, no retinopathy per patient report.  A1c controlled, d/w pt.  6.9.    Elevated Cholesterol: Using medications without problems: yes Muscle aches: no Diet compliance: yes Exercise:yes  He had used BZD and tramadol when he had sig neck and back pain with spasms noted.  meds helped w/o ADE.  Rare use.  Initial rx voided and reprinted for both.  Witnessed by [employer].    PMH and SH reviewed  Meds, vitals, and allergies reviewed.   ROS: Per HPI.  Unless specifically indicated otherwise in HPI, the patient denies:  General: fever. Eyes: acute vision changes ENT: sore  throat Cardiovascular: chest pain Respiratory: SOB GI: vomiting GU: dysuria Musculoskeletal: acute back pain Derm: acute rash Neuro: acute motor dysfunction Psych: worsening mood Endocrine: polydipsia Heme: bleeding Allergy: hayfever  GEN: nad, alert and oriented HEENT: mucous membranes moist NECK: supple w/o LA CV: rrr. PULM: ctab, no inc wob ABD: soft, +bs EXT: no edema SKIN: no acute rash  Diabetic foot exam: Normal inspection No skin breakdown No calluses  Normal DP pulses Normal sensation to light touch and monofilament Nails normal

## 2018-01-18 LAB — MICROALBUMIN / CREATININE URINE RATIO
Creatinine,U: 155.4 mg/dL
Microalb Creat Ratio: 0.5 mg/g (ref 0.0–30.0)
Microalb, Ur: <0.7 mg/dL (ref 0.0–1.9)

## 2018-01-18 NOTE — Assessment & Plan Note (Signed)
He has episodic flares of sx that had responded to tramadol and Valium.  No adverse effect on medications.  Use rarely.  No symptoms now.  Prescriptions printed and given to patient he has return of symptoms.  Update me as needed.  He agrees.

## 2018-01-18 NOTE — Assessment & Plan Note (Signed)
Labs discussed with patient.  Triglyceride elevation noted.  Would continue statin.  Continue work on diet and exercise and weight.

## 2018-01-18 NOTE — Assessment & Plan Note (Signed)
Wife designated if patient were incapacitated.  

## 2018-01-18 NOTE — Assessment & Plan Note (Signed)
Tetanus 2011 Flu done yearly PNA 2014 Shingles not due yet Colon cancer screening- colonoscopy 2015, f/u pending.  I'll defer to GI.   Prostate cancer screening and PSA options (with potential risks and benefits of testing vs not testing) were discussed along with recent recs/guidelines.  He declined testing PSA at this point. Living will d/w pt. Would have his wife designated if patient were incapacitated.  Diet and exercise d/w pt. Working on both.   30 pack year hx, he'll consider lung CA screening program, d/w pt. he'll update me as needed.

## 2018-01-18 NOTE — Assessment & Plan Note (Addendum)
A1c controlled, d/w pt.  6.9.  No change in meds.  Continue work on diet and exercise.  Recheck in about 6 months.  Update me sooner as needed.  He agrees.  Check microalbumin on the way out today.

## 2018-01-18 NOTE — Assessment & Plan Note (Signed)
He will follow-up with GI.  I will defer.  He agrees.

## 2018-01-19 ENCOUNTER — Ambulatory Visit: Payer: 59 | Admitting: Internal Medicine

## 2018-01-19 ENCOUNTER — Encounter: Payer: Self-pay | Admitting: Internal Medicine

## 2018-01-19 ENCOUNTER — Encounter: Payer: Self-pay | Admitting: Family Medicine

## 2018-01-19 VITALS — BP 118/70 | HR 76 | Ht 72.0 in | Wt 250.0 lb

## 2018-01-19 DIAGNOSIS — K219 Gastro-esophageal reflux disease without esophagitis: Secondary | ICD-10-CM

## 2018-01-19 DIAGNOSIS — K222 Esophageal obstruction: Secondary | ICD-10-CM

## 2018-01-19 DIAGNOSIS — R1310 Dysphagia, unspecified: Secondary | ICD-10-CM | POA: Diagnosis not present

## 2018-01-19 DIAGNOSIS — Z86010 Personal history of colonic polyps: Secondary | ICD-10-CM | POA: Diagnosis not present

## 2018-01-19 MED ORDER — SUPREP BOWEL PREP KIT 17.5-3.13-1.6 GM/177ML PO SOLN: 1.0000 | ORAL | 0 refills | Status: DC

## 2018-01-19 MED ORDER — OMEPRAZOLE 40 MG PO CPDR: 40.0000 mg | DELAYED_RELEASE_CAPSULE | Freq: Every day | ORAL | 11 refills | Status: DC

## 2018-01-19 NOTE — Progress Notes (Signed)
HISTORY OF PRESENT ILLNESS:  Edward Wright"is a 57 y.o. male chronic smoker with diabetes, hyperlipidemia, and sleep apnea who presents today regarding ongoing problems with dysphagia and the need for surveillance colonoscopy. Patient underwent routine screening colonoscopy 03/21/2014. He was found to have multiple adenomatous colon polyps and sessile serrated polyp for which colonoscopy was recommended in 3 years. Also moderate sigmoid diverticulosis. Patient also underwent upper endoscopy 04/04/2014 for dysphagia. 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 He was placed on omeprazole and seen in follow-up. He was having some residual dysphagia for which repeat endoscopy with dilation was recommended. However he has not followed up until this time. Some point he came off PPI concerned about potential side effects, though he was having no problems. His dysphagia continues to be intermittent solid which is severe at times. His GI review of systems is otherwise negative.  REVIEW OF SYSTEMS:  All non-GI ROS negative unless otherwise stated in the history of present illness except for cough  Past Medical History:  Diagnosis Date  . Bell's palsy    L sided with mild persistent lip droop  . BPH (benign prostatic hyperplasia) 07/1997  . Colon polyps    adenomatous  . Diabetes mellitus type II 01/2003  . Diverticulosis   . GERD (gastroesophageal reflux disease) 11/2002  . Heart murmur    history of  . Hyperlipemia 04/1989  . Internal hemorrhoids   . Peptic stricture of esophagus   . Sleep apnea    sleep study pos on CPAP  . Spermatocele     Past Surgical History:  Procedure Laterality Date  . CHOLECYSTECTOMY  01/05/2004  . COLONOSCOPY    . CYSTECTOMY Right 1990   ganglion wrist  . DOPPLER ECHOCARDIOGRAPHY  01/13/2004   normal  . hematuria  12/1994   w/u neg  (urol)  . VASECTOMY  05/26/2006    Social History Edward Wright  reports that he quit smoking about 3 years ago. His smoking use included cigarettes. He has a 10.00 pack-year smoking history. He has never used smokeless tobacco. He reports that he drinks alcohol. He reports that he does not use drugs.  family history includes Cancer in his father and other; Dementia in his mother; Heart disease in his mother; Hypertension in his mother; Transient ischemic attack in his mother.  Allergies  Allergen Reactions  . Flexeril [Cyclobenzaprine] Other (See Comments)    Sedation caution.   . Hydrocodone Nausea Only    Sweating       PHYSICAL EXAMINATION: Vital signs: BP 118/70   Pulse 76   Ht 6' (1.829 m)   Wt 250 lb (113.4 kg)   BMI 33.91 kg/m   Constitutional: generally well-appearing, no acute distress Psychiatric: alert and oriented x3, cooperative Eyes: extraocular movements intact, anicteric, conjunctiva pink Mouth: oral pharynx moist, no lesions Neck: supple no lymphadenopathy Cardiovascular: heart regular rate and rhythm, soft systolic ejection murmur Lungs: clear to auscultation bilaterally Abdomen: soft, nontender, nondistended, no obvious ascites, no peritoneal signs, normal bowel sounds, no organomegaly Rectal:deferred until colonoscopy Extremities: no clubbing, cyanosis, or lower extremity edema bilaterally Skin: no lesions on visible extremities Neuro: No focal deficits. Cranial nerves intact  ASSESSMENT:  #1. Dysphagia. Secondary to known esophageal stricture #2. Ectopic gastric mucosa in the proximal esophagus at the area of stricturing #3. Multiple adenomatous colon polyps and sessile serrated polyp. Overdue for surveillance #  4. Multiple general medical problems including diabetes mellitus   PLAN:  #1. Prescribe omeprazole 40 mg daily. The importance of acid suppression reviewed #2. Schedule upper endoscopy with esophageal dilation.The nature of the  procedure, as well as the risks, benefits, and alternatives were carefully and thoroughly reviewed with the patient. Ample time for discussion and questions allowed. The patient understood, was satisfied, and agreed to proceed. #3. Schedule surveillance colonoscopy.The nature of the procedure, as well as the risks, benefits, and alternatives were carefully and thoroughly reviewed with the patient. Ample time for discussion and questions allowed. The patient understood, was satisfied, and agreed to proceed. #4. Hold diabetic medications the day of the procedure to avoid hypoglycemia

## 2018-01-19 NOTE — Patient Instructions (Signed)
You have been scheduled for an endoscopy and colonoscopy. Please follow the written instructions given to you at your visit today. Please pick up your prep supplies at the pharmacy within the next 1-3 days. If you use inhalers (even only as needed), please bring them with you on the day of your procedure. Your physician has requested that you go to www.startemmi.com and enter the access code given to you at your visit today. This web site gives a general overview about your procedure. However, you should still follow specific instructions given to you by our office regarding your preparation for the procedure.  We have sent the following medications to your pharmacy for you to pick up at your convenience: Omeprazole 40 mg daily  If you are age 92 or older, your body mass index should be between 23-30. Your Body mass index is 33.91 kg/m. If this is out of the aforementioned range listed, please consider follow up with your Primary Care Provider.  If you are age 70 or younger, your body mass index should be between 19-25. Your Body mass index is 33.91 kg/m. If this is out of the aformentioned range listed, please consider follow up with your Primary Care Provider.

## 2018-02-09 ENCOUNTER — Other Ambulatory Visit: Payer: Self-pay | Admitting: Family Medicine

## 2018-03-14 ENCOUNTER — Encounter: Payer: Self-pay | Admitting: Family Medicine

## 2018-03-14 ENCOUNTER — Ambulatory Visit (INDEPENDENT_AMBULATORY_CARE_PROVIDER_SITE_OTHER): Payer: 59 | Admitting: Family Medicine

## 2018-03-14 ENCOUNTER — Other Ambulatory Visit: Payer: Self-pay | Admitting: Family Medicine

## 2018-03-14 DIAGNOSIS — L989 Disorder of the skin and subcutaneous tissue, unspecified: Secondary | ICD-10-CM

## 2018-03-14 MED ORDER — CLOTRIMAZOLE 1 % EX CREA: TOPICAL_CREAM | CUTANEOUS | Status: AC

## 2018-03-14 MED ORDER — BASAGLAR KWIKPEN 100 UNIT/ML ~~LOC~~ SOPN: PEN_INJECTOR | SUBCUTANEOUS | 12 refills | Status: DC

## 2018-03-14 MED ORDER — HYDROCORTISONE ACETATE 25 MG RE SUPP: RECTAL | 2 refills | Status: DC

## 2018-03-14 NOTE — Patient Instructions (Addendum)
I think you had a benign spot that is currently healing.  If it heals over, then no treatment needed.  If not healing or worse, then need to get rechecked.  No charge for the visit.  Take care.  Glad to see you.

## 2018-03-14 NOTE — Progress Notes (Signed)
Spot on back.  Unclear if he had a bug bite.  He scratched it, it was prev raised.  He wanted it checked.  It scabbed over in the meantime.  Meds, vitals, and allergies reviewed.   ROS: Per HPI unless specifically indicated in ROS section   nad ncat 3x64mm irritated area on the upper back on the R.  It has some central scabbing.  It does not have any spreading erythema.  No fluctuant mass.  The lesion is small and appears to be healing.

## 2018-03-15 NOTE — Assessment & Plan Note (Signed)
Whatever the lesion was initially, it was nearly obliterated by him scratching it.  It is very small.  Does not appear infected.  Appears to be healing normally.  Discussed options.  If this heals over and he does not have any other issues then he should not need follow-up.  If it does not completely heal over then we can always recheck it.  Observation is likely better than trying to intervene in any other way today.  Discussed.  He agrees.  No charge for visit.

## 2018-03-22 ENCOUNTER — Encounter: Payer: 59 | Admitting: Internal Medicine

## 2018-03-23 ENCOUNTER — Encounter: Payer: Self-pay | Admitting: Internal Medicine

## 2018-03-29 ENCOUNTER — Encounter: Payer: Self-pay | Admitting: Internal Medicine

## 2018-03-29 ENCOUNTER — Ambulatory Visit: Payer: 59 | Admitting: Internal Medicine

## 2018-03-29 VITALS — BP 130/80 | HR 101 | Ht 72.0 in | Wt 250.8 lb

## 2018-03-29 DIAGNOSIS — F5101 Primary insomnia: Secondary | ICD-10-CM

## 2018-03-29 DIAGNOSIS — G4733 Obstructive sleep apnea (adult) (pediatric): Secondary | ICD-10-CM | POA: Diagnosis not present

## 2018-03-29 MED ORDER — ZALEPLON 5 MG PO CAPS: 5.0000 mg | ORAL_CAPSULE | Freq: Every evening | ORAL | 5 refills | Status: DC | PRN

## 2018-03-29 NOTE — Progress Notes (Signed)
HPI-  male former smoker followed for obstructive sleep apnea/CPAP, complicated by DM 2, muscle cramps NPSG was prior to 2009- not in EMR  -------------------------------------------------------------------------- 06/27/2016-57 year old male former smoker followed for OSA/CPAP, complicated by DM 2, muscle cramps CPAP 14/Advanced FOLLOWS FOR: DME [facility]. Pt wears CPAP nightly and no new supplies needed at this time. No DL and not in AirView.  He will bring download chip later. Since he left CPAP still at 13 after last visit and is comfortable with it now. Sleeps well with no snoring reported.  03/29/2018- 57 year old male former smoker followed for OSA/CPAP, complicated by DM 2, muscle cramps CPAP 14/Advanced -----Follows for: OSA, doing well on CPAP, denies any issues Some difficulty initiating and maintaining sleep-"busy brain". Very comfortable with his CPAP at 14.  Download compliance 100% AHI 3.5/hour.  We discussed option to try AutoPap but he chooses not to change.  ROS- see HPI   + sign = positive Constitutional:   No-   weight loss, night sweats, fevers, chills,  fatigue, lassitude. HEENT:   No-  headaches, difficulty swallowing, tooth/dental problems, sore throat,       No-  sneezing, itching, ear ache, nasal congestion, post nasal drip,  CV:  No-   chest pain, orthopnea, PND, swelling in lower extremities, anasarca, dizziness, palpitations Resp: No-   shortness of breath with exertion or at rest.              No-   productive cough,  No non-productive cough,  No-  coughing up of blood.              No-   change in color of mucus.  No- wheezing.   Skin: No-   rash or lesions. GI:  No-   heartburn, indigestion, abdominal pain, nausea, vomiting,  GU:  MS:  No-   joint pain or swelling.  . Neuro- nothing unusual  Psych:  No- change in mood or affect. No depression or anxiety.  No memory loss.  OBJ- General- Alert, Oriented, Affect-appropriate, calm, Distress- none acute,                     + overweight Skin- rash-none, lesions- none, excoriation- none Lymphadenopathy- none Head- atraumatic            Eyes- Gross vision intact, PERRLA, conjunctivae clear secretions            Ears- Hearing, canals-normal            Nose- Clear, no-Septal dev, mucus, polyps, erosion, perforation             Throat- Mallampati III , mucosa clear , drainage- none, tonsils- atrophic Neck- flexible , trachea midline, no stridor , thyroid nl, carotid no bruit Chest - symmetrical excursion , unlabored           Heart/CV- RRR , no murmur , no gallop  , no rub, nl s1 s2                           - JVD- none , edema- none, stasis changes- none, varices- none           Lung- clear to P&A, wheeze- none, cough- none , dullness-none, rub- none           Chest wall-  Abd-  Br/ Gen/ Rectal- Not done, not indicated Extrem- cyanosis- none, clubbing, none, atrophy- none, strength- nl Neuro- grossly intact to observation

## 2018-03-29 NOTE — Patient Instructions (Signed)
We can continue CPAP 14, mask of choice, humidifier, supplies, Airview  Please call if we can help

## 2018-03-31 ENCOUNTER — Encounter: Payer: Self-pay | Admitting: Internal Medicine

## 2018-03-31 NOTE — Assessment & Plan Note (Signed)
Difficulty initiating and maintaining sleep at times.  He agrees to the term "busy brain"-just cannot settle down.  Sleep hygiene and management options discussed. Plan - try Sonata as discussed, for short half-life

## 2018-03-31 NOTE — Assessment & Plan Note (Addendum)
He continues to benefit from CPAP with improved sleep.  Download confirms excellent compliance and control. Plan-continue CPAP 14

## 2018-04-06 ENCOUNTER — Encounter: Payer: Self-pay | Admitting: Internal Medicine

## 2018-04-06 ENCOUNTER — Ambulatory Visit (AMBULATORY_SURGERY_CENTER): Payer: 59 | Admitting: Internal Medicine

## 2018-04-06 VITALS — BP 114/64 | HR 78 | Temp 98.9°F | Resp 14 | Ht 72.0 in | Wt 250.0 lb

## 2018-04-06 DIAGNOSIS — Z86010 Personal history of colonic polyps: Secondary | ICD-10-CM

## 2018-04-06 DIAGNOSIS — D122 Benign neoplasm of ascending colon: Secondary | ICD-10-CM | POA: Diagnosis not present

## 2018-04-06 DIAGNOSIS — K219 Gastro-esophageal reflux disease without esophagitis: Secondary | ICD-10-CM

## 2018-04-06 DIAGNOSIS — D124 Benign neoplasm of descending colon: Secondary | ICD-10-CM

## 2018-04-06 DIAGNOSIS — D125 Benign neoplasm of sigmoid colon: Secondary | ICD-10-CM | POA: Diagnosis not present

## 2018-04-06 DIAGNOSIS — K222 Esophageal obstruction: Secondary | ICD-10-CM | POA: Diagnosis not present

## 2018-04-06 DIAGNOSIS — K635 Polyp of colon: Secondary | ICD-10-CM

## 2018-04-06 MED ORDER — SODIUM CHLORIDE 0.9 % IV SOLN: 500.0000 mL | INTRAVENOUS | Status: DC

## 2018-04-06 NOTE — Op Note (Signed)
[facility] Patient Name: Clemens Lachman Procedure Date: 04/06/2018 1:01 PM MRN: [id number] Endoscopist: Docia Chuck. Henrene Pastor , MD Age: 57 Referring MD:  Date of Birth: March 14, 1961 Gender: Male Account #: [id number] Procedure:                Colonoscopy cold snare polypectomy x 4. Indications:              High risk colon cancer surveillance: Personal                            history of multiple (3 or more) adenomas, High risk                            colon cancer surveillance: Personal history of                            sessile serrated colon polyp (less than 10 mm in                            size) with no dysplasia previous examination                            October 2015 Medicines:                Monitored Anesthesia Care Procedure:                Pre-Anesthesia Assessment:                           - Prior to the procedure, a History and Physical                            was performed, and patient medications and                            allergies were reviewed. The patient's tolerance of                            previous anesthesia was also reviewed. The risks                            and benefits of the procedure and the sedation                            options and risks were discussed with the patient.                            All questions were answered, and informed consent                            was obtained. Prior Anticoagulants: The patient has                            taken no previous anticoagulant or antiplatelet  agents. ASA Grade Assessment: II - A patient with                            mild systemic disease. After reviewing the risks                            and benefits, the patient was deemed in                            satisfactory condition to undergo the procedure.                           After obtaining informed consent, the colonoscope                            was passed under direct vision.  Throughout the                            procedure, the patient's blood pressure, pulse, and                            oxygen saturations were monitored continuously. The                            Colonoscope was introduced through the anus and                            advanced to the the cecum, identified by                            appendiceal orifice and ileocecal valve. The                            ileocecal valve, appendiceal orifice, and rectum                            were photographed. The quality of the bowel                            preparation was good. The colonoscopy was performed                            without difficulty. The patient tolerated the                            procedure well. The bowel preparation used was                            SUPREP. Scope In: 1:17:31 PM Scope Out: 1:33:04 PM Scope Withdrawal Time: 0 hours 13 minutes 2 seconds  Total Procedure Duration: 0 hours 15 minutes 33 seconds  Findings:                 Four polyps were found in the sigmoid colon,  descending colon and ascending colon. The polyps                            were 3 to 5 mm in size.                           Multiple diverticula were found in the sigmoid                            colon.                           Internal hemorrhoids were found during retroflexion.                           The exam was otherwise without abnormality on                            direct and retroflexion views. Complications:            No immediate complications. Estimated blood loss:                            None. Estimated Blood Loss:     Estimated blood loss: none. Impression:               - Four 3 to 5 mm polyps in the sigmoid colon, in                            the descending colon and in the ascending colon.                           - Diverticulosis in the sigmoid colon.                           - Internal hemorrhoids.                            - The examination was otherwise normal on direct                            and retroflexion views.                           - No specimens collected. Recommendation:           - Repeat colonoscopy in 3 or 5 years for                            surveillance based on final pathology results.                           - Patient has a contact number available for                            emergencies. The signs and symptoms of potential  delayed complications were discussed with the                            patient. Return to normal activities tomorrow.                            Written discharge instructions were provided to the                            patient.                           - Resume previous diet.                           - Continue present medications.                           - Await pathology results. Docia Chuck. Henrene Pastor, MD 04/06/2018 1:37:53 PM This report has been signed electronically.

## 2018-04-06 NOTE — Patient Instructions (Signed)
*handout on polyps, hemorrhoids, diverticulosis, GERD, dilation diet,  And stricture   YOU HAD AN ENDOSCOPIC PROCEDURE TODAY AT [facility]:   Refer to the procedure report that was given to you for any specific questions about what was found during the examination.  If the procedure report does not answer your questions, please call your gastroenterologist to clarify.  If you requested that your care partner not be given the details of your procedure findings, then the procedure report has been included in a sealed envelope for you to review at your convenience later.  YOU SHOULD EXPECT: Some feelings of bloating in the abdomen. Passage of more gas than usual.  Walking can help get rid of the air that was put into your GI tract during the procedure and reduce the bloating. If you had a lower endoscopy (such as a colonoscopy or flexible sigmoidoscopy) you may notice spotting of blood in your stool or on the toilet paper. If you underwent a bowel prep for your procedure, you may not have a normal bowel movement for a few days.  Please Note:  You might notice some irritation and congestion in your nose or some drainage.  This is from the oxygen used during your procedure.  There is no need for concern and it should clear up in a day or so.  SYMPTOMS TO REPORT IMMEDIATELY:   Following lower endoscopy (colonoscopy or flexible sigmoidoscopy):  Excessive amounts of blood in the stool  Significant tenderness or worsening of abdominal pains  Swelling of the abdomen that is new, acute  Fever of 100F or higher   Following upper endoscopy (EGD)  Vomiting of blood or coffee ground material  New chest pain or pain under the shoulder blades  Painful or persistently difficult swallowing  New shortness of breath  Fever of 100F or higher  Black, tarry-looking stools  For urgent or emergent issues, a gastroenterologist can be reached at any hour by calling [phone].   DIET:   Nothing by mouth until 2:40, clear liquids until 3:40, soft foods for the rest of the day. Resume normal diet tomorrow.  You should avoid alcoholic beverages for 24 hours.  ACTIVITY:  You should plan to take it easy for the rest of today and you should NOT DRIVE or use heavy machinery until tomorrow (because of the sedation medicines used during the test).    FOLLOW UP: Our staff will call the number listed on your records the next business day following your procedure to check on you and address any questions or concerns that you may have regarding the information given to you following your procedure. If we do not reach you, we will leave a message.  However, if you are feeling well and you are not experiencing any problems, there is no need to return our call.  We will assume that you have returned to your regular daily activities without incident.  If any biopsies were taken you will be contacted by phone or by letter within the next 1-3 weeks.  Please call us at [phone] if you have not heard about the biopsies in 3 weeks.    SIGNATURES/CONFIDENTIALITY: You and/or your care partner have signed paperwork which will be entered into your electronic medical record.  These signatures attest to the fact that that the information above on your After Visit Summary has been reviewed and is understood.  Full responsibility of the confidentiality of this discharge information lies with you and/or your care-partner.

## 2018-04-06 NOTE — Op Note (Addendum)
[facility] Patient Name: Edward Wright Procedure Date: 04/06/2018 1:00 PM MRN: [id number] Endoscopist: Docia Chuck. Henrene Pastor , MD Age: 57 Referring MD:  Date of Birth: 1961-05-29 Gender: Male Account #: [id number] Procedure:                Upper GI endoscopy with Venia Minks dilation of the                            esophagus-52 [place] Indications:              Dysphagia, Esophageal reflux Medicines:                Monitored Anesthesia Care Procedure:                Pre-Anesthesia Assessment:                           - Prior to the procedure, a History and Physical                            was performed, and patient medications and                            allergies were reviewed. The patient's tolerance of                            previous anesthesia was also reviewed. The risks                            and benefits of the procedure and the sedation                            options and risks were discussed with the patient.                            All questions were answered, and informed consent                            was obtained. Prior Anticoagulants: The patient has                            taken no previous anticoagulant or antiplatelet                            agents. ASA Grade Assessment: II - A patient with                            mild systemic disease. After reviewing the risks                            and benefits, the patient was deemed in                            satisfactory condition to undergo the procedure.  After obtaining informed consent, the endoscope was                            passed under direct vision. Throughout the                            procedure, the patient's blood pressure, pulse, and                            oxygen saturations were monitored continuously. The                            Endoscope was introduced through the mouth, and                            advanced to the second part  of duodenum. The upper                            GI endoscopy was accomplished without difficulty.                            The patient tolerated the procedure well. Scope In: Scope Out: Findings:                 One benign-appearing, intrinsic moderate stenosis                            was found 40 cm from the incisors. This stenosis                            measured 1.5 cm (inner diameter). The scope was                            withdrawn. Dilation was performed with a Maloney                            dilator with no resistance at 52 Fr. No heme.                            Tolerated well.                           The exam of the esophagus was otherwise normal.                           The stomach was normal.                           The examined duodenum was normal.                           The cardia and gastric fundus were normal on                            retroflexion. Complications:  No immediate complications. Estimated Blood Loss:     Estimated blood loss: none. Impression:               - Benign-appearing esophageal stenosis. Dilated.                           - Normal stomach.                           - Normal examined duodenum.                           - No specimens collected. Recommendation:           - Patient has a contact number available for                            emergencies. The signs and symptoms of potential                            delayed complications were discussed with the                            patient. Return to normal activities tomorrow.                            Written discharge instructions were provided to the                            patient.                           - Post dilation diet.                           - Continue present medications including omeprazole                            40 mg daily indefinitely                           - Reflux precautions with attention to weight loss                            - Stop smoking.                           - Return to the care of your primary provider. GI                            follow-up as needed Docia Chuck. Henrene Pastor, MD 04/06/2018 1:46:30 PM This report has been signed electronically.

## 2018-04-06 NOTE — Progress Notes (Signed)
Called to room to assist during endoscopic procedure.  Patient ID and intended procedure confirmed with present staff. Received instructions for my participation in the procedure from the performing physician.  

## 2018-04-06 NOTE — Progress Notes (Signed)
Report to PACU, RN, vss, BBS= Clear.  

## 2018-04-09 ENCOUNTER — Telehealth: Payer: Self-pay | Admitting: *Deleted

## 2018-04-09 NOTE — Telephone Encounter (Signed)
  Follow up Call-  Call back number 04/06/2018  Post procedure Call Back phone  # [phone]  Permission to leave phone message Yes  Some recent data might be hidden     Patient questions:  Do you have a fever, pain , or abdominal swelling? No. Pain Score  0 *  Have you tolerated food without any problems? Yes.    Have you been able to return to your normal activities? Yes.    Do you have any questions about your discharge instructions: Diet   No. Medications  No. Follow up visit  No.  Do you have questions or concerns about your Care? No.  Actions: * If pain score is 4 or above: No action needed, pain <4.

## 2018-04-10 ENCOUNTER — Encounter: Payer: Self-pay | Admitting: Internal Medicine

## 2018-07-20 ENCOUNTER — Encounter: Payer: Self-pay | Admitting: Family Medicine

## 2018-07-20 ENCOUNTER — Ambulatory Visit: Payer: 59 | Admitting: Family Medicine

## 2018-07-20 VITALS — BP 122/68 | HR 90 | Temp 98.8°F | Ht 72.0 in

## 2018-07-20 DIAGNOSIS — E119 Type 2 diabetes mellitus without complications: Secondary | ICD-10-CM

## 2018-07-20 DIAGNOSIS — L989 Disorder of the skin and subcutaneous tissue, unspecified: Secondary | ICD-10-CM | POA: Diagnosis not present

## 2018-07-20 LAB — POCT GLYCOSYLATED HEMOGLOBIN (HGB A1C): Hemoglobin A1C: 6.3 % — AB (ref 4.0–5.6)

## 2018-07-20 MED ORDER — INSULIN PEN NEEDLE 31G X 5 MM MISC: 3 refills | Status: DC

## 2018-07-20 NOTE — Patient Instructions (Addendum)
Try to cut back by 1 unit per day and see how low you can get your total insulin dose.  Goal is to avoid any lower sugars and keep your AM sugar below 150, below 140 if possible.   Thanks for your effort.   Recheck with labs prior to a physical in July.   Call about an eye exam.  Take care.  Glad to see you.

## 2018-07-20 NOTE — Progress Notes (Signed)
He had some resolving URI sx, better in the meantime.  No fevers.  Diabetes:  Using medications without difficulties: usually 70 units per day.  Hypoglycemic episodes: rare, cautions d/w pt.  Lowest down to 65.   Hyperglycemic episodes: no Feet problems: no Blood Sugars averaging: ~100-130 eye exam within last year: yes A1c lower.   D/w pt.   He cut back on carbs.  Exercise encouraged.    He has seen Dr. Annamaria Boots re: zaleplon.  He is only using if he has to. Still using CPAP.    Meds, vitals, and allergies reviewed.   ROS: Per HPI unless specifically indicated in ROS section   GEN: nad, alert and oriented HEENT: mucous membranes moist NECK: supple w/o LA CV: rrr. PULM: ctab, no inc wob ABD: soft, +bs EXT: no edema SKIN: R upper back with 3x4 mm minimally flaky benign appearing uniform light brown macule w/o redness or irritation.  No alarming features noted.

## 2018-07-22 NOTE — Assessment & Plan Note (Signed)
R upper back with 3x4 mm minimally flaky benign appearing uniform light brown macule w/o redness or irritation.  No alarming features noted.  Reasonable to observe for now.  He agrees.

## 2018-07-22 NOTE — Assessment & Plan Note (Addendum)
A1c lower.   D/w pt.   He cut back on carbs.  Recheck in about 6 months.  Continue work on diet and exercise.  He agrees. He can try to cut back by 1 unit per day and see how low he can get his total insulin dose.  Goal is to avoid any lower sugars and keep AM sugar below 150, below 140 if possible.  Discussed.  He agrees.

## 2018-09-12 ENCOUNTER — Telehealth (INDEPENDENT_AMBULATORY_CARE_PROVIDER_SITE_OTHER): Payer: 59 | Admitting: Family Medicine

## 2018-09-12 ENCOUNTER — Other Ambulatory Visit: Payer: Self-pay

## 2018-09-12 DIAGNOSIS — J028 Acute pharyngitis due to other specified organisms: Secondary | ICD-10-CM | POA: Diagnosis not present

## 2018-09-12 DIAGNOSIS — B9789 Other viral agents as the cause of diseases classified elsewhere: Secondary | ICD-10-CM

## 2018-09-12 NOTE — Patient Instructions (Signed)
Based on your symptoms, it looks like you have a virus.   Antibiotics are not need for a viral infection but the following will help:   1. Drink plenty of fluids 2. Get lots of rest  Sore Throat 1) Honey as above, cough drops 2) Ibuprofen or Aleve can be helpful 3) Salt water Gargles 4) Can also try allergy medication (Like claritin or zyrtec)  If you develop fevers (Temperature >100.4), chills, worsening symptoms or symptoms lasting longer than 10 days return to clinic.

## 2018-09-12 NOTE — Progress Notes (Signed)
Virtual Visit via Telephone Note  I connected with Edward Wright on 09/12/18 at  9:20 AM EDT by telephone and verified that I am speaking with the correct person using two identifiers.   I discussed the limitations, risks, security and privacy concerns of performing an evaluation and management service by telephone and the availability of in person appointments. I also discussed with the patient that there may be a patient responsible charge related to this service. The patient expressed understanding and agreed to proceed.  Patient location: Home Provider Location: [place] Participants: [name] and Edward Wright, and wife Angela Nevin   History of Present Illness: #Sore throat - started yesterday afternoon - at 3:30 pm - noticed white patches/tonsil stones in the back in the mouth - endorses: sore throat, mild cough,  - denies: runny nose, post nasal drip, fever - Sick contact: no known - Treatment: salt water gargles, emergency - C, zinc - can swallow - just a little sore - feels like it may be getting slightly worse    Observations/Objective: T 96.1  Assessment and Plan: Problem List Items Addressed This Visit    None    Visit Diagnoses    Viral sore throat    -  Primary     Symptomatic care Call back if fevers   Follow Up Instructions: If worsening, fevers, or not improved   I discussed the assessment and treatment plan with the patient. The patient was provided an opportunity to ask questions and all were answered. The patient agreed with the plan and demonstrated an understanding of the instructions.   The patient was advised to call back or seek an in-person evaluation if the symptoms worsen or if the condition fails to improve as anticipated.  I provided 10 minutes of non-face-to-face time during this encounter.   [name], MD

## 2018-12-30 ENCOUNTER — Other Ambulatory Visit: Payer: Self-pay | Admitting: Family Medicine

## 2018-12-30 DIAGNOSIS — E119 Type 2 diabetes mellitus without complications: Secondary | ICD-10-CM

## 2019-01-02 ENCOUNTER — Other Ambulatory Visit: Payer: Self-pay

## 2019-01-02 ENCOUNTER — Telehealth: Payer: Self-pay

## 2019-01-02 ENCOUNTER — Other Ambulatory Visit (INDEPENDENT_AMBULATORY_CARE_PROVIDER_SITE_OTHER): Payer: 59

## 2019-01-02 DIAGNOSIS — E119 Type 2 diabetes mellitus without complications: Secondary | ICD-10-CM | POA: Diagnosis not present

## 2019-01-02 LAB — COMPREHENSIVE METABOLIC PANEL WITH GFR
ALT: 29 U/L (ref 0–53)
AST: 21 U/L (ref 0–37)
Albumin: 4.6 g/dL (ref 3.5–5.2)
Alkaline Phosphatase: 50 U/L (ref 39–117)
BUN: 16 mg/dL (ref 6–23)
CO2: 29 meq/L (ref 19–32)
Calcium: 9.5 mg/dL (ref 8.4–10.5)
Chloride: 106 meq/L (ref 96–112)
Creatinine, Ser: 1.07 mg/dL (ref 0.40–1.50)
GFR: 70.51 mL/min
Glucose, Bld: 41 mg/dL — CL (ref 70–99)
Potassium: 3.6 meq/L (ref 3.5–5.1)
Sodium: 144 meq/L (ref 135–145)
Total Bilirubin: 0.4 mg/dL (ref 0.2–1.2)
Total Protein: 7 g/dL (ref 6.0–8.3)

## 2019-01-02 LAB — LIPID PANEL
Cholesterol: 144 mg/dL (ref 0–200)
HDL: 27.8 mg/dL — ABNORMAL LOW (ref >39.00)
LDL Cholesterol: 99 mg/dL (ref 0–99)
NonHDL: 115.94
Total CHOL/HDL Ratio: 5
Triglycerides: 83 mg/dL (ref 0.0–149.0)
VLDL: 16.6 mg/dL (ref 0.0–40.0)

## 2019-01-02 LAB — HEMOGLOBIN A1C: Hgb A1c MFr Bld: 6.8 % — ABNORMAL HIGH (ref 4.6–6.5)

## 2019-01-02 LAB — MICROALBUMIN / CREATININE URINE RATIO
Creatinine,U: 140.5 mg/dL
Microalb Creat Ratio: 1.7 mg/g (ref 0.0–30.0)
Microalb, Ur: 2.3 mg/dL — ABNORMAL HIGH (ref 0.0–1.9)

## 2019-01-02 NOTE — Telephone Encounter (Signed)
Noted! Thank you

## 2019-01-02 NOTE — Telephone Encounter (Addendum)
Noted.  Many thanks.  I appreciate the help of all involved. In the meantime please have patient cut back by 5 units on his insulin dose per day. I will see him at the office visit as scheduled.

## 2019-01-02 NOTE — Telephone Encounter (Signed)
Pt last ate at 9pm on 01/01/2019, drank something around 1030/1100pm 01/01/2019.  Pt stated he could tell he was low as he was a little sweaty and shaky.  Pt had a soda after labs to get blood sugar up.  He said he has ate since then and feels fine.  Had not tested his blood sugar yet today but will test soon and will reach out to [place] if it is still low.

## 2019-01-02 NOTE — Telephone Encounter (Signed)
plz call patient ensure doing well and has eaten since labs were drawn. cbg too low at 41. How long did he fast prior to labs?

## 2019-01-02 NOTE — Telephone Encounter (Signed)
Spoke with pt relaying instructions per Dr. Damita Dunnings.  Pt verbalizes understanding.

## 2019-01-02 NOTE — Telephone Encounter (Signed)
[facility] called to report a critical result @ 1320 Glucose of 41

## 2019-01-04 ENCOUNTER — Other Ambulatory Visit: Payer: Self-pay

## 2019-01-04 ENCOUNTER — Ambulatory Visit (INDEPENDENT_AMBULATORY_CARE_PROVIDER_SITE_OTHER): Payer: 59 | Admitting: Family Medicine

## 2019-01-04 ENCOUNTER — Encounter: Payer: Self-pay | Admitting: Family Medicine

## 2019-01-04 VITALS — BP 126/70 | HR 84 | Temp 97.6°F | Ht 72.0 in | Wt 248.1 lb

## 2019-01-04 DIAGNOSIS — E785 Hyperlipidemia, unspecified: Secondary | ICD-10-CM

## 2019-01-04 DIAGNOSIS — E119 Type 2 diabetes mellitus without complications: Secondary | ICD-10-CM

## 2019-01-04 DIAGNOSIS — Z7189 Other specified counseling: Secondary | ICD-10-CM

## 2019-01-04 DIAGNOSIS — Z0000 Encounter for general adult medical examination without abnormal findings: Secondary | ICD-10-CM

## 2019-01-04 DIAGNOSIS — M549 Dorsalgia, unspecified: Secondary | ICD-10-CM

## 2019-01-04 DIAGNOSIS — Z125 Encounter for screening for malignant neoplasm of prostate: Secondary | ICD-10-CM

## 2019-01-04 DIAGNOSIS — L509 Urticaria, unspecified: Secondary | ICD-10-CM

## 2019-01-04 DIAGNOSIS — Z87891 Personal history of nicotine dependence: Secondary | ICD-10-CM

## 2019-01-04 MED ORDER — TRAMADOL HCL 50 MG PO TABS: 50.0000 mg | ORAL_TABLET | Freq: Three times a day (TID) | ORAL | 1 refills | Status: DC | PRN

## 2019-01-04 MED ORDER — METFORMIN HCL 500 MG PO TABS: ORAL_TABLET | ORAL | 11 refills | Status: DC

## 2019-01-04 MED ORDER — LISINOPRIL 2.5 MG PO TABS: 2.5000 mg | ORAL_TABLET | Freq: Every day | ORAL | 11 refills | Status: DC

## 2019-01-04 MED ORDER — OMEPRAZOLE 40 MG PO CPDR: 40.0000 mg | DELAYED_RELEASE_CAPSULE | Freq: Every day | ORAL | 11 refills | Status: DC

## 2019-01-04 MED ORDER — SAXAGLIPTIN HCL 5 MG PO TABS: ORAL_TABLET | ORAL | 11 refills | Status: DC

## 2019-01-04 MED ORDER — BASAGLAR KWIKPEN 100 UNIT/ML ~~LOC~~ SOPN: PEN_INJECTOR | SUBCUTANEOUS | Status: DC

## 2019-01-04 MED ORDER — DIAZEPAM 5 MG PO TABS: 5.0000 mg | ORAL_TABLET | Freq: Three times a day (TID) | ORAL | 1 refills | Status: DC | PRN

## 2019-01-04 NOTE — Patient Instructions (Addendum)
Check with your insurance to see if they will cover the shingrix shot. Cut back on the insulin and update me as needed.  Recheck in about 3 months, labs ahead of time.  Start lisinopril.  Take care.  Glad to see you.

## 2019-01-04 NOTE — Progress Notes (Signed)
CPE- See plan.  Routine anticipatory guidance given to patient.  See health maintenance.  The possibility exists that previously documented standard health maintenance information may have been brought forward from a previous encounter into this note.  If needed, that same information has been updated to reflect the current situation based on today's encounter.    Tetanus 2011 Flu done yearly PNA 2014 Shingles not due yet Colon cancer screening- colonoscopy 2019 Prostate cancer screening and PSA options (with potential risks and benefits of testing vs not testing) were discussed along with recent recs/guidelines.  He opted for testing PSA with next set of labs.  Living will d/w pt. Would have his wife designated if patient were incapacitated.  Diet and exercise d/w pt. Working on both.   30 pack year hx, he opted for lung CA screening program.  Referred.  Diabetes:  Using medications without difficulties: Hypoglycemic episodes: yes, see prev phone notes.  Hyperglycemic episodes: no, max 175 in the AM Feet problems:no Blood Sugars averaging: usually 120-140s in the AMs.   eye exam within last year: due.   MALB positive.  ACE cautions d/w pt.  No h/o tongue swelling.  He cut back to 55 units a day, down from 60 units a day.  D/w pt about taper.    He had an episode of hives after eating pistachios with seasoning.  He didn't know if the pistachios or the seasoning was the issue.  Cautions d/w pt.  He doesn't have tree nut allergies o/w, has tolerated pecan and peanuts in the meantime.   Elevated Cholesterol: Using medications without problems:yes Muscle aches: no Diet compliance:yes Exercise:yes  He had used tramadol and valium for upper back pain, prn with a flare.  No ADE on med.   PMH and SH reviewed  Meds, vitals, and allergies reviewed.   ROS: Per HPI.  Unless specifically indicated otherwise in HPI, the patient denies:  General: fever. Eyes: acute vision changes ENT:  sore throat Cardiovascular: chest pain Respiratory: SOB GI: vomiting GU: dysuria Musculoskeletal: acute back pain Derm: acute rash Neuro: acute motor dysfunction Psych: worsening mood Endocrine: polydipsia Heme: bleeding Allergy: hayfever  GEN: nad, alert and oriented HEENT: mucous membranes moist NECK: supple w/o LA CV: rrr. PULM: ctab, no inc wob ABD: soft, +bs EXT: no edema SKIN: no acute rash  Diabetic foot exam: Normal inspection No skin breakdown except as described below. No calluses  Normal DP pulses Normal sensation to light touch and monofilament Nails normal Small prev scabbed lesion between R 1st and 2nd toes with healing noted.  He had pulled the scab recently.  It is only 1-2 mm across.   Routine foot care discussed with patient.

## 2019-01-06 DIAGNOSIS — L509 Urticaria, unspecified: Secondary | ICD-10-CM | POA: Insufficient documentation

## 2019-01-06 NOTE — Assessment & Plan Note (Signed)
Resolved now.  Routine cautions discussed with patient He had an episode of hives after eating pistachios with seasoning.  He didn't know if the pistachios or the seasoning was the issue.  Cautions d/w pt.  He doesn't have tree nut allergies o/w, has tolerated pecan and peanuts in the meantime.

## 2019-01-06 NOTE — Assessment & Plan Note (Signed)
  He had used tramadol and valium for upper back pain, prn with a flare.  No ADE on med.  Continue as needed use when he has a flare.

## 2019-01-06 NOTE — Assessment & Plan Note (Signed)
Continue pravastatin.  Continue work on diet and exercise.  Discussed with patient.  He agrees.

## 2019-01-06 NOTE — Assessment & Plan Note (Signed)
MALB positive.  ACE cautions d/w pt.  No h/o tongue swelling.  Hypoglycemia risk discussed with patient. He cut back to 55 units a day, down from 60 units a day.  D/w pt about taper.   He will cut back to 50 units and update me as needed.  Recheck periodically.  See after visit summary.  He agrees.

## 2019-01-06 NOTE — Assessment & Plan Note (Signed)
Tetanus 2011 Flu done yearly PNA 2014 Shingles not due yet Colon cancer screening- colonoscopy 2019 Prostate cancer screening and PSA options (with potential risks and benefits of testing vs not testing) were discussed along with recent recs/guidelines.  He opted for testing PSA with next set of labs.  Living will d/w pt. Would have his wife designated if patient were incapacitated.  Diet and exercise d/w pt. Working on both.   30 pack year hx, he opted for lung CA screening program.  Referred.

## 2019-01-06 NOTE — Assessment & Plan Note (Signed)
Living will d/w pt.   Would have his wife designated if patient were incapacitated.  

## 2019-01-27 ENCOUNTER — Other Ambulatory Visit: Payer: Self-pay | Admitting: Family Medicine

## 2019-02-26 ENCOUNTER — Ambulatory Visit: Payer: 59 | Admitting: Family Medicine

## 2019-02-26 ENCOUNTER — Encounter: Payer: Self-pay | Admitting: Family Medicine

## 2019-02-26 ENCOUNTER — Other Ambulatory Visit: Payer: Self-pay

## 2019-02-26 VITALS — BP 130/68 | HR 93 | Temp 97.6°F | Ht 72.0 in | Wt 248.1 lb

## 2019-02-26 DIAGNOSIS — R21 Rash and other nonspecific skin eruption: Secondary | ICD-10-CM

## 2019-02-26 MED ORDER — PREDNISONE 20 MG PO TABS: ORAL_TABLET | ORAL | 0 refills | Status: DC

## 2019-02-26 NOTE — Patient Instructions (Addendum)
I am suspicious for alpha gal allergy to beef after tick bite .  Labs today.  Treat with short prednisone taper after recent .  We will refer you to allergist as well.

## 2019-02-26 NOTE — Progress Notes (Signed)
This visit was conducted in person.  BP 130/68 (BP Location: Left Arm, Patient Position: Sitting, Cuff Size: Large)   Pulse 93   Temp 97.6 F (36.4 C) (Tympanic)   Ht 6' (1.829 m)   Wt 248 lb 2 oz (112.5 kg)   SpO2 98%   BMI 33.65 kg/m    CC: rash Subjective:    Patient ID: Edward Wright, male    DOB: Oct 24, 1960, 58 y.o.   MRN: [id number]  HPI: Edward Wright is a 58 y.o. male presenting on 02/26/2019 for Rash (C/o rash all over.  Areas are very itchy.  Noticed about 6 mos ago.  Says it comes and goes.  Has had several tick bites this year.  Tried Benadryl, helpful. )   6 mo h/o hive-like rash that intermittently develops when he eats certain foods like hamburgers and steak. Last night ate ribs, had rash this morning starting around 4am. Very itchy. Treated with benadryl with benefit (74m today). Most recently noted some tightness in throat "trouble swallowing". Brings pictures as well showing marked urticarial rash throughout body.   Has had several tick bites this year. Never had tick fever or headache, abd pain, nausea, new joint pains after tick bites.  No dyspnea, trouble breathing, tongue/lip swelling.   No new lotions, detergents, soaps. Did start dove shampoo 2 wks ago.  No new meds.  No new foods.      Relevant past medical, surgical, family and social history reviewed and updated as indicated. Interim medical history since our last visit reviewed. Allergies and medications reviewed and updated. Outpatient Medications Prior to Visit  Medication Sig Dispense Refill  . Blood Glucose Monitoring Suppl (ONETOUCH VERIO FLEX SYSTEM) W/DEVICE KIT 1 kit by Other route 2 (two) times daily as needed. Check blood sugar twice daily and as directed.  Insulin Dependent.  Dx:  E11.9 1 kit 0  . clotrimazole (LOTRIMIN) 1 % cream APPLY TOPICALLY 2 (TWO) TIMES DAILY.AS NEEDED    . diazepam (VALIUM) 5 MG tablet Take 1 tablet (5 mg total) by mouth 3 (three) times daily as needed. 30  tablet 1  . hydrocortisone (ANUSOL-HC) 25 MG suppository UNWRAP AND PLACE 1 SUPPOSITORY (25 MG TOTAL) RECTALLY 3 (THREE) TIMES DAILY AS NEEDED. 12 suppository 2  . Insulin Glargine (BASAGLAR KWIKPEN) 100 UNIT/ML SOPN INJECT UP TO 50 UNITS INTO THE SKIN PER DAY    . Insulin Pen Needle (B-D UF III MINI PEN NEEDLES) 31G X 5 MM MISC USE DAILY WITH INSULIN PEN 100 each 3  . lisinopril (ZESTRIL) 2.5 MG tablet Take 1 tablet (2.5 mg total) by mouth daily. 30 tablet 11  . meclizine (ANTIVERT) 25 MG tablet Take 1 tablet (25 mg total) by mouth 3 (three) times daily as needed for dizziness. 90 tablet 1  . metFORMIN (GLUCOPHAGE) 500 MG tablet TAKE 2 TABLETS (1,000 MG TOTAL) BY MOUTH 2 (TWO) TIMES DAILY WITH A MEAL. 120 tablet 11  . NON FORMULARY CPAP 13 Advanced, use as directed     . omeprazole (PRILOSEC) 40 MG capsule Take 1 capsule (40 mg total) by mouth daily. 30 capsule 11  . ONETOUCH VERIO test strip USE TO TEST BLOOD SUGAR 1-2 TIMES DAILY 50 each 7  . pravastatin (PRAVACHOL) 80 MG tablet TAKE 1 TABLET BY MOUTH EVERYDAY AT BEDTIME 90 tablet 2  . saxagliptin HCl (ONGLYZA) 5 MG TABS tablet TAKE 1 TABLET BY MOUTH EVERY DAY 30 tablet 11  . traMADol (ULTRAM) 50 MG tablet Take  1 tablet (50 mg total) by mouth 3 (three) times daily as needed. 30 tablet 1  . zaleplon (SONATA) 5 MG capsule Take 1 capsule (5 mg total) by mouth at bedtime as needed for sleep. 30 capsule 5   No facility-administered medications prior to visit.      Per HPI unless specifically indicated in ROS section below Review of Systems Objective:    BP 130/68 (BP Location: Left Arm, Patient Position: Sitting, Cuff Size: Large)   Pulse 93   Temp 97.6 F (36.4 C) (Tympanic)   Ht 6' (1.829 m)   Wt 248 lb 2 oz (112.5 kg)   SpO2 98%   BMI 33.65 kg/m   Wt Readings from Last 3 Encounters:  02/26/19 248 lb 2 oz (112.5 kg)  01/04/19 248 lb 2 oz (112.5 kg)  04/06/18 250 lb (113.4 kg)    Physical Exam Vitals signs and nursing note  reviewed.  Constitutional:      General: He is not in acute distress.    Appearance: Normal appearance. He is not ill-appearing.  Skin:    General: Skin is warm and dry.     Findings: Erythema and rash present.     Comments: Urticarial rash throughout body predominantly inner thighs, sides of trunk and axilla  Neurological:     Mental Status: He is alert.       Assessment & Plan:   Problem List Items Addressed This Visit    Skin rash - Primary    Describes urticarial rash to mammalian meat products after several tick bites - concern for alpha gal - will check today and refer to allergist. For current rash, will treat with short prednisone taper, discussed continued benadryl and topical anti itch cream. advied avoid mammalian meats at this time. Pt agrees with plan.       Relevant Orders   Ambulatory referral to Allergy   Galactose-alpha-1,3-galactose IgE       Meds ordered this encounter  Medications  . predniSONE (DELTASONE) 20 MG tablet    Sig: Take two tablets daily for 3 days followed by one tablet daily for 3 days    Dispense:  9 tablet    Refill:  0   Orders Placed This Encounter  Procedures  . Galactose-alpha-1,3-galactose IgE  . Ambulatory referral to Allergy    Referral Priority:   Routine    Referral Type:   Allergy Testing    Referral Reason:   Specialty Services Required    Requested Specialty:   Allergy    Number of Visits Requested:   1   Patient Instructions  I am suspicious for alpha gal allergy to beef after tick bite .  Labs today.  Treat with short prednisone taper after recent .  We will refer you to allergist as well.    Follow up plan: No follow-ups on file.  Ria Bush, MD

## 2019-02-27 ENCOUNTER — Encounter: Payer: Self-pay | Admitting: Family Medicine

## 2019-02-27 DIAGNOSIS — R21 Rash and other nonspecific skin eruption: Secondary | ICD-10-CM | POA: Insufficient documentation

## 2019-02-27 NOTE — Assessment & Plan Note (Addendum)
Describes urticarial rash to mammalian meat products after several tick bites - concern for alpha gal - will check today and refer to allergist. For current rash, will treat with short prednisone taper, discussed continued benadryl and topical anti itch cream. advied avoid mammalian meats at this time. Pt agrees with plan.

## 2019-02-28 ENCOUNTER — Telehealth: Payer: Self-pay

## 2019-02-28 DIAGNOSIS — E785 Hyperlipidemia, unspecified: Secondary | ICD-10-CM

## 2019-02-28 NOTE — Telephone Encounter (Signed)
Spoke with patients wife per release form she states that Dr. Rockey Situ offered her husband to have same test so she was calling back so that we could put order in. Order placed and will let them know it is entered for his upcoming appointment.

## 2019-02-28 NOTE — Telephone Encounter (Signed)
Wife is a [facility] patient and is going to have ct calcium test.  She was told Rockey Situ would order for husband - this patient as well.   Please call.

## 2019-03-01 ENCOUNTER — Telehealth: Payer: Self-pay | Admitting: Family Medicine

## 2019-03-01 NOTE — Telephone Encounter (Signed)
Patient called to find out the results of his lab results.  Patient can be contacted through my chart or on phone.

## 2019-03-01 NOTE — Telephone Encounter (Signed)
Pt left v/m requesting lab results on 02/26/19 galactose-alpha test. I spoke with Terri in lab and test is in progress not sure of turn around time for results. FYI to [place] CMA.(Dr G ordered test).

## 2019-03-01 NOTE — Telephone Encounter (Signed)
Noted.  Spoke with pt informing him we have not received results yet but he will be contacted when we do.  Pt verbalizes understanding.

## 2019-03-02 LAB — GALACTOSE-ALPHA-1,3-GALACTOSE IGE: Galactose-alpha-1,3-galactose IgE: 36.6 kU/L — ABNORMAL HIGH (ref <0.10)

## 2019-03-03 ENCOUNTER — Encounter: Payer: Self-pay | Admitting: Family Medicine

## 2019-03-03 DIAGNOSIS — Z91018 Allergy to other foods: Secondary | ICD-10-CM | POA: Insufficient documentation

## 2019-03-07 ENCOUNTER — Encounter: Payer: Self-pay | Admitting: Family Medicine

## 2019-03-12 ENCOUNTER — Other Ambulatory Visit: Payer: Self-pay | Admitting: *Deleted

## 2019-03-12 DIAGNOSIS — Z122 Encounter for screening for malignant neoplasm of respiratory organs: Secondary | ICD-10-CM

## 2019-03-12 DIAGNOSIS — F17210 Nicotine dependence, cigarettes, uncomplicated: Secondary | ICD-10-CM

## 2019-03-25 ENCOUNTER — Other Ambulatory Visit: Payer: Self-pay

## 2019-03-25 ENCOUNTER — Ambulatory Visit (INDEPENDENT_AMBULATORY_CARE_PROVIDER_SITE_OTHER): Payer: 59 | Admitting: Acute Care

## 2019-03-25 ENCOUNTER — Ambulatory Visit
Admission: RE | Admit: 2019-03-25 | Discharge: 2019-03-25 | Disposition: A | Discharge status: Home / Self Care | Payer: 59 | Source: Ambulatory Visit | Attending: Acute Care | Admitting: Acute Care

## 2019-03-25 ENCOUNTER — Encounter: Payer: Self-pay | Admitting: Acute Care

## 2019-03-25 VITALS — BP 138/72 | HR 84 | Ht 72.0 in | Wt 255.2 lb

## 2019-03-25 DIAGNOSIS — Z122 Encounter for screening for malignant neoplasm of respiratory organs: Secondary | ICD-10-CM

## 2019-03-25 DIAGNOSIS — F17210 Nicotine dependence, cigarettes, uncomplicated: Secondary | ICD-10-CM

## 2019-03-25 NOTE — Progress Notes (Addendum)
Shared Decision Making Visit Lung Cancer Screening Program [phone])   Eligibility:  Age 58 y.o.  Pack Years Smoking History Calculation 46 pack year smoking history (# packs/per year x # years smoked)  Recent History of coughing up blood  no  Unexplained weight loss? no ( >Than 15 pounds within the last 6 months )  Prior History Lung / other cancer no (Diagnosis within the last 5 years already requiring surveillance chest CT Scans).  Smoking Status Current Smoker  Former Smokers: Years since quit: NA  Quit Date: NA  Visit Components:  Discussion included one or more decision making aids. yes  Discussion included risk/benefits of screening. yes  Discussion included potential follow up diagnostic testing for abnormal scans. yes  Discussion included meaning and risk of over diagnosis. yes  Discussion included meaning and risk of False Positives. yes  Discussion included meaning of total radiation exposure. yes  Counseling Included:  Importance of adherence to annual lung cancer LDCT screening. yes  Impact of comorbidities on ability to participate in the program. yes  Ability and willingness to under diagnostic treatment. yes  Smoking Cessation Counseling:  Current Smokers:   Discussed importance of smoking cessation. yes  Information about tobacco cessation classes and interventions provided to patient. yes  Patient provided with "ticket" for LDCT Scan. yes  Symptomatic Patient. no  Counseling:NA  Diagnosis Code: Tobacco Use Z72.0  Asymptomatic Patient yes  Counseling (Intermediate counseling: > three minutes counseling) [id number]  Former Smokers:   Discussed the importance of maintaining cigarette abstinence. yes  Diagnosis Code: Personal History of Nicotine Dependence. [id number]  Information about tobacco cessation classes and interventions provided to patient. Yes  Patient provided with "ticket" for LDCT Scan. yes  Written Order for Lung Cancer  Screening with LDCT placed in Epic. Yes (CT Chest Lung Cancer Screening Low Dose W/O CM) [id number] Z12.2-Screening of respiratory organs Z87.891-Personal history of nicotine dependence  BP 138/72 (BP Location: Right Arm, Cuff Size: Normal)   Pulse 84   Ht 6' (1.829 m)   Wt 255 lb 3.2 oz (115.8 kg)   SpO2 98%   BMI 34.61 kg/m   I have spent 25 minutes of face to face time with Mr. Baalman discussing the risks and benefits of lung cancer screening. We viewed a power point together that explained in detail the above noted topics. We paused at intervals to allow for questions to be asked and answered to ensure understanding.We discussed that the single most powerful action that she can take to decrease her risk of developing lung cancer is to quit smoking. We discussed whether or not she is ready to commit to setting a quit date. We discussed options for tools to aid in quitting smoking including nicotine replacement therapy, non-nicotine medications, support groups, Quit Smart classes, and behavior modification. We discussed that often times setting smaller, more achievable goals, such as eliminating 1 cigarette a day for a week and then 2 cigarettes a day for a week can be helpful in slowly decreasing the number of cigarettes smoked. This allows for a sense of accomplishment as well as providing a clinical benefit. I gave her the " Be Stronger Than Your Excuses" card with contact information for community resources, classes, free nicotine replacement therapy, and access to mobile apps, text messaging, and on-line smoking cessation help. I have also given her my card and contact information in the event she needs to contact me. We discussed the time and location of the scan, and that either [facility]  Gerarda Fraction RN or I will call with the results within 24-48 hours of receiving them. I have offered her  a copy of the power point we viewed  as a resource in the event they need reinforcement of the concepts we  discussed today in the office. The patient verbalized understanding of all of  the above and had no further questions upon leaving the office. They have my contact information in the event they have any further questions.  I spent 3 minutes counseling on smoking cessation and the health risks of continued tobacco abuse.  I explained to the patient that there has been a high incidence of coronary artery disease noted on these exams. I explained that this is a non-gated exam therefore degree or severity cannot be determined. This patient is currently on statin therapy. I have asked the patient to follow-up with their PCP regarding any incidental finding of coronary artery disease and management with diet or medication as their PCP  feels is clinically indicated. The patient verbalized understanding of the above and had no further questions upon completion of the visit.  Pt has quit smoking as of 2 weeks ago. I have encouraged him to keep up the great work.  This appointment was 30 min long with over 50% of the time in direct face-to-face patient care, assessment, plan of care, and follow-up.    Magdalen Spatz, NP 03/25/2019 4:22 PM

## 2019-03-28 ENCOUNTER — Telehealth: Payer: Self-pay | Admitting: Family Medicine

## 2019-03-28 ENCOUNTER — Other Ambulatory Visit: Payer: Self-pay | Admitting: *Deleted

## 2019-03-28 DIAGNOSIS — Z122 Encounter for screening for malignant neoplasm of respiratory organs: Secondary | ICD-10-CM

## 2019-03-28 DIAGNOSIS — F17210 Nicotine dependence, cigarettes, uncomplicated: Secondary | ICD-10-CM

## 2019-03-28 NOTE — Telephone Encounter (Signed)
Notify pt.  I saw the CT report.  He has some calcifications in his cardiac arteries.  Treatment is to continue statin and work on DM2 control, make sure BP is controlled.  In the absence of chest pain or SOB, no other treatment needed for that right now. Thanks.

## 2019-03-28 NOTE — Telephone Encounter (Signed)
Pt informed

## 2019-04-04 ENCOUNTER — Encounter: Payer: Self-pay | Admitting: Internal Medicine

## 2019-04-04 ENCOUNTER — Ambulatory Visit: Payer: 59 | Admitting: Internal Medicine

## 2019-04-04 ENCOUNTER — Other Ambulatory Visit: Payer: Self-pay

## 2019-04-04 DIAGNOSIS — G4733 Obstructive sleep apnea (adult) (pediatric): Secondary | ICD-10-CM

## 2019-04-04 DIAGNOSIS — R911 Solitary pulmonary nodule: Secondary | ICD-10-CM | POA: Diagnosis not present

## 2019-04-04 DIAGNOSIS — IMO0001 Reserved for inherently not codable concepts without codable children: Secondary | ICD-10-CM

## 2019-04-04 NOTE — Assessment & Plan Note (Signed)
Benefits from CPAP, needs supplies Plan- continue CPAP 14, replace mask and supplies

## 2019-04-04 NOTE — Progress Notes (Signed)
HPI-  male former smoker followed for obstructive sleep apnea/CPAP, complicated by DM 2, muscle cramps NPSG was prior to 2009- not in EMR  --------------------------------------------------------------------------  03/29/2018- 58 year old male former smoker followed for OSA/CPAP, complicated by DM 2, muscle cramps CPAP 14/Advanced -----Follows for: OSA, doing well on CPAP, denies any issues Some difficulty initiating and maintaining sleep-"busy brain". Very comfortable with his CPAP at 14.  Download compliance 100% AHI 3.5/hour.  We discussed option to try AutoPap but he chooses not to change.  04/04/2019- 58 year old male former smoker followed for OSA/CPAP, complicated by DM 2, muscle cramps CPAP 14/Advanced Quit smoking again recently and has met with Eric Form about low dose CT cancer screening program -----OSA on CPAP, DME: [facility]; pt states he's likely due for new supplies  Body weight today 258 lbs Download compliance 100%, AHI 3.1/ hr He has quit smoking again. We reviewed his CT report w 2 mm nodule. Breathing is comfortable. Continues pleased with CPAP but needs mask and supplies. Reviewed download. CT chest Low Dose cancer screen 03/26/2019-  IMPRESSION: 1. Lung-RADS 2, benign appearance or behavior. Continue annual screening with low-dose chest CT without contrast in 12 months. 2.  Aortic Atherosclerois (ICD10-170.0)  ROS- see HPI   + sign = positive Constitutional:   No-   weight loss, night sweats, fevers, chills,  fatigue, lassitude. HEENT:   No-  headaches, difficulty swallowing, tooth/dental problems, sore throat,       No-  sneezing, itching, ear ache, nasal congestion, post nasal drip,  CV:  No-   chest pain, orthopnea, PND, swelling in lower extremities, anasarca, dizziness, palpitations Resp: No-   shortness of breath with exertion or at rest.              No-   productive cough,  No non-productive cough,  No-  coughing up of blood.              No-   change in  color of mucus.  No- wheezing.   Skin: No-   rash or lesions. GI:  No-   heartburn, indigestion, abdominal pain, nausea, vomiting,  GU:  MS:  No-   joint pain or swelling.  . Neuro- nothing unusual  Psych:  No- change in mood or affect. No depression or anxiety.  No memory loss.  OBJ- General- Alert, Oriented, Affect-appropriate, calm, Distress- none acute,                             + obese Skin- rash-none, lesions- none, excoriation- none Lymphadenopathy- none Head- atraumatic            Eyes- Gross vision intact, PERRLA, conjunctivae clear secretions            Ears- Hearing, canals-normal            Nose- Clear, no-Septal dev, mucus, polyps, erosion, perforation             Throat- Mallampati III , mucosa clear , drainage- none, tonsils- atrophic Neck- flexible , trachea midline, no stridor , thyroid nl, carotid no bruit Chest - symmetrical excursion , unlabored           Heart/CV- RRR , no murmur , no gallop  , no rub, nl s1 s2                           - JVD- none , edema- none, stasis changes-  none, varices- none           Lung- clear to P&A, wheeze- none, cough- none , dullness-none, rub- none           Chest wall-  Abd-  Br/ Gen/ Rectal- Not done, not indicated Extrem- cyanosis- none, clubbing, none, atrophy- none, strength- nl Neuro- grossly intact to observation

## 2019-04-04 NOTE — Assessment & Plan Note (Signed)
Needs to make more effort to get weight down. Obesity will impact several medical problems.

## 2019-04-04 NOTE — Assessment & Plan Note (Signed)
This 2 mm nodule is low risk, but appropriate to maintain screening as planned.

## 2019-04-04 NOTE — Patient Instructions (Addendum)
Order- DME [facility] please replace mask of choice, and supplies, continue CPAP 14, humidifier, AirView/ card  Please call if we can help

## 2019-04-05 ENCOUNTER — Ambulatory Visit
Admission: RE | Admit: 2019-04-05 | Discharge: 2019-04-05 | Disposition: A | Discharge status: Home / Self Care | Payer: 59 | Source: Ambulatory Visit | Attending: Cardiovascular Disease | Admitting: Cardiovascular Disease

## 2019-04-05 DIAGNOSIS — E785 Hyperlipidemia, unspecified: Secondary | ICD-10-CM

## 2019-04-12 ENCOUNTER — Other Ambulatory Visit (INDEPENDENT_AMBULATORY_CARE_PROVIDER_SITE_OTHER): Payer: 59

## 2019-04-12 DIAGNOSIS — Z125 Encounter for screening for malignant neoplasm of prostate: Secondary | ICD-10-CM

## 2019-04-12 DIAGNOSIS — E119 Type 2 diabetes mellitus without complications: Secondary | ICD-10-CM

## 2019-04-12 NOTE — Addendum Note (Signed)
Addended by: Ellamae Sia on: 04/12/2019 02:54 PM   Modules accepted: Orders

## 2019-04-13 LAB — HEMOGLOBIN A1C
Hgb A1c MFr Bld: 7.4 % of total Hgb — ABNORMAL HIGH (ref <5.7)
Mean Plasma Glucose: 166 (calc)
eAG (mmol/L): 9.2 (calc)

## 2019-04-13 LAB — PSA: PSA: 0.5 ng/mL (ref <=4.0)

## 2019-04-15 ENCOUNTER — Encounter: Payer: Self-pay | Admitting: Family Medicine

## 2019-04-15 ENCOUNTER — Ambulatory Visit: Payer: 59 | Admitting: Family Medicine

## 2019-04-15 ENCOUNTER — Other Ambulatory Visit: Payer: Self-pay

## 2019-04-15 ENCOUNTER — Other Ambulatory Visit: Payer: Self-pay | Admitting: Family Medicine

## 2019-04-15 VITALS — BP 122/64 | HR 79 | Temp 97.4°F | Ht 72.0 in | Wt 259.1 lb

## 2019-04-15 DIAGNOSIS — E119 Type 2 diabetes mellitus without complications: Secondary | ICD-10-CM | POA: Diagnosis not present

## 2019-04-15 DIAGNOSIS — E785 Hyperlipidemia, unspecified: Secondary | ICD-10-CM

## 2019-04-15 DIAGNOSIS — Z91018 Allergy to other foods: Secondary | ICD-10-CM

## 2019-04-15 NOTE — Patient Instructions (Addendum)
Let me talk to cardiology about your calcium scoring/CT scan.  Thank you for your effort with stopping smoking.  Thanks for getting a flu shot.  Recheck A1c in about 3-4 months with A1c at the visit.  Take care.  Glad to see you.

## 2019-04-15 NOTE — Progress Notes (Signed)
Diabetes:  Using medications without difficulties: yes. Hypoglycemic episodes: no Hyperglycemic episodes: no Feet problems: no, prev lesion healed.  Blood Sugars averaging: 160s in the AM.   eye exam within last year: due, d/w pt.   A1c up some, d/w pt. He'll see if he can get A1c lower with diet and exercise.    Alpha gal.  He had allergy clinic eval done.  Advised to avoid mammalian meat.    Cardiac calcium scoring d/w pt.  BP controlled.  A1c up slightly.  Still on statin. He quit smoking.   PSA wnl, d/w pt.    PMH and SH reviewed Meds, vitals, and allergies reviewed.   ROS: Per HPI unless specifically indicated in ROS section   GEN: nad, alert and oriented HEENT: ncat NECK: supple w/o LA CV: rrr. PULM: ctab, no inc wob ABD: soft, +bs EXT: no edema SKIN: no acute rash  Diabetic foot exam: Normal inspection No skin breakdown B 1st toe calluses  Normal DP pulses Normal sensation to light touch and monofilament Nails normal

## 2019-04-17 ENCOUNTER — Telehealth: Payer: Self-pay | Admitting: Family Medicine

## 2019-04-17 DIAGNOSIS — E119 Type 2 diabetes mellitus without complications: Secondary | ICD-10-CM

## 2019-04-17 MED ORDER — ATORVASTATIN CALCIUM 80 MG PO TABS: 80.0000 mg | ORAL_TABLET | Freq: Every day | ORAL | 3 refills | Status: DC

## 2019-04-17 NOTE — Telephone Encounter (Signed)
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  Most recent LDL 99 in spite of pravastatin 80 mg a day.  Were you going to change his statin?  I did want to duplicate orders.  I can change him over to Lipitor 80 unless you have other suggestions.  Many thanks.

## 2019-04-17 NOTE — Telephone Encounter (Signed)
Thank you.  I will call him and get him set up with Lipitor and we will recheck his lipids.  ===================  Edward Wright-please call patient.  Reasonable to change from pravastatin to Lipitor.  I sent the prescription.  He can stop pravastatin on one day and then start Lipitor the next day.  If he has muscle aches on Lipitor then let me know.   We can recheck lipids with A1c before next office visit.  I put in the orders. Thanks.

## 2019-04-17 NOTE — Assessment & Plan Note (Signed)
A1c up some, d/w pt. He'll see if he can get A1c lower with diet and exercise.

## 2019-04-17 NOTE — Addendum Note (Signed)
Addended by: Tonia Ghent on: 04/17/2019 09:57 PM   Modules accepted: Orders

## 2019-04-17 NOTE — Telephone Encounter (Signed)
I have not seen him, only helped him order the scan (through family) Sometimes I like zetia in these situation, but lipitor /crestor works well  Happy to see him if you feel it is needed

## 2019-04-17 NOTE — Assessment & Plan Note (Signed)
He had allergy clinic eval done.  Advised to avoid mammalian meat.

## 2019-04-17 NOTE — Assessment & Plan Note (Signed)
Cardiac calcium scoring d/w pt.  BP controlled.  A1c up slightly.  Still on statin. He quit smoking.  I will discuss his case with cardiology.

## 2019-04-18 NOTE — Telephone Encounter (Signed)
Patient advised.  Lab appt scheduled.  

## 2019-05-24 ENCOUNTER — Other Ambulatory Visit: Payer: Self-pay | Admitting: Family Medicine

## 2019-05-24 NOTE — Telephone Encounter (Signed)
Electronic refill request. Diazepam Last office visit:   04/15/2019 Last Filled:    30 tablet 1 01/04/2019   Electronic refill request. Tramadol Last office visit:  04/15/2019 Last Filled:   30 tablet 1 01/04/2019   Please advise.

## 2019-05-26 NOTE — Telephone Encounter (Signed)
Sent. Thanks.   

## 2019-07-10 ENCOUNTER — Ambulatory Visit: Payer: BC Managed Care – PPO | Attending: Internal Medicine

## 2019-07-10 DIAGNOSIS — Z20822 Contact with and (suspected) exposure to covid-19: Secondary | ICD-10-CM | POA: Insufficient documentation

## 2019-07-11 LAB — NOVEL CORONAVIRUS, NAA: SARS-CoV-2, NAA: NOT DETECTED

## 2019-07-12 ENCOUNTER — Other Ambulatory Visit: Payer: 59

## 2019-07-16 ENCOUNTER — Ambulatory Visit: Payer: 59 | Admitting: Family Medicine

## 2019-07-18 ENCOUNTER — Encounter: Payer: Self-pay | Admitting: Family Medicine

## 2019-07-18 ENCOUNTER — Ambulatory Visit: Payer: 59 | Admitting: Family Medicine

## 2019-07-18 NOTE — Telephone Encounter (Signed)
I am working on [employer] for these 2 medications.  I will give an update when I complete those PA's.

## 2019-07-22 NOTE — Telephone Encounter (Signed)
Thanks.  Please let me know what you hear.   

## 2019-07-23 ENCOUNTER — Telehealth: Payer: Self-pay | Admitting: *Deleted

## 2019-07-23 NOTE — Telephone Encounter (Signed)
PA submitted thru [facility] for [place], PA denied and alternatives given to Dr. Damita Dunnings.

## 2019-07-26 NOTE — Telephone Encounter (Signed)
PA denied for Basaglar.  Patient says he did fine on Lantus previously and was only switched because his [facility] wouldn't pay for Lantus but would pay for [place].  Now it's the opposite with [employer], Nancee Liter is not on their formulary and Lantus is.

## 2019-07-28 MED ORDER — LANTUS SOLOSTAR 100 UNIT/ML ~~LOC~~ SOPN: PEN_INJECTOR | SUBCUTANEOUS | 99 refills | Status: DC

## 2019-07-28 NOTE — Addendum Note (Signed)
Addended by: Tonia Ghent on: 07/28/2019 12:32 PM   Modules accepted: Orders

## 2019-07-28 NOTE — Telephone Encounter (Signed)
I changed the rx to lantus.  No change in sig.  It is a one to one substitution.  Please update patient.  Needs A1c at office visit when possible.  Thanks.

## 2019-07-29 ENCOUNTER — Telehealth: Payer: Self-pay | Admitting: *Deleted

## 2019-07-29 NOTE — Telephone Encounter (Signed)
PA for Onglyza submitted thru [employer], awaiting response.

## 2019-07-29 NOTE — Telephone Encounter (Signed)
Patient advised.

## 2019-07-31 NOTE — Telephone Encounter (Signed)
Please see if this can be appealed.  Please see hardcopy notes.  Thanks.

## 2019-07-31 NOTE — Telephone Encounter (Signed)
Received fax from [facility] stating PA for Onglyza 5 mg was denied.  Pleae send alternate therapy if appropriate.

## 2019-08-13 ENCOUNTER — Encounter: Payer: Self-pay | Admitting: *Deleted

## 2019-08-13 DIAGNOSIS — G4733 Obstructive sleep apnea (adult) (pediatric): Secondary | ICD-10-CM | POA: Diagnosis not present

## 2019-08-13 NOTE — Telephone Encounter (Signed)
Edward Wright was presented as an alternative and a message has been sent to the patient asking if he is agreeable to try Jardiance.  Awaiting response on MyChart/

## 2019-08-15 ENCOUNTER — Other Ambulatory Visit: Payer: Self-pay | Admitting: Family Medicine

## 2019-08-15 MED ORDER — JARDIANCE 10 MG PO TABS: 10.0000 mg | ORAL_TABLET | Freq: Every day | ORAL | 3 refills | Status: DC

## 2019-08-15 NOTE — Telephone Encounter (Signed)
See mychart message.  rx sent.  Thanks.

## 2019-08-15 NOTE — Telephone Encounter (Signed)
Patient is willing to try the alternative that his insurance company suggested.

## 2019-08-16 DIAGNOSIS — Z20828 Contact with and (suspected) exposure to other viral communicable diseases: Secondary | ICD-10-CM | POA: Diagnosis not present

## 2019-08-19 ENCOUNTER — Encounter: Payer: Self-pay | Admitting: Family Medicine

## 2019-09-10 ENCOUNTER — Telehealth: Payer: Self-pay | Admitting: Internal Medicine

## 2019-09-10 DIAGNOSIS — G4733 Obstructive sleep apnea (adult) (pediatric): Secondary | ICD-10-CM

## 2019-09-10 NOTE — Telephone Encounter (Signed)
Spoke with pt. He is needing an order for a new CPAP machine. His current machine quit working last night. Order has been placed. Nothing further was needed.

## 2019-09-12 ENCOUNTER — Telehealth: Payer: Self-pay

## 2019-09-12 NOTE — Telephone Encounter (Signed)
PA form completed on [employer]Meds. Waiting on response.

## 2019-09-13 DIAGNOSIS — G4733 Obstructive sleep apnea (adult) (pediatric): Secondary | ICD-10-CM | POA: Diagnosis not present

## 2019-09-30 ENCOUNTER — Encounter: Payer: Self-pay | Admitting: Family Medicine

## 2019-10-14 DIAGNOSIS — G4733 Obstructive sleep apnea (adult) (pediatric): Secondary | ICD-10-CM | POA: Diagnosis not present

## 2019-10-14 IMAGING — CT CT CHEST LUNG CANCER SCREENING LOW DOSE W/O CM
2 of 5 series · 15 of 40 positions shown, 18 images · non-contrast
Comparison: None.

CLINICAL DATA: 58-year-old male with 46 pack-year history of
smoking. Lung cancer screening.

EXAM:
CT CHEST WITHOUT CONTRAST LOW-DOSE FOR LUNG CANCER SCREENING
TECHNIQUE: Multidetector CT imaging of the chest was performed following the
standard protocol without IV contrast.

[Series 4: lung 1.00 br44 cor · coronal · 0.64mm/px · 3 of 359 slices shown]
[im 72/359  lung]
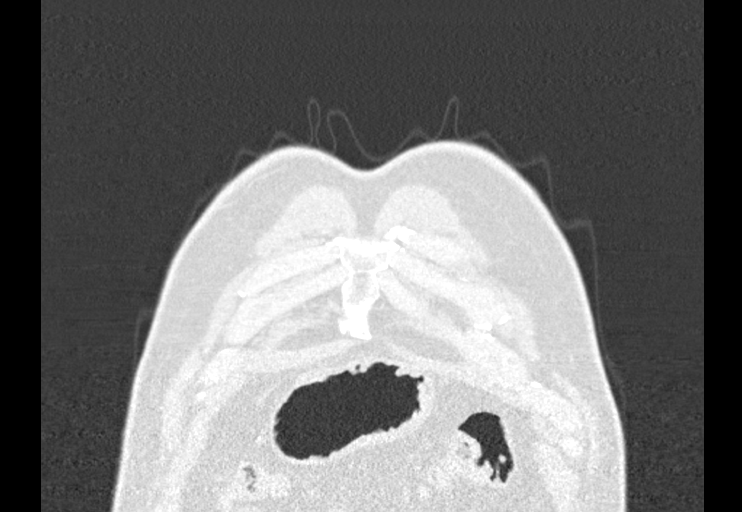
[im 144/359  lung]
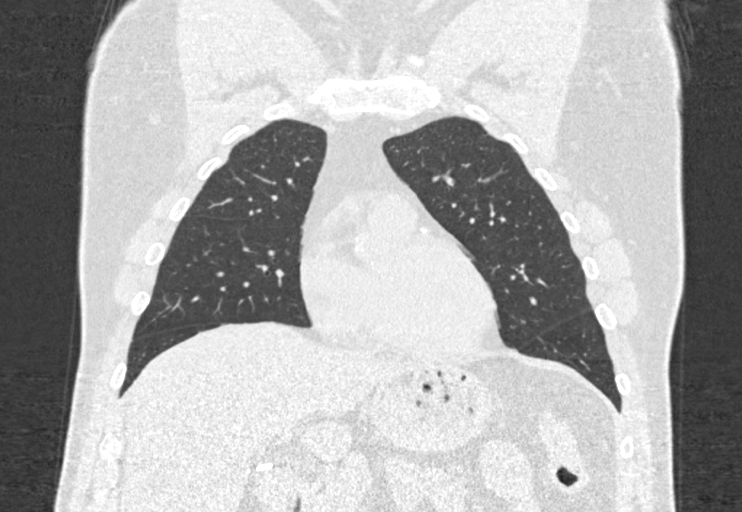
[im 215/359  lung]
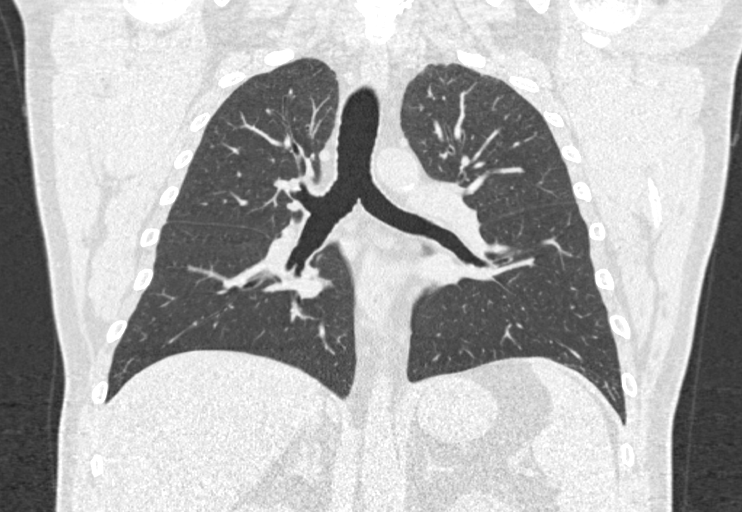

[Series 9: lung 1.00 br60 axial · axial · 0.85mm/px · z∈[-1002,-708]mm · 12 of 328 slices shown, 15 images]
[im 17/328  mediastinal]
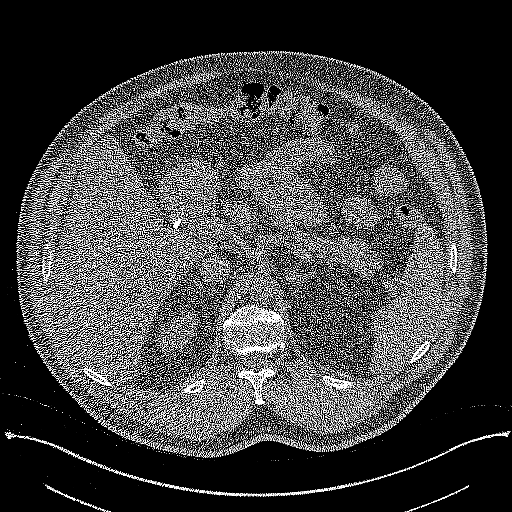
[im 17/328  lung]
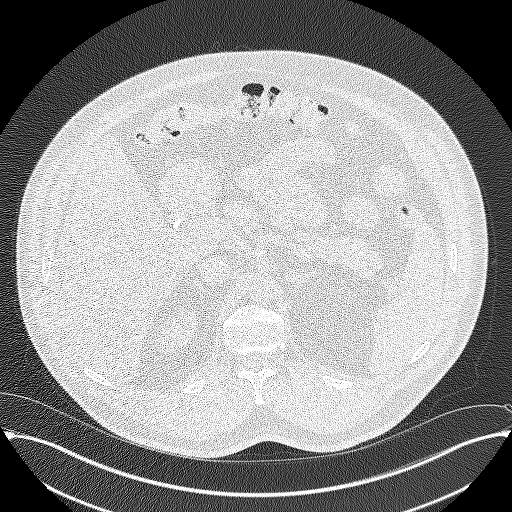
[im 50/328  lung]
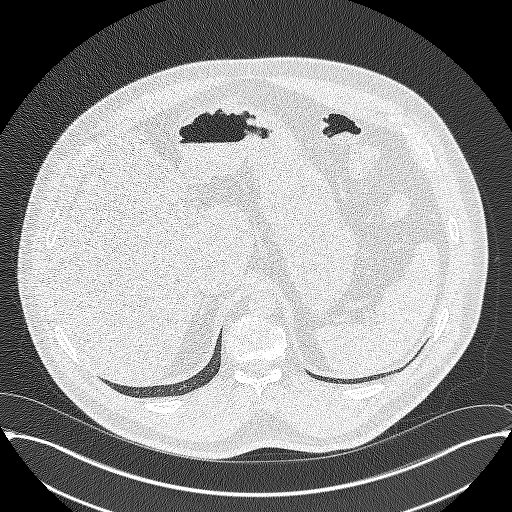
[im 66/328  lung]
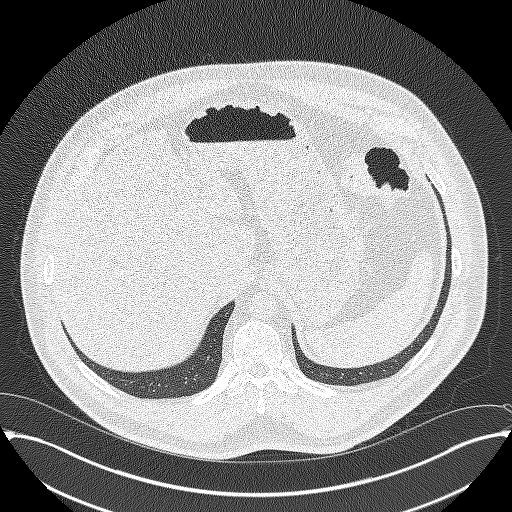
[im 99/328  lung]
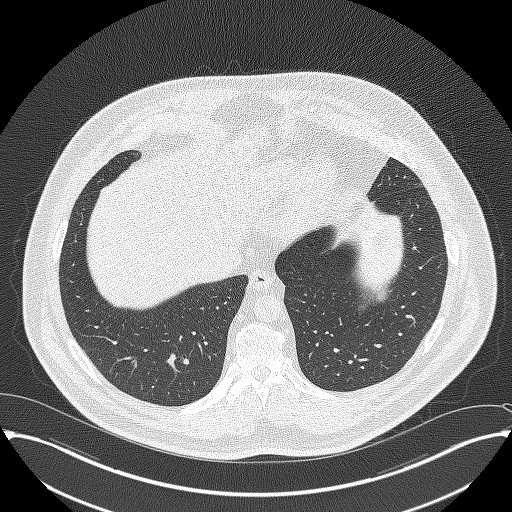
[im 131/328  mediastinal]
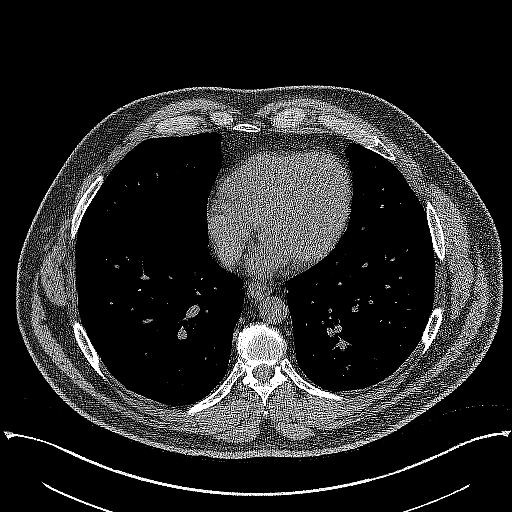
[im 131/328  lung]
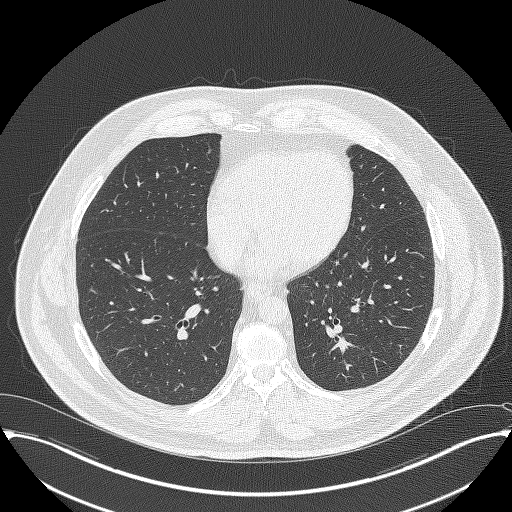
[im 148/328  lung]
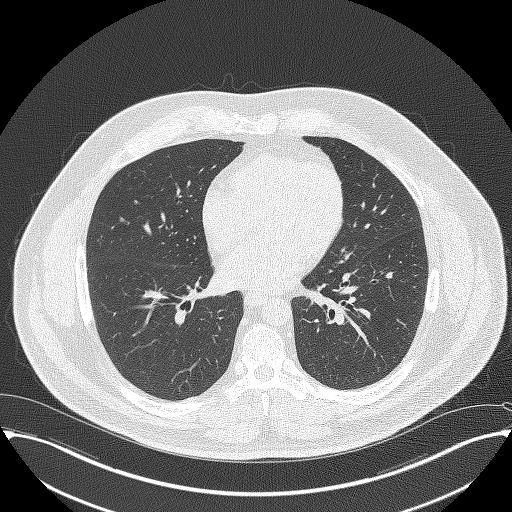
[im 180/328  lung]
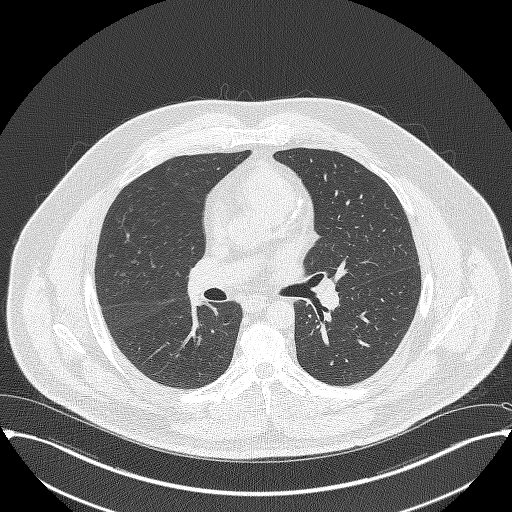
[im 197/328  lung]
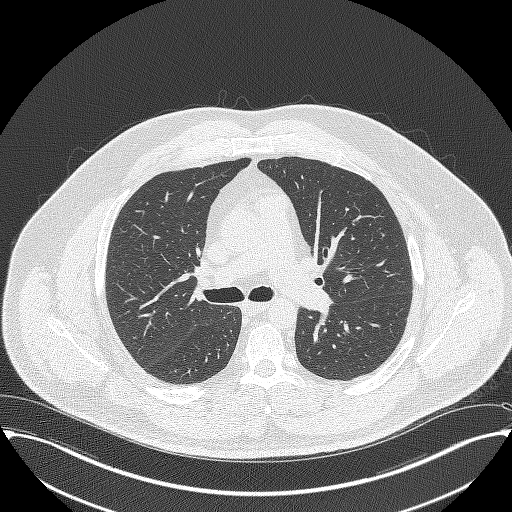
[im 229/328  mediastinal]
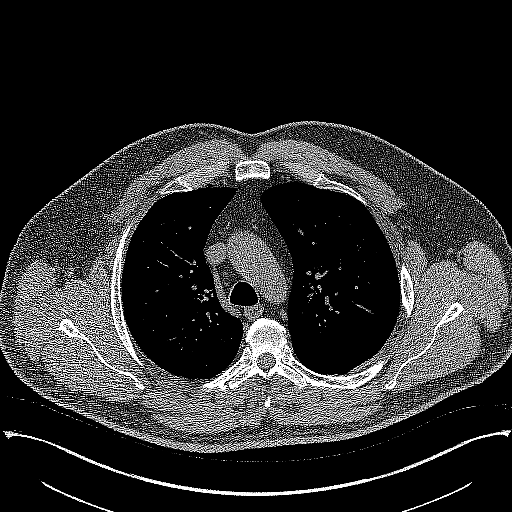
[im 229/328  lung]
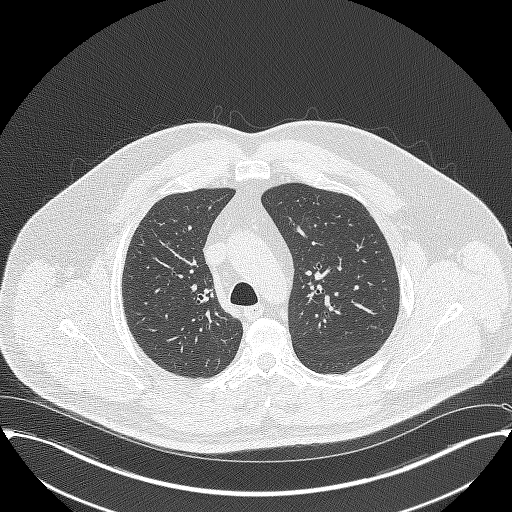
[im 262/328  lung]
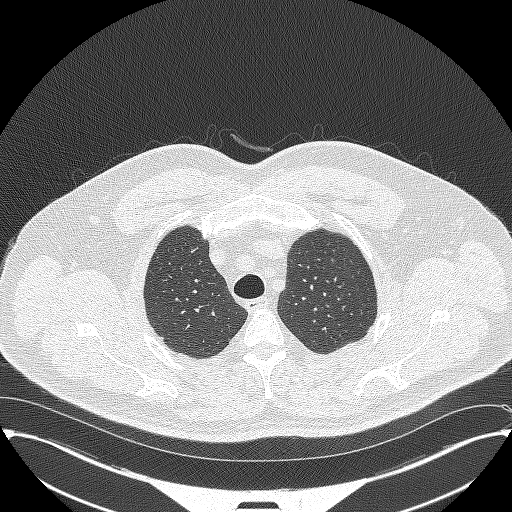
[im 278/328  lung]
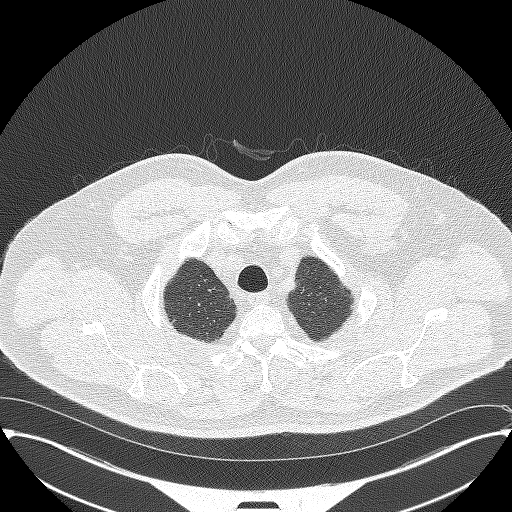
[im 311/328  lung]
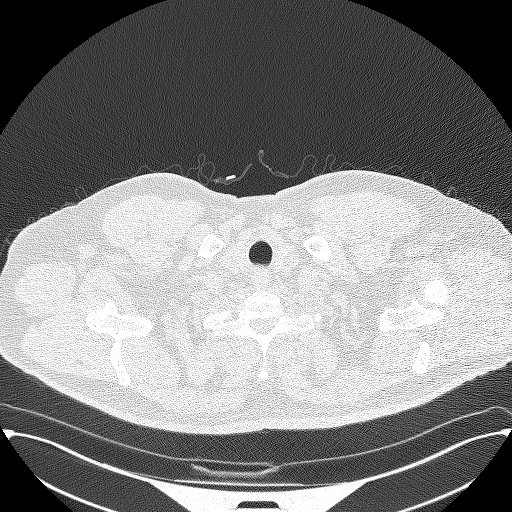

[15 of 40 positions shown; findings below may reference images not displayed]

FINDINGS: Cardiovascular: The heart size is normal. No substantial pericardial
effusion. Coronary artery calcification is evident. Atherosclerotic
calcification is noted in the wall of the thoracic aorta.

Mediastinum/Nodes: No mediastinal lymphadenopathy. No evidence for
gross hilar lymphadenopathy although assessment is limited by the
lack of intravenous contrast on today's study. The esophagus has
normal imaging features. There is no axillary lymphadenopathy.

Lungs/Pleura: 2 mm subpleural right upper lobe pulmonary nodule. No
suspicious pulmonary nodule or mass. No focal airspace
consolidation. No pleural effusion.

Upper Abdomen: The liver shows diffusely decreased attenuation
suggesting fat deposition.

Musculoskeletal: No worrisome lytic or sclerotic osseous
abnormality.
IMPRESSION: 1. Lung-RADS 2, benign appearance or behavior. Continue annual
screening with low-dose chest CT without contrast in 12 months.
2.  Aortic Atherosclerois (FL2JO-170.0)

## 2019-11-13 DIAGNOSIS — G4733 Obstructive sleep apnea (adult) (pediatric): Secondary | ICD-10-CM | POA: Diagnosis not present

## 2019-12-14 DIAGNOSIS — G4733 Obstructive sleep apnea (adult) (pediatric): Secondary | ICD-10-CM | POA: Diagnosis not present

## 2020-01-13 ENCOUNTER — Other Ambulatory Visit: Payer: Self-pay | Admitting: Family Medicine

## 2020-01-13 ENCOUNTER — Encounter: Payer: Self-pay | Admitting: Family Medicine

## 2020-01-13 MED ORDER — BD PEN NEEDLE MINI U/F 31G X 5 MM MISC: 3 refills | Status: DC

## 2020-01-15 LAB — HM DIABETES EYE EXAM

## 2020-01-26 ENCOUNTER — Other Ambulatory Visit: Payer: Self-pay | Admitting: Family Medicine

## 2020-01-26 DIAGNOSIS — E119 Type 2 diabetes mellitus without complications: Secondary | ICD-10-CM

## 2020-01-26 DIAGNOSIS — Z125 Encounter for screening for malignant neoplasm of prostate: Secondary | ICD-10-CM

## 2020-01-28 ENCOUNTER — Other Ambulatory Visit: Payer: Self-pay | Admitting: Family Medicine

## 2020-02-07 ENCOUNTER — Other Ambulatory Visit (INDEPENDENT_AMBULATORY_CARE_PROVIDER_SITE_OTHER): Payer: BC Managed Care – PPO

## 2020-02-07 ENCOUNTER — Other Ambulatory Visit: Payer: Self-pay

## 2020-02-07 DIAGNOSIS — Z125 Encounter for screening for malignant neoplasm of prostate: Secondary | ICD-10-CM | POA: Diagnosis not present

## 2020-02-07 DIAGNOSIS — E119 Type 2 diabetes mellitus without complications: Secondary | ICD-10-CM | POA: Diagnosis not present

## 2020-02-07 LAB — COMPREHENSIVE METABOLIC PANEL
ALT: 30 U/L (ref 0–53)
AST: 23 U/L (ref 0–37)
Albumin: 4.2 g/dL (ref 3.5–5.2)
Alkaline Phosphatase: 61 U/L (ref 39–117)
BUN: 16 mg/dL (ref 6–23)
CO2: 29 mEq/L (ref 19–32)
Calcium: 9.3 mg/dL (ref 8.4–10.5)
Chloride: 103 mEq/L (ref 96–112)
Creatinine, Ser: 1.08 mg/dL (ref 0.40–1.50)
GFR: 69.5 mL/min (ref >60.00)
Glucose, Bld: 221 mg/dL — ABNORMAL HIGH (ref 70–99)
Potassium: 4.5 mEq/L (ref 3.5–5.1)
Sodium: 139 mEq/L (ref 135–145)
Total Bilirubin: 0.6 mg/dL (ref 0.2–1.2)
Total Protein: 6.6 g/dL (ref 6.0–8.3)

## 2020-02-07 LAB — LIPID PANEL
Cholesterol: 108 mg/dL (ref 0–200)
HDL: 23.8 mg/dL — ABNORMAL LOW (ref >39.00)
LDL Cholesterol: 61 mg/dL (ref 0–99)
NonHDL: 84.4
Total CHOL/HDL Ratio: 5
Triglycerides: 117 mg/dL (ref 0.0–149.0)
VLDL: 23.4 mg/dL (ref 0.0–40.0)

## 2020-02-07 LAB — HEMOGLOBIN A1C: Hgb A1c MFr Bld: 7.8 % — ABNORMAL HIGH (ref 4.6–6.5)

## 2020-02-07 LAB — PSA: PSA: 0.74 ng/mL (ref 0.10–4.00)

## 2020-02-14 ENCOUNTER — Encounter: Payer: Self-pay | Admitting: Family Medicine

## 2020-02-18 ENCOUNTER — Other Ambulatory Visit: Payer: Self-pay | Admitting: Family Medicine

## 2020-02-20 ENCOUNTER — Other Ambulatory Visit: Payer: Self-pay

## 2020-02-20 ENCOUNTER — Encounter: Payer: Self-pay | Admitting: Family Medicine

## 2020-02-20 ENCOUNTER — Ambulatory Visit (INDEPENDENT_AMBULATORY_CARE_PROVIDER_SITE_OTHER): Payer: BC Managed Care – PPO | Admitting: Family Medicine

## 2020-02-20 VITALS — BP 130/74 | HR 76 | Temp 97.2°F | Ht 72.0 in | Wt 256.0 lb

## 2020-02-20 DIAGNOSIS — F5101 Primary insomnia: Secondary | ICD-10-CM

## 2020-02-20 DIAGNOSIS — Z0000 Encounter for general adult medical examination without abnormal findings: Secondary | ICD-10-CM | POA: Diagnosis not present

## 2020-02-20 DIAGNOSIS — E119 Type 2 diabetes mellitus without complications: Secondary | ICD-10-CM

## 2020-02-20 DIAGNOSIS — Z7189 Other specified counseling: Secondary | ICD-10-CM

## 2020-02-20 DIAGNOSIS — M549 Dorsalgia, unspecified: Secondary | ICD-10-CM

## 2020-02-20 DIAGNOSIS — E785 Hyperlipidemia, unspecified: Secondary | ICD-10-CM

## 2020-02-20 DIAGNOSIS — Z91018 Allergy to other foods: Secondary | ICD-10-CM

## 2020-02-20 MED ORDER — LANTUS SOLOSTAR 100 UNIT/ML ~~LOC~~ SOPN: 90.0000 [IU] | PEN_INJECTOR | Freq: Every day | SUBCUTANEOUS | Status: DC

## 2020-02-20 MED ORDER — METFORMIN HCL 500 MG PO TABS: ORAL_TABLET | ORAL | Status: DC

## 2020-02-20 NOTE — Progress Notes (Signed)
This visit occurred during the SARS-CoV-2 public health emergency.  Safety protocols were in place, including screening questions prior to the visit, additional usage of staff PPE, and extensive cleaning of exam room while observing appropriate contact time as indicated for disinfecting solutions.  CPE- See plan.  Routine anticipatory guidance given to patient.  See health maintenance.  The possibility exists that previously documented standard health maintenance information may have been brought forward from a previous encounter into this note.  If needed, that same information has been updated to reflect the current situation based on today's encounter.    Tetanus 2011 Flu done yearly at work.   PNA 2014 Shingles d/w pt.    covid vaccine encouraged.   Colon cancer screening- colonoscopy 2019 PSA 2021 Living will d/w pt. Would have his wife designated if patient were incapacitated.  Diet and exercise d/w pt. Working on both.  Lung cancer screening d/w pt.    Elevated Cholesterol: Using medications without problems: yes Muscle aches: no Diet compliance: encouraged.   Exercise: encouraged  Insomnia.  Some occ disruptions to sleep cycle.  Some nights are better than others.  No recent zaleplon use, only used prn/rarely.    Used tramadol and valium prn for muscle spasms and back pain with relief.  No ADE on med.  No sedation.    Diabetes:  Using medications without difficulties: see below.  Loose stools, on metformin.  Taking 90 units insulin.   Hypoglycemic episodes: rare, not severe, cautions d/w pt.  Hyperglycemic episodes:no Feet problems:no Blood Sugars averaging: 95-140 recently.   eye exam within last year: done July- [facility] Had to stop jardiance due to yeast infections.  Sugar elevation noted after that.    H/o alpha gal.  He has had to cut out dairy in the meantime.  He had some diarrhea but stopping ACE didn't help.  Still on metformin.    PMH and SH  reviewed Meds, vitals, and allergies reviewed.   ROS: Per HPI.  Unless specifically indicated otherwise in HPI, the patient denies:  General: fever. Eyes: acute vision changes ENT: sore throat Cardiovascular: chest pain Respiratory: SOB GI: vomiting GU: dysuria Musculoskeletal: acute back pain Derm: acute rash Neuro: acute motor dysfunction Psych: worsening mood Endocrine: polydipsia Heme: bleeding Allergy: hayfever  GEN: nad, alert and oriented HEENT: ncat NECK: supple w/o LA CV: rrr. PULM: ctab, no inc wob ABD: soft, +bs EXT: no edema SKIN: no acute rash  Diabetic foot exam: Normal inspection No skin breakdown No calluses  Normal DP pulses Normal sensation to light touch and monofilament Nails normal

## 2020-02-20 NOTE — Patient Instructions (Addendum)
Cut the metformin in half and update me in about 1 week.   We'll need to make plans at that point.   If possible, restart the lisinopril.   Check with your insurance to see if they will cover the shingles shot. Get a covid shot whenever possible.  I would get a flu shot each fall.   Take care.  Glad to see you.

## 2020-02-23 ENCOUNTER — Encounter: Payer: Self-pay | Admitting: Family Medicine

## 2020-02-24 NOTE — Assessment & Plan Note (Signed)
Used tramadol and valium prn for muscle spasms and back pain with relief.  No ADE on med.  No sedation.   Continue as needed use.

## 2020-02-24 NOTE — Assessment & Plan Note (Signed)
Tetanus 2011 Flu done yearly at work.   PNA 2014 Shingles d/w pt.    covid vaccine encouraged.   Colon cancer screening- colonoscopy 2019 PSA 2021 Living will d/w pt. Would have his wife designated if patient were incapacitated.  Diet and exercise d/w pt. Working on both.  Lung cancer screening d/w pt.     At this point Covid vaccination is likely more important than any other vaccination.  Discussed with patient.

## 2020-02-24 NOTE — Assessment & Plan Note (Signed)
Routine cautions discussed with patient.

## 2020-02-24 NOTE — Assessment & Plan Note (Signed)
Some occ disruptions to sleep cycle.  Some nights are better than others.  No recent zaleplon use, only used prn/rarely.  Continue as is.

## 2020-02-24 NOTE — Assessment & Plan Note (Signed)
Requesting copy of eye exam. Had to stop jardiance due to yeast infections.  Sugar elevation noted after that.   Given recent loose stools on Metformin, he will cut the metformin in half and update me in about 1 week.   We'll need to make plans at that point.   If possible, restart the lisinopril.   He agrees.

## 2020-02-24 NOTE — Assessment & Plan Note (Signed)
Living will d/w pt.   Would have his wife designated if patient were incapacitated.  

## 2020-02-24 NOTE — Assessment & Plan Note (Signed)
Continue atorvastatin.  Labs discussed with patient.  Continue work on diet and exercise. 

## 2020-02-25 ENCOUNTER — Encounter: Payer: Self-pay | Admitting: *Deleted

## 2020-02-25 ENCOUNTER — Other Ambulatory Visit: Payer: Self-pay | Admitting: Family Medicine

## 2020-02-25 ENCOUNTER — Encounter: Payer: Self-pay | Admitting: Family Medicine

## 2020-02-25 NOTE — Telephone Encounter (Signed)
Electronic refill request. Hydrocortisone (Anusol-HC) Last office visit:   02/20/2020 Last Filled:    12 suppository 2 04/16/2019    Electronic refill request. Diazepam Last office visit:   02/20/2020 Last Filled:    30 tablet 1 05/26/2019

## 2020-02-26 ENCOUNTER — Other Ambulatory Visit: Payer: Self-pay | Admitting: Family Medicine

## 2020-02-26 NOTE — Telephone Encounter (Signed)
Sent. Thanks.   

## 2020-02-27 NOTE — Telephone Encounter (Signed)
Electronic refill request. Tramadol Last office visit:   02/20/2020 Last Filled:    30 tablet 1 05/26/2019

## 2020-02-28 NOTE — Telephone Encounter (Signed)
Sent. Thanks.   

## 2020-03-17 DIAGNOSIS — Z91018 Allergy to other foods: Secondary | ICD-10-CM | POA: Diagnosis not present

## 2020-03-31 ENCOUNTER — Other Ambulatory Visit: Payer: Self-pay | Admitting: *Deleted

## 2020-03-31 DIAGNOSIS — F17210 Nicotine dependence, cigarettes, uncomplicated: Secondary | ICD-10-CM

## 2020-04-06 ENCOUNTER — Other Ambulatory Visit: Payer: Self-pay

## 2020-04-06 ENCOUNTER — Encounter: Payer: Self-pay | Admitting: Internal Medicine

## 2020-04-06 ENCOUNTER — Ambulatory Visit: Payer: BC Managed Care – PPO | Admitting: Internal Medicine

## 2020-04-06 VITALS — BP 130/76 | HR 88 | Temp 97.3°F | Ht 72.0 in | Wt 259.4 lb

## 2020-04-06 DIAGNOSIS — F5101 Primary insomnia: Secondary | ICD-10-CM | POA: Diagnosis not present

## 2020-04-06 DIAGNOSIS — R911 Solitary pulmonary nodule: Secondary | ICD-10-CM | POA: Diagnosis not present

## 2020-04-06 DIAGNOSIS — IMO0001 Reserved for inherently not codable concepts without codable children: Secondary | ICD-10-CM

## 2020-04-06 DIAGNOSIS — G4733 Obstructive sleep apnea (adult) (pediatric): Secondary | ICD-10-CM

## 2020-04-06 NOTE — Patient Instructions (Signed)
Order- DME [employer]- please continue CPAP 14, mask of choice, humidifier, supplies, AirView/ card  Please call if we can help

## 2020-04-06 NOTE — Progress Notes (Signed)
HPI-  male former smoker followed for obstructive sleep apnea/CPAP, complicated by DM 2, muscle cramps NPSG was prior to 2009- not in EMR  --------------------------------------------------------------------------   04/04/2019- 59 year old male former smoker followed for OSA/CPAP, complicated by DM 2, muscle cramps CPAP 14/Advanced Quit smoking again recently and has met with Eric Form about low dose CT cancer screening program -----OSA on CPAP, DME: [employer]; pt states he's likely due for new supplies  Body weight today 258 lbs Download compliance 100%, AHI 3.1/ hr He has quit smoking again. We reviewed his CT report w 2 mm nodule. Breathing is comfortable. Continues pleased with CPAP but needs mask and supplies. Reviewed download. CT chest Low Dose cancer screen 03/26/2019-  IMPRESSION: 1. Lung-RADS 2, benign appearance or behavior. Continue annual screening with low-dose chest CT without contrast in 12 months. 2.  Aortic Atherosclerois (ICD10-170.0)  04/06/20- 59 year old male former smoker followed for OSA/CPAP, Insomnia,, complicated by DM 2, muscle cramps, GERD, Morbid Obesity,  CPAP 14/[employer] Download- compliance 100%, AHI 2.5/ hr Body weight today- 259 lbs Covid vax- 2 Phizer Flu vax-had Machine replaced 1 year ago. Doing well. Pending follow-up CT low dose screening exam- annual.   ROS- see HPI   + sign = positive Constitutional:   No-   weight loss, night sweats, fevers, chills,  fatigue, lassitude. HEENT:   No-  headaches, difficulty swallowing, tooth/dental problems, sore throat,       No-  sneezing, itching, ear ache, nasal congestion, post nasal drip,  CV:  No-   chest pain, orthopnea, PND, swelling in lower extremities, anasarca, dizziness, palpitations Resp: No-   shortness of breath with exertion or at rest.              No-   productive cough,  No non-productive cough,  No-  coughing up of blood.              No-   change in color of mucus.  No- wheezing.    Skin: No-   rash or lesions. GI:  No-   heartburn, indigestion, abdominal pain, nausea, vomiting,  GU:  MS:  No-   joint pain or swelling.  . Neuro- nothing unusual  Psych:  No- change in mood or affect. No depression or anxiety.  No memory loss.  OBJ- General- Alert, Oriented, Affect-appropriate, calm, Distress- none acute,  + obese Skin- rash-none, lesions- none, excoriation- none Lymphadenopathy- none Head- atraumatic            Eyes- Gross vision intact, PERRLA, conjunctivae clear secretions            Ears- Hearing, canals-normal            Nose- Clear, no-Septal dev, mucus, polyps, erosion, perforation             Throat- Mallampati III , mucosa clear , drainage- none, tonsils- atrophic Neck- flexible , trachea midline, no stridor , thyroid nl, carotid no bruit Chest - symmetrical excursion , unlabored           Heart/CV- RRR , no murmur , no gallop  , no rub, nl s1 s2                           - JVD- none , edema- none, stasis changes- none, varices- none           Lung- clear to P&A, wheeze- none, cough- none , dullness-none, rub- none  Chest wall-  Abd-  Br/ Gen/ Rectal- Not done, not indicated Extrem- cyanosis- none, clubbing, none, atrophy- none, strength- nl Neuro- grossly intact to observation

## 2020-04-07 NOTE — Assessment & Plan Note (Signed)
Benefits from CPAP with good compliance and control Plan- continue fixed CPAP 14

## 2020-04-07 NOTE — Assessment & Plan Note (Signed)
Continues low dose screening CT program and remains off tobacco products

## 2020-04-07 NOTE — Assessment & Plan Note (Signed)
Ok to use otc sleep aid occasionally.  Attention to good sleep hygiene

## 2020-04-16 ENCOUNTER — Ambulatory Visit
Admission: RE | Admit: 2020-04-16 | Discharge: 2020-04-16 | Disposition: A | Discharge status: Home / Self Care | Payer: BC Managed Care – PPO | Source: Ambulatory Visit | Attending: Acute Care | Admitting: Acute Care

## 2020-04-16 DIAGNOSIS — D7389 Other diseases of spleen: Secondary | ICD-10-CM | POA: Diagnosis not present

## 2020-04-16 DIAGNOSIS — G4733 Obstructive sleep apnea (adult) (pediatric): Secondary | ICD-10-CM | POA: Diagnosis not present

## 2020-04-16 DIAGNOSIS — J439 Emphysema, unspecified: Secondary | ICD-10-CM | POA: Diagnosis not present

## 2020-04-16 DIAGNOSIS — F17210 Nicotine dependence, cigarettes, uncomplicated: Secondary | ICD-10-CM

## 2020-04-16 DIAGNOSIS — I2510 Atherosclerotic heart disease of native coronary artery without angina pectoris: Secondary | ICD-10-CM | POA: Diagnosis not present

## 2020-04-17 NOTE — Progress Notes (Signed)

## 2020-04-19 DIAGNOSIS — G4733 Obstructive sleep apnea (adult) (pediatric): Secondary | ICD-10-CM | POA: Diagnosis not present

## 2020-04-20 ENCOUNTER — Encounter: Payer: Self-pay | Admitting: Family Medicine

## 2020-04-23 ENCOUNTER — Other Ambulatory Visit: Payer: Self-pay | Admitting: *Deleted

## 2020-04-23 DIAGNOSIS — Z87891 Personal history of nicotine dependence: Secondary | ICD-10-CM

## 2020-04-24 ENCOUNTER — Other Ambulatory Visit: Payer: Self-pay | Admitting: Family Medicine

## 2020-04-24 NOTE — Telephone Encounter (Signed)
Pharmacy requests refill on: OneTouch Verio Test Strip  LAST REFILL: 04/15/2019 LAST OV: 02/20/2020 NEXT OV: Not Scheduled PHARMACY: [facility] [place], [place]

## 2020-04-29 ENCOUNTER — Telehealth: Payer: Self-pay

## 2020-04-29 MED ORDER — ONETOUCH VERIO VI STRP: ORAL_STRIP | 3 refills | Status: DC

## 2020-04-29 NOTE — Telephone Encounter (Signed)
Prior auth request for onttouch verio strip

## 2020-08-07 ENCOUNTER — Telehealth: Payer: Self-pay | Admitting: Family Medicine

## 2020-08-07 NOTE — Telephone Encounter (Signed)
Refill request for Valium 5 mg tablets  LOV- 02/20/20 Next OV - not scheduled Last refilled - 02/20/20 #30/0

## 2020-08-08 NOTE — Telephone Encounter (Signed)
Prescription sent.  Needs diabetes follow-up with A1c at the visit when possible this spring.  Does not need to fast for the visit.  Please schedule.  Thanks.

## 2020-08-10 NOTE — Telephone Encounter (Signed)
I have scheduled for 4/14 at 3pm

## 2020-08-21 ENCOUNTER — Other Ambulatory Visit: Payer: Self-pay | Admitting: Family Medicine

## 2020-08-24 NOTE — Telephone Encounter (Signed)
Pharmacy requests refill on: Lantus Solostar 100 unit/mL  LAST REFILL: 02/20/2020  LAST OV: 02/20/2020 NEXT OV: 10/01/2020 PHARMACY: [id number] [place], [place] requests refill on: Hydrocortisone Suppositories 25 mg   LAST REFILL: 02/26/2020 (Q-12, R-2) LAST OV: 02/20/2020 NEXT OV: 10/01/2020 PHARMACY: [facility] [place], [place]

## 2020-08-30 NOTE — Telephone Encounter (Signed)
Sent. Thanks.   

## 2020-09-23 ENCOUNTER — Telehealth: Payer: Self-pay

## 2020-09-23 NOTE — Telephone Encounter (Signed)
PA also started for OneTouch Verio strips in [email or web address]. Key: [id number]. Will await determination.

## 2020-09-23 NOTE — Telephone Encounter (Signed)
PA needed for Lantus. PA has been started in Covermymeds.com; Key: [id number]. Awaiting determination.

## 2020-09-24 NOTE — Telephone Encounter (Signed)
Both Pa's were denied in covermymeds. Awaiting denial paperwork for reason.

## 2020-09-25 NOTE — Telephone Encounter (Signed)
Denial papers received for onetouch verio strips; patient will need to use [employer] next glucose supplies.  Denial papers received for Lantus; Patient must try [id number]-[id number] first. Please advise on changes.

## 2020-09-27 MED ORDER — SEMGLEE 100 UNIT/ML ~~LOC~~ SOPN: PEN_INJECTOR | SUBCUTANEOUS | 3 refills | Status: DC

## 2020-09-27 NOTE — Telephone Encounter (Signed)
I changed the insulin pen and sent that prescription.  Please send prescription for [employer] Next glucose supplies.  Please update patient.  Thanks.

## 2020-09-27 NOTE — Addendum Note (Signed)
Addended by: Tonia Ghent on: 09/27/2020 09:21 PM   Modules accepted: Orders

## 2020-09-28 MED ORDER — CONTOUR NEXT ONE DEVI: 1.0000 | 0 refills | Status: AC | PRN

## 2020-09-28 MED ORDER — CONTOUR NEXT TEST VI STRP: ORAL_STRIP | 12 refills | Status: DC

## 2020-09-28 NOTE — Telephone Encounter (Signed)
LMTCB to discuss rxs; rx for glucose supplies have been sent.

## 2020-09-28 NOTE — Addendum Note (Signed)
Addended by: Sherrilee Gilles B on: 09/28/2020 04:38 PM   Modules accepted: Orders

## 2020-09-29 NOTE — Telephone Encounter (Signed)
Spoke with patient about his medication changes and that rxs are at the pharmacy.

## 2020-10-01 ENCOUNTER — Ambulatory Visit: Payer: BC Managed Care – PPO | Admitting: Family Medicine

## 2020-10-01 ENCOUNTER — Other Ambulatory Visit: Payer: Self-pay

## 2020-10-01 ENCOUNTER — Encounter: Payer: Self-pay | Admitting: Family Medicine

## 2020-10-01 VITALS — BP 120/72 | HR 81 | Temp 97.5°F | Ht 72.0 in | Wt 256.0 lb

## 2020-10-01 DIAGNOSIS — E119 Type 2 diabetes mellitus without complications: Secondary | ICD-10-CM | POA: Diagnosis not present

## 2020-10-01 DIAGNOSIS — Z91018 Allergy to other foods: Secondary | ICD-10-CM

## 2020-10-01 DIAGNOSIS — Z23 Encounter for immunization: Secondary | ICD-10-CM

## 2020-10-01 LAB — POCT GLYCOSYLATED HEMOGLOBIN (HGB A1C): Hemoglobin A1C: 7.7 % — AB (ref 4.0–5.6)

## 2020-10-01 MED ORDER — ATORVASTATIN CALCIUM 80 MG PO TABS: 1.0000 | ORAL_TABLET | Freq: Every day | ORAL | Status: DC

## 2020-10-01 MED ORDER — SEMGLEE 100 UNIT/ML ~~LOC~~ SOPN: PEN_INJECTOR | SUBCUTANEOUS | Status: DC

## 2020-10-01 NOTE — Progress Notes (Signed)
This visit occurred during the SARS-CoV-2 public health emergency.  Safety protocols were in place, including screening questions prior to the visit, additional usage of staff PPE, and extensive cleaning of exam room while observing appropriate contact time as indicated for disinfecting solutions.  Diabetes:  Using medications without difficulties: yes Hypoglycemic episodes: down to 80 or 90 rarely, cautions d/w pt.   Hyperglycemic episodes: no Feet problems: no Blood Sugars averaging: ~140-160 eye exam within last year: due this summer.   He is getting ready to change to semglee, routine cations d/w pt.   He needed higher insulin dose with current insulin.   Metformin BID.    Alpha gal d/w pt.  He isn't having hives.  He had barbeque w/o hives.  Then had hamburger w/o sx.  Cautions d/w pt.  He has epipen if needed, with allergy clinic f/u TBD.  He had some episodic diarrhea, unclear if related to animal protein intake.  No blood in stool.  I told mom to check on this.  See following phone note.  PMH and SH reviewed  Meds, vitals, and allergies reviewed.   ROS: Per HPI unless specifically indicated in ROS section   GEN: nad, alert and oriented HEENT: ncat NECK: supple w/o LA CV: rrr. PULM: ctab, no inc wob ABD: soft, +bs EXT: no edema SKIN: well perfused.

## 2020-10-01 NOTE — Patient Instructions (Addendum)
Let me know if you have trouble with your sugar on the new insulin.  Tetanus shot today.  Take care.  Glad to see you. Keep the appointment for September here.

## 2020-10-04 ENCOUNTER — Telehealth: Payer: Self-pay | Admitting: Family Medicine

## 2020-10-04 NOTE — Assessment & Plan Note (Signed)
He is going to change over to new insulin in the near future.  I think it makes sense to get that done first and then see how he does.  Continue work on diet and exercise.  He is going to recheck here in a few months anyway, as scheduled.  If sugars not improving or if worse in the meantime then he can let me know.  Routine cautions given to patient.  He agrees with plan.

## 2020-10-04 NOTE — Assessment & Plan Note (Signed)
See following phone note. 

## 2020-10-04 NOTE — Telephone Encounter (Signed)
Please update patient.  I did some checking in the meantime.  There are apparently some occasional/rare patients with a history of alpha gal who have GI symptoms upon exposure to mammalian meat.  If he continues to have trouble then please let me know.  Thanks.

## 2020-10-06 NOTE — Telephone Encounter (Signed)
Called and spoke with patient and gave him Dr. Josefine Class message. Patient verbalized understanding.

## 2021-01-22 ENCOUNTER — Other Ambulatory Visit: Payer: Self-pay | Admitting: Family Medicine

## 2021-02-03 ENCOUNTER — Other Ambulatory Visit: Payer: Self-pay | Admitting: Family Medicine

## 2021-02-03 DIAGNOSIS — E119 Type 2 diabetes mellitus without complications: Secondary | ICD-10-CM

## 2021-02-03 DIAGNOSIS — Z125 Encounter for screening for malignant neoplasm of prostate: Secondary | ICD-10-CM

## 2021-02-15 ENCOUNTER — Telehealth: Payer: Self-pay

## 2021-02-15 ENCOUNTER — Other Ambulatory Visit: Payer: Self-pay | Admitting: Family Medicine

## 2021-02-15 NOTE — Telephone Encounter (Signed)
Noted. Thanks.

## 2021-02-15 NOTE — Telephone Encounter (Signed)
[facility]. Insurance no longer covers one that was sent in last.

## 2021-02-15 NOTE — Telephone Encounter (Signed)
Patient Returned call pharmacy called back and were able to get that approved with insurance. No action needed at this time.

## 2021-02-19 ENCOUNTER — Other Ambulatory Visit: Payer: Self-pay

## 2021-02-19 ENCOUNTER — Other Ambulatory Visit (INDEPENDENT_AMBULATORY_CARE_PROVIDER_SITE_OTHER): Payer: BC Managed Care – PPO

## 2021-02-19 DIAGNOSIS — Z125 Encounter for screening for malignant neoplasm of prostate: Secondary | ICD-10-CM

## 2021-02-19 DIAGNOSIS — E119 Type 2 diabetes mellitus without complications: Secondary | ICD-10-CM

## 2021-02-19 LAB — MICROALBUMIN / CREATININE URINE RATIO
Creatinine,U: 199.1 mg/dL
Microalb Creat Ratio: 1.3 mg/g (ref 0.0–30.0)
Microalb, Ur: 2.7 mg/dL — ABNORMAL HIGH (ref 0.0–1.9)

## 2021-02-19 LAB — COMPREHENSIVE METABOLIC PANEL
ALT: 33 U/L (ref 0–53)
AST: 27 U/L (ref 0–37)
Albumin: 4.2 g/dL (ref 3.5–5.2)
Alkaline Phosphatase: 67 U/L (ref 39–117)
BUN: 17 mg/dL (ref 6–23)
CO2: 28 mEq/L (ref 19–32)
Calcium: 9.3 mg/dL (ref 8.4–10.5)
Chloride: 104 mEq/L (ref 96–112)
Creatinine, Ser: 1.03 mg/dL (ref 0.40–1.50)
GFR: 78.1 mL/min (ref >60.00)
Glucose, Bld: 132 mg/dL — ABNORMAL HIGH (ref 70–99)
Potassium: 3.9 mEq/L (ref 3.5–5.1)
Sodium: 141 mEq/L (ref 135–145)
Total Bilirubin: 0.7 mg/dL (ref 0.2–1.2)
Total Protein: 6.9 g/dL (ref 6.0–8.3)

## 2021-02-19 LAB — LIPID PANEL
Cholesterol: 90 mg/dL (ref 0–200)
HDL: 21.4 mg/dL — ABNORMAL LOW (ref >39.00)
LDL Cholesterol: 51 mg/dL (ref 0–99)
NonHDL: 68.27
Total CHOL/HDL Ratio: 4
Triglycerides: 84 mg/dL (ref 0.0–149.0)
VLDL: 16.8 mg/dL (ref 0.0–40.0)

## 2021-02-19 LAB — HEMOGLOBIN A1C: Hgb A1c MFr Bld: 8.6 % — ABNORMAL HIGH (ref 4.6–6.5)

## 2021-02-19 LAB — PSA: PSA: 0.48 ng/mL (ref 0.10–4.00)

## 2021-02-26 ENCOUNTER — Encounter: Payer: BC Managed Care – PPO | Admitting: Family Medicine

## 2021-03-02 ENCOUNTER — Other Ambulatory Visit: Payer: Self-pay | Admitting: Family Medicine

## 2021-03-16 ENCOUNTER — Encounter: Payer: Self-pay | Admitting: Family Medicine

## 2021-03-16 ENCOUNTER — Other Ambulatory Visit: Payer: Self-pay

## 2021-03-16 ENCOUNTER — Ambulatory Visit (INDEPENDENT_AMBULATORY_CARE_PROVIDER_SITE_OTHER): Payer: BC Managed Care – PPO | Admitting: Family Medicine

## 2021-03-16 VITALS — BP 142/80 | HR 80 | Temp 97.8°F | Ht 72.0 in | Wt 255.0 lb

## 2021-03-16 DIAGNOSIS — M549 Dorsalgia, unspecified: Secondary | ICD-10-CM

## 2021-03-16 DIAGNOSIS — E1129 Type 2 diabetes mellitus with other diabetic kidney complication: Secondary | ICD-10-CM

## 2021-03-16 DIAGNOSIS — E785 Hyperlipidemia, unspecified: Secondary | ICD-10-CM

## 2021-03-16 DIAGNOSIS — F5101 Primary insomnia: Secondary | ICD-10-CM

## 2021-03-16 DIAGNOSIS — Z0000 Encounter for general adult medical examination without abnormal findings: Secondary | ICD-10-CM | POA: Diagnosis not present

## 2021-03-16 DIAGNOSIS — Z91018 Allergy to other foods: Secondary | ICD-10-CM

## 2021-03-16 DIAGNOSIS — R1310 Dysphagia, unspecified: Secondary | ICD-10-CM

## 2021-03-16 DIAGNOSIS — R809 Proteinuria, unspecified: Secondary | ICD-10-CM

## 2021-03-16 DIAGNOSIS — G4733 Obstructive sleep apnea (adult) (pediatric): Secondary | ICD-10-CM

## 2021-03-16 DIAGNOSIS — R6889 Other general symptoms and signs: Secondary | ICD-10-CM

## 2021-03-16 DIAGNOSIS — Z23 Encounter for immunization: Secondary | ICD-10-CM | POA: Diagnosis not present

## 2021-03-16 DIAGNOSIS — Z7189 Other specified counseling: Secondary | ICD-10-CM

## 2021-03-16 DIAGNOSIS — M79644 Pain in right finger(s): Secondary | ICD-10-CM

## 2021-03-16 MED ORDER — ZALEPLON 5 MG PO CAPS: 5.0000 mg | ORAL_CAPSULE | Freq: Every evening | ORAL | 5 refills | Status: DC | PRN

## 2021-03-16 MED ORDER — TRAMADOL HCL 50 MG PO TABS: 50.0000 mg | ORAL_TABLET | Freq: Three times a day (TID) | ORAL | 1 refills | Status: DC | PRN

## 2021-03-16 MED ORDER — METFORMIN HCL 500 MG PO TABS: ORAL_TABLET | ORAL | 12 refills | Status: DC

## 2021-03-16 MED ORDER — ATORVASTATIN CALCIUM 80 MG PO TABS: 40.0000 mg | ORAL_TABLET | Freq: Every day | ORAL | Status: DC

## 2021-03-16 MED ORDER — OMEPRAZOLE 40 MG PO CPDR: DELAYED_RELEASE_CAPSULE | ORAL | 12 refills | Status: DC

## 2021-03-16 MED ORDER — DIAZEPAM 5 MG PO TABS: 5.0000 mg | ORAL_TABLET | Freq: Three times a day (TID) | ORAL | 0 refills | Status: DC | PRN

## 2021-03-16 MED ORDER — SEMGLEE 100 UNIT/ML ~~LOC~~ SOPN: PEN_INJECTOR | SUBCUTANEOUS | 12 refills | Status: DC

## 2021-03-16 NOTE — Patient Instructions (Addendum)
Call the GI clinic if they don't call you by 04/2021.   Take care.  Glad to see you. Let me know if you have trouble getting an appointment with GI or the lung cancer screening program.   Call about an eye exam when possible.    Try cutting lipitor in half and see if the aches get better.  Update me in about 2 weeks.  Let me see about options in the meantime.

## 2021-03-16 NOTE — Progress Notes (Signed)
This visit occurred during the SARS-CoV-2 public health emergency.  Safety protocols were in place, including screening questions prior to the visit, additional usage of staff PPE, and extensive cleaning of exam room while observing appropriate contact time as indicated for disinfecting solutions.  CPE- See plan.  Routine anticipatory guidance given to patient.  See health maintenance.  The possibility exists that previously documented standard health maintenance information may have been brought forward from a previous encounter into this note.  If needed, that same information has been updated to reflect the current situation based on today's encounter.  Tetanus 2022 Flu 2022 PNA 2014 Shingles d/w pt.    covid vaccine prev don.   Colon cancer screening- colonoscopy 2019- letter given to patient 2022.   PSA 2022 Living will d/w pt. Would have his wife designated if patient were incapacitated.    Diet and exercise d/w pt.  Working on both.   Lung cancer screening d/w pt.  he should get a call abou that.  He'll update me as needed.    See after visit summary.  Diabetes:  Using medications without difficulties: yes Hypoglycemic episodes: no Hyperglycemic episodes:no Feet problems: some foot tingling at night.   Blood Sugars averaging: ~150s eye exam within last year: due, d/w pt.    Elevated Cholesterol: Using medications without problems: yes Muscle aches: some cramps at night in the legs.   Diet compliance: d/w pt.  Exercise: d/w pt.    No CP with walking but his exertional is lower than prev.  Discussed with patient about referral to crads.    Dysphagia.  Prev EGD done.  Noted with drinking water.  S/p prev dilation back in 2019.    Still using CPAP.  Compliant.    Sonata used prn for insomnia.  Not used every night.  If used early in the night, it helps.    Used a combination of tramadol and valium for spasms in his back w/o ADE.  He is still stretching as needed.  Not drowsy  with med use.    Alpha gal d/w pt.  He has f/u pending.  He was able to eat some mammalian meat w/o hives.    He has R 1st-3rd finger pain.  Joint aches but not tingling in the hands.  Some finger stiffness.    PMH and SH reviewed  Meds, vitals, and allergies reviewed.   ROS: Per HPI.  Unless specifically indicated otherwise in HPI, the patient denies:  General: fever. Eyes: acute vision changes ENT: sore throat Cardiovascular: chest pain Respiratory: SOB GI: vomiting GU: dysuria Musculoskeletal: acute back pain Derm: acute rash Neuro: acute motor dysfunction Psych: worsening mood Endocrine: polydipsia Heme: bleeding Allergy: hayfever  GEN: nad, alert and oriented HEENT: ncat NECK: supple w/o LA CV: rrr. PULM: ctab, no inc wob ABD: soft, +bs EXT: no edema SKIN: no acute rash He has chronic appearing joint changes especially on the right second finger at the PIP and DIP.  No erythema.  Diabetic foot exam: Normal inspection No skin breakdown Small calluses B 1st toes without ulceration or skin breakdown. Normal DP pulses Normal sensation to light touch and monofilament Nails normal

## 2021-03-18 ENCOUNTER — Telehealth: Payer: Self-pay | Admitting: Family Medicine

## 2021-03-18 DIAGNOSIS — R6889 Other general symptoms and signs: Secondary | ICD-10-CM | POA: Insufficient documentation

## 2021-03-18 MED ORDER — OZEMPIC (0.25 OR 0.5 MG/DOSE) 2 MG/1.5ML ~~LOC~~ SOPN: PEN_INJECTOR | SUBCUTANEOUS | 12 refills | Status: DC

## 2021-03-18 NOTE — Assessment & Plan Note (Signed)
He is having cramping in his legs at night.  Discussed temporarily holding statin and he will update me about his situation.

## 2021-03-18 NOTE — Assessment & Plan Note (Signed)
Continue CPAP use.  Compliant.

## 2021-03-18 NOTE — Assessment & Plan Note (Signed)
Alpha gal d/w pt.  He has f/u pending.  He was able to eat some mammalian meat w/o hives.

## 2021-03-18 NOTE — Assessment & Plan Note (Signed)
He will call about follow-up with GI.  Letter given to patient with contact information.

## 2021-03-18 NOTE — Assessment & Plan Note (Signed)
Likely from osteoarthritic changes but we will see if this improves at all with temporarily holding statin.

## 2021-03-18 NOTE — Assessment & Plan Note (Signed)
Labs discussed with patient.  We can add on ACE inhibitor later to address his microalbuminuria but in the meantime we need to address his A1c.  Would add on Ozempic.  See following phone note.

## 2021-03-18 NOTE — Assessment & Plan Note (Signed)
With occ flares of pain.   Used a combination of tramadol and valium for spasms in his back w/o ADE.  He is still stretching as needed.  Not drowsy with med use.   Continue prn use.

## 2021-03-18 NOTE — Telephone Encounter (Signed)
Advised patient to add ozempic injections weekly to current medications. Patient knows how to do the injections and has been advised on medication directions. Advised patient to call back if he has any issues or if med is too expensive to pick up.

## 2021-03-18 NOTE — Assessment & Plan Note (Signed)
Decreased exercise tolerance without chest pain.  Given his overall situation I think it makes sense to see cardiology.  Referred.  Routine cautions given to patient.

## 2021-03-18 NOTE — Assessment & Plan Note (Signed)
Living will d/w pt.   Would have his wife designated if patient were incapacitated.  

## 2021-03-18 NOTE — Telephone Encounter (Signed)
Call patient.  I would add on Ozempic weekly.  Subcutaneous injection.  If he needs help with coaching then let us know and we should be able to set this up.  Add this onto his current medications.  If he has any trouble with low sugars or abdominal pain then let me know.  Start with 0.25 mg once weekly for 4 weeks, then increase to 0.5 mg once weekly.  I sent the prescription.  If it is exceedingly expensive for patient then have him update [place].  Thanks.

## 2021-03-18 NOTE — Assessment & Plan Note (Signed)
Sonata used prn for insomnia.  Not used every night.  If used early in the night, it helps.   Continue as needed use.

## 2021-03-18 NOTE — Assessment & Plan Note (Signed)
Tetanus 2022 Flu 2022 PNA 2014 Shingles d/w pt.    covid vaccine prev don.   Colon cancer screening- colonoscopy 2019- letter given to patient 2022.   PSA 2022 Living will d/w pt. Would have his wife designated if patient were incapacitated.    Diet and exercise d/w pt.  Working on both.   Lung cancer screening d/w pt.  he should get a call abou that.  He'll update me as needed.

## 2021-03-19 DIAGNOSIS — Z91018 Allergy to other foods: Secondary | ICD-10-CM | POA: Diagnosis not present

## 2021-04-20 ENCOUNTER — Encounter: Payer: Self-pay | Admitting: Internal Medicine

## 2021-04-21 NOTE — Progress Notes (Signed)
HPI-  male former smoker followed for obstructive sleep apnea/CPAP, complicated by DM 2, muscle cramps NPSG was prior to 2009- not in EMR  --------------------------------------------------------------------------   04/06/20- 60 year old male former smoker followed for OSA/CPAP, Insomnia,, complicated by DM 2, muscle cramps, GERD, Morbid Obesity,  CPAP 14/[employer] Download- compliance 100%, AHI 2.5/ hr Body weight today- 259 lbs Covid vax- 2 Phizer Flu vax-had Machine replaced 1 year ago. Doing well. Pending follow-up CT low dose screening exam- annual.  04/22/21- 60 year old male former smoker followed for OSA/CPAP, Insomnia,, complicated by DM 2, muscle cramps, GERD, Morbid Obesity, Covid infection 2020,  CPAP 14/[employer] Download- compliance 90%, AHI 1.7/ hr Body weight today- 259 lbs Covid vax- 2 Phizer Flu vax-had Download reviewed.  Machine is about 62 years old and mechanically working well. Since COVID infection in 2020 reports tiring more easily with some awareness of shortness of breath/dyspnea on exertion.  Had smoked up to a pack and 1/2/day until quitting in 2019.  No significant cough or wheeze. CT chest low dose screen 04/16/20- IMPRESSION: Lung-RADS 2, benign appearance or behavior. Continue annual screening with low-dose chest CT without contrast in 12 months. Aortic Atherosclerosis (ICD10-I70.0) and Emphysema (ICD10-J43.9  ROS- see HPI   + sign = positive Constitutional:   No-   weight loss, night sweats, fevers, chills,  fatigue, lassitude. HEENT:   No-  headaches, difficulty swallowing, tooth/dental problems, sore throat,       No-  sneezing, itching, ear ache, nasal congestion, post nasal drip,  CV:  No-   chest pain, orthopnea, PND, swelling in lower extremities, anasarca, dizziness, palpitations Resp: + shortness of breath with exertion or at rest.              No-   productive cough,  No non-productive cough,  No-  coughing up of blood.              No-   change  in color of mucus.  No- wheezing.   Skin: No-   rash or lesions. GI:  No-   heartburn, indigestion, abdominal pain, nausea, vomiting,  GU:  MS:  No-   joint pain or swelling.  . Neuro- nothing unusual  Psych:  No- change in mood or affect. No depression or anxiety.  No memory loss.  OBJ- General- Alert, Oriented, Affect-appropriate, calm, Distress- none acute,  + obese Skin- rash-none, lesions- none, excoriation- none Lymphadenopathy- none Head- atraumatic            Eyes- Gross vision intact, PERRLA, conjunctivae clear secretions            Ears- Hearing, canals-normal            Nose- Clear, no-Septal dev, mucus, polyps, erosion, perforation             Throat- Mallampati III , mucosa clear , drainage- none, tonsils- atrophic Neck- flexible , trachea midline, no stridor , thyroid nl, carotid no bruit Chest - symmetrical excursion , unlabored           Heart/CV- RRR , no murmur , no gallop  , no rub, nl s1 s2                           - JVD- none , edema- none, stasis changes- none, varices- none           Lung- clear to P&A, wheeze- none, cough- none , dullness-none, rub- none  Chest wall-  Abd-  Br/ Gen/ Rectal- Not done, not indicated Extrem- cyanosis- none, clubbing, none, atrophy- none, strength- nl Neuro- grossly intact to observation

## 2021-04-22 ENCOUNTER — Encounter: Payer: Self-pay | Admitting: Internal Medicine

## 2021-04-22 ENCOUNTER — Other Ambulatory Visit: Payer: Self-pay

## 2021-04-22 ENCOUNTER — Ambulatory Visit: Payer: BC Managed Care – PPO | Admitting: Internal Medicine

## 2021-04-22 DIAGNOSIS — G4733 Obstructive sleep apnea (adult) (pediatric): Secondary | ICD-10-CM

## 2021-04-22 NOTE — Patient Instructions (Signed)
We can continue CPAP 14  If you don't hear in the next week or so about scheduling your screening chest CT scan, call to check.  I hope the cardiology visit is useful  Please call if we can help

## 2021-04-27 DIAGNOSIS — H524 Presbyopia: Secondary | ICD-10-CM | POA: Diagnosis not present

## 2021-04-27 DIAGNOSIS — Z7984 Long term (current) use of oral hypoglycemic drugs: Secondary | ICD-10-CM | POA: Diagnosis not present

## 2021-04-27 DIAGNOSIS — E119 Type 2 diabetes mellitus without complications: Secondary | ICD-10-CM | POA: Diagnosis not present

## 2021-04-27 DIAGNOSIS — H5213 Myopia, bilateral: Secondary | ICD-10-CM | POA: Diagnosis not present

## 2021-04-28 ENCOUNTER — Other Ambulatory Visit: Payer: Self-pay | Admitting: Family Medicine

## 2021-05-07 ENCOUNTER — Ambulatory Visit: Payer: BC Managed Care – PPO | Admitting: Interventional Cardiology

## 2021-05-07 ENCOUNTER — Encounter: Payer: Self-pay | Admitting: Interventional Cardiology

## 2021-05-07 ENCOUNTER — Other Ambulatory Visit: Payer: Self-pay

## 2021-05-07 VITALS — BP 146/62 | HR 93 | Ht 72.0 in | Wt 260.6 lb

## 2021-05-07 DIAGNOSIS — I2584 Coronary atherosclerosis due to calcified coronary lesion: Secondary | ICD-10-CM

## 2021-05-07 DIAGNOSIS — E1159 Type 2 diabetes mellitus with other circulatory complications: Secondary | ICD-10-CM | POA: Diagnosis not present

## 2021-05-07 DIAGNOSIS — I2510 Atherosclerotic heart disease of native coronary artery without angina pectoris: Secondary | ICD-10-CM | POA: Diagnosis not present

## 2021-05-07 DIAGNOSIS — R072 Precordial pain: Secondary | ICD-10-CM

## 2021-05-07 DIAGNOSIS — R0609 Other forms of dyspnea: Secondary | ICD-10-CM

## 2021-05-07 MED ORDER — METOPROLOL TARTRATE 100 MG PO TABS: ORAL_TABLET | ORAL | 0 refills | Status: DC

## 2021-05-07 NOTE — Patient Instructions (Addendum)
Medication Instructions:  Your physician recommends that you continue on your current medications as directed. Please refer to the Current Medication list given to you today.   *If you need a refill on your cardiac medications before your next appointment, please call your pharmacy*   Lab Work: Lab work to be done today--BMP If you have labs (blood work) drawn today and your tests are completely normal, you will receive your results only by: [place] (if you have MyChart) OR A paper copy in the mail If you have any lab test that is abnormal or we need to change your treatment, we will call you to review the results.   Testing/Procedures: Your physician has requested that you have an echocardiogram. Echocardiography is a painless test that uses sound waves to create images of your heart. It provides your doctor with information about the size and shape of your heart and how well your heart's chambers and valves are working. This procedure takes approximately one hour. There are no restrictions for this procedure.  Your physician has requested that you have cardiac CT. Cardiac computed tomography (CT) is a painless test that uses an x-ray machine to take clear, detailed pictures of your heart. For further information please visit [email or web address]. Please follow instruction sheet as given.     Follow-Up: At [facility], you and your health needs are our priority.  As part of our continuing mission to provide you with exceptional heart care, we have created designated Provider Care Teams.  These Care Teams include your primary Cardiologist (physician) and Advanced Practice Providers (APPs -  Physician Assistants and Nurse Practitioners) who all work together to provide you with the care you need, when you need it.  We recommend signing up for the patient portal called "MyChart".  Sign up information is provided on this After Visit Summary.  MyChart is used to connect with patients  for Virtual Visits (Telemedicine).  Patients are able to view lab/test results, encounter notes, upcoming appointments, etc.  Non-urgent messages can be sent to your provider as well.   To learn more about what you can do with MyChart, go to [email or web address].    Your next appointment:   As needed  The format for your next appointment:   In Person  Provider:   Larae Grooms, MD     Other Instructions     Your cardiac CT will be scheduled at one of the below locations:   [facility] [address] [place], [place] [address] [phone]  [place] [address] [place], [place] [address] [phone]  If scheduled at [facility], please arrive at the [facility] main entrance (entrance A) of [facility] 30 minutes prior to test start time. You can use the FREE valet parking offered at the main entrance (encouraged to control the heart rate for the test) Proceed to the [facility] Radiology Department (first floor) to check-in and test prep.  If scheduled at [facility], please arrive 15 mins early for check-in and test prep.  Please follow these instructions carefully (unless otherwise directed):  Hold all erectile dysfunction medications at least 3 days (72 hrs) prior to test.  On the Night Before the Test: Be sure to Drink plenty of water. Do not consume any caffeinated/decaffeinated beverages or chocolate 12 hours prior to your test. Do not take any antihistamines 12 hours prior to your test.   On the Day of  the Test: Drink plenty of water until 1 hour prior to the test. Do not eat any food 4 hours prior to the test. You may take your regular medications prior to the test.  Take metoprolol (Lopressor) two hours prior to test.        After the Test: Drink plenty of water. After receiving IV contrast, you may experience a mild flushed feeling.  This is normal. On occasion, you may experience a mild rash up to 24 hours after the test. This is not dangerous. If this occurs, you can take Benadryl 25 mg and increase your fluid intake. If you experience trouble breathing, this can be serious. If it is severe call 911 IMMEDIATELY. If it is mild, please call our office. If you take any of these medications: Glipizide/Metformin, Avandament, Glucavance, please do not take 48 hours after completing test unless otherwise instructed.  Please allow 2-4 weeks for scheduling of routine cardiac CTs. Some insurance companies require a pre-authorization which may delay scheduling of this test.   For non-scheduling related questions, please contact the cardiac imaging nurse navigator should you have any questions/concerns: [name], Cardiac Imaging Nurse Navigator Gordy Clement, Cardiac Imaging Nurse Navigator [facility] Direct Office Dial: [phone]   For scheduling needs, including cancellations and rescheduling, please call [place], [phone].

## 2021-05-07 NOTE — Progress Notes (Signed)
Cardiology Office Note   Date:  05/07/2021   ID:  Keenon, Leitzel 18-Apr-1961, MRN [id number]  PCP:  Tonia Ghent, MD    No chief complaint on file.  DOE  [facility] Readings from Last 3 Encounters:  05/07/21 260 lb 9.6 oz (118.2 kg)  04/22/21 259 lb (117.5 kg)  03/16/21 255 lb (115.7 kg)       History of Present Illness: Edward Wright is a 60 y.o. male who is being seen today for the evaluation of DOE at the request of Tonia Ghent, MD.   Since he had COVID in Feb 2021, and since then, he has had some DOE faster than usual.  Former smoker, quit in 2019, 46 pack years history.  Has had some heartburn sx-feels like a discomfort in his chest.  Not always with exertion.  2021 CT: "No evidence of thoracic aortic aneurysm. Mild atherosclerotic calcifications of the aortic arch.   Mild three-vessel coronary atherosclerosis."  Valve surgery in an uncle years ago.    Denies : Dizziness. Leg edema. Nitroglycerin use. Orthopnea. Palpitations. Paroxysmal nocturnal dyspnea.  Syncope.    Past Medical History:  Diagnosis Date   Bell's palsy    L sided with mild persistent lip droop   BPH (benign prostatic hyperplasia) 07/1997   Colon polyps    adenomatous   Diabetes mellitus type II 01/2003   Diverticulosis    GERD (gastroesophageal reflux disease) 11/2002   Heart murmur    history of   Hyperlipemia 04/1989   Internal hemorrhoids    Peptic stricture of esophagus    Sleep apnea    sleep study pos on CPAP   Spermatocele     Past Surgical History:  Procedure Laterality Date   CHOLECYSTECTOMY  01/05/2004   COLONOSCOPY     CYSTECTOMY Right 1990   ganglion wrist   DOPPLER ECHOCARDIOGRAPHY  01/13/2004   normal   hematuria  12/1994   w/u neg (urol)   VASECTOMY  05/26/2006     Current Outpatient Medications  Medication Sig Dispense Refill   atorvastatin (LIPITOR) 80 MG tablet TAKE 1 TABLET BY MOUTH EVERY DAY 30 tablet 5   Blood Glucose Monitoring Suppl  (CONTOUR NEXT ONE) DEVI 1 Device by Does not apply route as needed. Use to check blood sugar twice daily and as directed.  Insulin Dependent.  Dx:  E11.9 1 each 0   clotrimazole (LOTRIMIN) 1 % cream APPLY TOPICALLY 2 (TWO) TIMES DAILY.AS NEEDED     diazepam (VALIUM) 5 MG tablet Take 1 tablet (5 mg total) by mouth 3 (three) times daily as needed. 30 tablet 0   glucose blood (CONTOUR NEXT TEST) test strip Use as instructed 2 (two) times daily as needed to check blood sugar.  Insulin Dependent.  Dx:  E11.9 100 each 12   hydrocortisone (ANUSOL-HC) 25 MG suppository UNWRAP AND PLACE 1 SUPPOSITORY (25 MG TOTAL) RECTALLY 3 (THREE) TIMES DAILY AS NEEDED. 12 suppository 2   insulin glargine (SEMGLEE, YFGN,) 100 UNIT/ML Solostar Pen Inject up to 100 units daily. 45 mL 12   Insulin Pen Needle (B-D UF III MINI PEN NEEDLES) 31G X 5 MM MISC USE DAILY WITH INSULIN PEN 100 each 3   meclizine (ANTIVERT) 25 MG tablet Take 1 tablet (25 mg total) by mouth 3 (three) times daily as needed for dizziness. 90 tablet 1   metFORMIN (GLUCOPHAGE) 500 MG tablet TAKE 2 TABLETS (1,000 MG TOTAL) BY MOUTH 2 (TWO) TIMES DAILY WITH  A MEAL. 120 tablet 12   NON FORMULARY CPAP 13 Advanced, use as directed     omeprazole (PRILOSEC) 40 MG capsule TAKE 1 CAPSULE BY MOUTH EVERY DAY 30 capsule 12   Semaglutide,0.25 or 0.5MG /DOS, (OZEMPIC, 0.25 OR 0.5 MG/DOSE,) 2 MG/1.5ML SOPN 0.25 mg injected subcutaneously once weekly for 4 weeks, then increase to 0.5 mg once weekly 3 mL 12   traMADol (ULTRAM) 50 MG tablet Take 1 tablet (50 mg total) by mouth 3 (three) times daily as needed. 30 tablet 1   zaleplon (SONATA) 5 MG capsule Take 1 capsule (5 mg total) by mouth at bedtime as needed for sleep. 30 capsule 5   No current facility-administered medications for this visit.    Allergies:   Hydrocodone, Jardiance [empagliflozin], and Meat [alpha-gal]    Social History:  The patient  reports that he quit smoking about 2 years ago. His smoking use  included cigarettes. He has a 10.00 pack-year smoking history. He has never used smokeless tobacco. He reports current alcohol use. He reports that he does not use drugs.   Family History:  The patient's family history includes Cancer in his father and another family member; Dementia in his mother; Heart disease in his mother; Hypertension in his mother; Transient ischemic attack in his mother.    ROS:  Please see the history of present illness.   Otherwise, review of systems are positive for .   All other systems are reviewed and negative.    PHYSICAL EXAM: VS:  BP (!) 146/62   Pulse 93   Ht 6' (1.829 m)   Wt 260 lb 9.6 oz (118.2 kg)   SpO2 93%   BMI 35.34 kg/m  , BMI Body mass index is 35.34 kg/m. GEN: Well nourished, well developed, in no acute distress HEENT: normal Neck: no JVD, carotid bruits, or masses Cardiac: RRR; no murmurs, rubs, or gallops,no edema  Respiratory:  clear to auscultation bilaterally, normal work of breathing GI: soft, nontender, nondistended, + BS MS: no deformity or atrophy Skin: warm and dry, no rash Neuro:  Strength and sensation are intact Psych: euthymic mood, full affect   EKG:   The ekg ordered today demonstrates NSR, RBBB   Recent Labs: 02/19/2021: ALT 33; BUN 17; Creatinine, Ser 1.03; Potassium 3.9; Sodium 141   Lipid Panel    Component Value Date/Time   CHOL 90 02/19/2021 0824   TRIG 84.0 02/19/2021 0824   HDL 21.40 (L) 02/19/2021 0824   CHOLHDL 4 02/19/2021 0824   VLDL 16.8 02/19/2021 0824   LDLCALC 51 02/19/2021 0824     Other studies Reviewed: Additional studies/ records that were reviewed today with results demonstrating: Labs reviewed.  Prior CT reviewed.   ASSESSMENT AND PLAN:  DOE: More notable after COVID.   Could be anginal equivalent.  We will check echocardiogram to evaluate for any cardiomyopathy or structural heart disease after COVID.  We will also check for coronary artery disease with CTA.  He has mild coronary  artery calcification noted on his prior CT for lung cancer screening.  Need to see if this could be significant. Elevated BP: BP readings at home are typically well controlled. 976B systolic.  Did take ACE-I in the past for kidney protection, but this was stopped.  Consider restart of lisinopril for renal protection. DM:On insulin. A1C 8.6.  Whole food, plant-based diet will be helpful.  Increase fiber intake. Hyperlipidemia: LDL 51.  Continue high-dose atorvastatin.   Current medicines are reviewed at length with  the patient today.  The patient concerns regarding his medicines were addressed.  The following changes have been made:  No change  Labs/ tests ordered today include:  No orders of the defined types were placed in this encounter.   Recommend 150 minutes/week of aerobic exercise Low fat, low carb, high fiber diet recommended  Disposition:   FU for CTA and echo   Signed, Larae Grooms, MD  05/07/2021 1:53 PM    [facility] [address], [place], [place]  [address] Phone: [phone]; Fax: [phone]

## 2021-05-08 LAB — BASIC METABOLIC PANEL
BUN/Creatinine Ratio: 14 (ref 10–24)
BUN: 16 mg/dL (ref 8–27)
CO2: 27 mmol/L (ref 20–29)
Calcium: 9.2 mg/dL (ref 8.6–10.2)
Chloride: 101 mmol/L (ref 96–106)
Creatinine, Ser: 1.15 mg/dL (ref 0.76–1.27)
Glucose: 162 mg/dL — ABNORMAL HIGH (ref 70–99)
Potassium: 3.9 mmol/L (ref 3.5–5.2)
Sodium: 141 mmol/L (ref 134–144)
eGFR: 72 mL/min/{1.73_m2} (ref >59)

## 2021-05-14 ENCOUNTER — Encounter: Payer: Self-pay | Admitting: Internal Medicine

## 2021-06-02 ENCOUNTER — Telehealth (HOSPITAL_COMMUNITY): Payer: Self-pay | Admitting: Emergency Medicine

## 2021-06-02 DIAGNOSIS — R072 Precordial pain: Secondary | ICD-10-CM

## 2021-06-02 MED ORDER — IVABRADINE HCL 5 MG PO TABS: 10.0000 mg | ORAL_TABLET | Freq: Once | ORAL | 0 refills | Status: AC

## 2021-06-02 NOTE — Telephone Encounter (Signed)
Reaching out to patient to offer assistance regarding upcoming cardiac imaging study; pt verbalizes understanding of appt date/time, parking situation and where to check in, pre-test NPO status and medications ordered, and verified current allergies; name and call back number provided for further questions should they arise Edward Bond RN Navigator Cardiac Imaging [facility] [phone] office [phone] cell  100mg  metoprolol tart (HR 93 so adding 10mg  ivabradine-sending to pharm on file) Denies iv issues Arrival 2:00p

## 2021-06-04 ENCOUNTER — Other Ambulatory Visit: Payer: Self-pay

## 2021-06-04 ENCOUNTER — Ambulatory Visit (HOSPITAL_COMMUNITY)
Admission: RE | Admit: 2021-06-04 | Discharge: 2021-06-04 | Disposition: A | Discharge status: Home / Self Care | Payer: BC Managed Care – PPO | Source: Ambulatory Visit | Attending: Interventional Cardiology | Admitting: Interventional Cardiology

## 2021-06-04 DIAGNOSIS — R072 Precordial pain: Secondary | ICD-10-CM | POA: Diagnosis not present

## 2021-06-04 MED ORDER — METOPROLOL TARTRATE 5 MG/5ML IV SOLN
INTRAVENOUS | Status: AC
  Filled 2021-06-04: qty 5

## 2021-06-04 MED ORDER — METOPROLOL TARTRATE 5 MG/5ML IV SOLN: 5.0000 mg | INTRAVENOUS | Status: DC | PRN

## 2021-06-04 MED ORDER — DILTIAZEM HCL 25 MG/5ML IV SOLN
INTRAVENOUS | Status: AC
  Filled 2021-06-04: qty 5

## 2021-06-04 MED ORDER — IOHEXOL 350 MG/ML SOLN
100.0000 mL | Freq: Once | INTRAVENOUS | Status: AC | PRN
  Administered 2021-06-04: 100 mL via INTRAVENOUS

## 2021-06-04 MED ORDER — NITROGLYCERIN 0.4 MG SL SUBL
SUBLINGUAL_TABLET | SUBLINGUAL | Status: AC
  Filled 2021-06-04: qty 2

## 2021-06-04 MED ORDER — NITROGLYCERIN 0.4 MG SL SUBL
0.8000 mg | SUBLINGUAL_TABLET | Freq: Once | SUBLINGUAL | Status: AC
  Administered 2021-06-04: 0.8 mg via SUBLINGUAL

## 2021-06-08 ENCOUNTER — Ambulatory Visit (HOSPITAL_COMMUNITY): Payer: BC Managed Care – PPO | Attending: Cardiology

## 2021-06-08 ENCOUNTER — Other Ambulatory Visit: Payer: Self-pay

## 2021-06-08 DIAGNOSIS — E1159 Type 2 diabetes mellitus with other circulatory complications: Secondary | ICD-10-CM | POA: Diagnosis not present

## 2021-06-08 DIAGNOSIS — R0609 Other forms of dyspnea: Secondary | ICD-10-CM | POA: Diagnosis not present

## 2021-06-08 LAB — ECHOCARDIOGRAM COMPLETE
Area-P 1/2: 3.76 cm2
P 1/2 time: 245 msec
S' Lateral: 3.3 cm

## 2021-06-08 MED ORDER — PERFLUTREN LIPID MICROSPHERE
1.0000 mL | INTRAVENOUS | Status: AC | PRN
Start: 2021-06-08 — End: 2021-06-08
  Administered 2021-06-08: 1 mL via INTRAVENOUS

## 2021-06-08 NOTE — Progress Notes (Unsigned)
[id number] °

## 2021-06-09 ENCOUNTER — Other Ambulatory Visit: Payer: Self-pay | Admitting: *Deleted

## 2021-06-09 DIAGNOSIS — E782 Mixed hyperlipidemia: Secondary | ICD-10-CM

## 2021-06-17 ENCOUNTER — Other Ambulatory Visit: Payer: BC Managed Care – PPO

## 2021-06-25 ENCOUNTER — Other Ambulatory Visit: Payer: BC Managed Care – PPO | Admitting: *Deleted

## 2021-06-25 ENCOUNTER — Other Ambulatory Visit: Payer: Self-pay

## 2021-06-25 DIAGNOSIS — E782 Mixed hyperlipidemia: Secondary | ICD-10-CM

## 2021-06-26 LAB — NMR, LIPOPROFILE
Cholesterol, Total: 87 mg/dL — ABNORMAL LOW (ref 100–199)
HDL Particle Number: 18.8 umol/L — ABNORMAL LOW (ref >=30.5)
HDL-C: 19 mg/dL — ABNORMAL LOW (ref >39)
LDL Particle Number: 729 nmol/L (ref <1000)
LDL Size: 20 nm — ABNORMAL LOW (ref >20.5)
LDL-C (NIH Calc): 49 mg/dL (ref 0–99)
LP-IR Score: 51 — ABNORMAL HIGH (ref <=45)
Small LDL Particle Number: 477 nmol/L (ref <=527)
Triglycerides: 93 mg/dL (ref 0–149)

## 2021-06-26 LAB — APOLIPOPROTEIN B: Apolipoprotein B: 51 mg/dL (ref <90)

## 2021-07-02 ENCOUNTER — Telehealth: Payer: Self-pay | Admitting: *Deleted

## 2021-07-02 DIAGNOSIS — E782 Mixed hyperlipidemia: Secondary | ICD-10-CM

## 2021-07-02 MED ORDER — EZETIMIBE 10 MG PO TABS: 10.0000 mg | ORAL_TABLET | Freq: Every day | ORAL | 3 refills | Status: DC

## 2021-07-02 NOTE — Telephone Encounter (Signed)
Patient notified.  Prescription sent to [facility] in [place].  He will come in for fasting lab work on 10/01/21

## 2021-07-02 NOTE — Telephone Encounter (Signed)
-----   Message from Rollen Sox, [facility] sent at 07/01/2021  4:40 PM EST ----- Due to coronary calcium score would target LDL particle # < 550 with LDL goal < 55. Recommend adding on Zetia 10mg  daily

## 2021-07-07 NOTE — Telephone Encounter (Signed)
Lets order the NMR for 6 months from now instead of 3 months.    JV   Patient notified.  He will come in for fasting lab work on 12/31/21

## 2021-07-07 NOTE — Addendum Note (Signed)
Addended by: Thompson Grayer on: 07/07/2021 08:50 AM   Modules accepted: Orders

## 2021-07-23 ENCOUNTER — Other Ambulatory Visit: Payer: Self-pay | Admitting: Family Medicine

## 2021-07-28 ENCOUNTER — Encounter: Payer: Self-pay | Admitting: Internal Medicine

## 2021-07-28 NOTE — Assessment & Plan Note (Signed)
Benefits from CPAP with good compliance and control Plan-continue CPAP 14

## 2021-07-28 NOTE — Assessment & Plan Note (Signed)
Aware of impact of body weight on OSA and other medical problems. Plan-encourage lifestyle change with diet and exercise

## 2021-10-01 ENCOUNTER — Other Ambulatory Visit: Payer: BC Managed Care – PPO

## 2021-10-05 DIAGNOSIS — G4733 Obstructive sleep apnea (adult) (pediatric): Secondary | ICD-10-CM | POA: Diagnosis not present

## 2021-10-06 ENCOUNTER — Encounter: Payer: Self-pay | Admitting: Family Medicine

## 2021-10-06 MED ORDER — BD PEN NEEDLE MINI U/F 31G X 5 MM MISC: 3 refills | Status: DC

## 2021-10-13 ENCOUNTER — Ambulatory Visit: Payer: BC Managed Care – PPO | Admitting: Internal Medicine

## 2021-10-13 ENCOUNTER — Encounter: Payer: Self-pay | Admitting: Internal Medicine

## 2021-10-13 VITALS — BP 142/70 | HR 94 | Ht 72.0 in | Wt 254.0 lb

## 2021-10-13 DIAGNOSIS — R1319 Other dysphagia: Secondary | ICD-10-CM | POA: Diagnosis not present

## 2021-10-13 DIAGNOSIS — Z860100 Personal history of colon polyps, unspecified: Secondary | ICD-10-CM

## 2021-10-13 DIAGNOSIS — K625 Hemorrhage of anus and rectum: Secondary | ICD-10-CM | POA: Diagnosis not present

## 2021-10-13 DIAGNOSIS — K219 Gastro-esophageal reflux disease without esophagitis: Secondary | ICD-10-CM | POA: Diagnosis not present

## 2021-10-13 DIAGNOSIS — K222 Esophageal obstruction: Secondary | ICD-10-CM

## 2021-10-13 MED ORDER — PLENVU 140 G PO SOLR: 1.0000 | Freq: Once | ORAL | 0 refills | Status: AC

## 2021-10-13 NOTE — Patient Instructions (Signed)
If you are age 63 or older, your body mass index should be between 23-30. Your Body mass index is 34.45 kg/m?Marland Kitchen If this is out of the aforementioned range listed, please consider follow up with your Primary Care Provider. ? ?If you are age 65 or younger, your body mass index should be between 19-25. Your Body mass index is 34.45 kg/m?Marland Kitchen If this is out of the aformentioned range listed, please consider follow up with your Primary Care Provider.  ? ?________________________________________________________ ? ?The [facility] providers would like to encourage you to use [facility] to communicate with providers for non-urgent requests or questions.  Due to long hold times on the telephone, sending your provider a message by [facility] may be a faster and more efficient way to get a response.  Please allow 48 business hours for a response.  Please remember that this is for non-urgent requests.  ?_______________________________________________________ ? ?You have been scheduled for an endoscopy and colonoscopy. Please follow the written instructions given to you at your visit today. ?Please pick up your prep supplies at the pharmacy within the next 1-3 days. ?If you use inhalers (even only as needed), please bring them with you on the day of your procedure. ? ?

## 2021-10-13 NOTE — Progress Notes (Signed)
HISTORY OF PRESENT ILLNESS: ? ?Edward Wright is a 61 y.o. male with multiple medical problems as listed below including obesity and insulin requiring diabetes mellitus.  His GI issues include GERD complicated by peptic stricture for which she has undergone esophageal dilation on several previous occasions.  He also has a history of multiple adenomatous colon polyps and sessile serrated polyps.  He presents today regarding problems with dysphagia, hiccups, the desire for repeat endoscopy with dilation, and surveillance colonoscopy.  He also reports some intermittent rectal bleeding about 2 to 3 weeks ago.  This lasted for a day or 2.  No associated rectal discomfort or pain.  No abdominal pain.  He is noted to have internal hemorrhoids.  For his chronic GERD he takes omeprazole.  Previous upper endoscopy in October 2015 in October 2019 or accompanied by [facility] dilation of the esophagus, 86 [place].  He feels like this helped his dysphagia.  Previous colonoscopy examinations were performed in 2015 and 2019.  He now describes some intermittent solid food dysphagia.  He also describes regurgitation and hiccups.  He has been compliant with omeprazole therapy.  He is noted to have multiple adenomas and sessile serrated polyps of his mentioned above.  He is due for follow-up.  He is on insulin, metformin, and Ozempic for his diabetes ? ?REVIEW OF SYSTEMS: ? ?All non-GI ROS negative unless otherwise stated in the HPI except for irregular heartbeat, muscle cramps, shortness of breath, sleeping problems ? ?Past Medical History:  ?Diagnosis Date  ? Bell's palsy   ? L sided with mild persistent lip droop  ? BPH (benign prostatic hyperplasia) 07/1997  ? Colon polyps   ? adenomatous  ? Diabetes mellitus type II 01/2003  ? Diverticulosis   ? GERD (gastroesophageal reflux disease) 11/2002  ? Heart murmur   ? history of  ? Hyperlipemia 04/1989  ? Internal hemorrhoids   ? Peptic stricture of esophagus   ? Sleep apnea   ? sleep  study pos on CPAP  ? Spermatocele   ? ? ?Past Surgical History:  ?Procedure Laterality Date  ? CHOLECYSTECTOMY  01/05/2004  ? COLONOSCOPY    ? CYSTECTOMY Right 1990  ? ganglion wrist  ? DOPPLER ECHOCARDIOGRAPHY  01/13/2004  ? normal  ? hematuria  12/1994  ? w/u neg (urol)  ? VASECTOMY  05/26/2006  ? ? ?Social History ?Edward Wright  reports that he quit smoking about 2 years ago. His smoking use included cigarettes. He has a 10.00 pack-year smoking history. He has never used smokeless tobacco. He reports current alcohol use. He reports that he does not use drugs. ? ?family history includes Cancer in his father and another family member; Dementia in his mother; Diabetes in his paternal grandfather; Heart disease in his mother; Hypertension in his mother; Transient ischemic attack in his mother. ? ?Allergies  ?Allergen Reactions  ? Hydrocodone Nausea Only  ?  Sweating  ? Jardiance [Empagliflozin]   ?  Yeast infections  ? Meat [Alpha-Gal]   ?  Avoid mammalian meat  ? ? ?  ? ?PHYSICAL EXAMINATION: ?Vital signs: Ht 6' (1.829 m)   Wt 254 lb (115.2 kg)   BMI 34.45 kg/m?   ?Constitutional: Pleasant, generally well-appearing, no acute distress ?Psychiatric: alert and oriented x3, cooperative ?Eyes: extraocular movements intact, anicteric, conjunctiva pink ?Mouth: oral pharynx moist, no lesions ?Neck: supple no lymphadenopathy ?Cardiovascular: heart regular rate and rhythm, no murmur ?Lungs: clear to auscultation bilaterally ?Abdomen: soft, obese, nontender, nondistended, no obvious ascites,  no peritoneal signs, normal bowel sounds, no organomegaly ?Rectal: Deferred to colonoscopy ?Extremities: no clubbing, cyanosis, or lower extremity edema bilaterally ?Skin: no lesions on visible extremities ?Neuro: No focal deficits.  Cranial nerves intact ? ?ASSESSMENT: ? ?1.  Rectal bleeding.  Based on description and prior colonoscopic evaluations, suspect internal hemorrhoids. ?2.  History of multiple adenomatous colon polyps and  sessile serrated polyps.  Previous colonoscopy 2015 and 2019.  Due for surveillance ?3.  GERD complicated by esophageal stricture ?4.  Recurrent dysphagia likely secondary to esophageal stricture ?5.  Regurgitation and hiccups.  May have dysmotility. ?6.  Multiple medical problems including diabetes on insulin ? ? ?PLAN: ? ?1.  Colonoscopy to evaluate rectal bleeding and provide colorectal neoplasia surveillance.  The patient is high risk given his comorbidities and body habitus.  The procedures will be performed with monitored anesthesia care.The nature of the procedure, as well as the risks, benefits, and alternatives were carefully and thoroughly reviewed with the patient. Ample time for discussion and questions allowed. The patient understood, was satisfied, and agreed to proceed.  ?2.  Reflux precautions ?3.  Continue PPI ?4.  Schedule upper endoscopy with esophageal dilation.  The patient is high risk as above.  We will perform the procedures concurrently so that he can avoid 2 separate anesthetics.The nature of the procedure, as well as the risks, benefits, and alternatives were carefully and thoroughly reviewed with the patient. Ample time for discussion and questions allowed. The patient understood, was satisfied, and agreed to proceed.  ?5.  Hold diabetic agents the day of the procedure until his examinations are completed to avoid unwanted hypoglycemia ?6.  Ongoing general medical care with PCP ? ? ? ? ?  ?

## 2021-10-19 DIAGNOSIS — Z7984 Long term (current) use of oral hypoglycemic drugs: Secondary | ICD-10-CM | POA: Diagnosis not present

## 2021-10-19 DIAGNOSIS — E119 Type 2 diabetes mellitus without complications: Secondary | ICD-10-CM | POA: Diagnosis not present

## 2021-10-19 DIAGNOSIS — H5213 Myopia, bilateral: Secondary | ICD-10-CM | POA: Diagnosis not present

## 2021-10-19 DIAGNOSIS — H52223 Regular astigmatism, bilateral: Secondary | ICD-10-CM | POA: Diagnosis not present

## 2021-10-19 LAB — HM DIABETES EYE EXAM

## 2021-10-28 ENCOUNTER — Other Ambulatory Visit: Payer: Self-pay | Admitting: Family Medicine

## 2021-10-29 ENCOUNTER — Other Ambulatory Visit: Payer: Self-pay | Admitting: Family Medicine

## 2021-11-03 ENCOUNTER — Other Ambulatory Visit: Payer: Self-pay | Admitting: Family Medicine

## 2021-11-09 ENCOUNTER — Ambulatory Visit: Payer: BC Managed Care – PPO | Admitting: Family Medicine

## 2021-11-09 ENCOUNTER — Encounter: Payer: Self-pay | Admitting: Family Medicine

## 2021-11-09 ENCOUNTER — Telehealth: Payer: Self-pay

## 2021-11-09 VITALS — BP 140/76 | HR 73 | Temp 97.9°F | Ht 72.0 in | Wt 254.4 lb

## 2021-11-09 DIAGNOSIS — J020 Streptococcal pharyngitis: Secondary | ICD-10-CM | POA: Diagnosis not present

## 2021-11-09 DIAGNOSIS — J029 Acute pharyngitis, unspecified: Secondary | ICD-10-CM | POA: Diagnosis not present

## 2021-11-09 LAB — POCT RAPID STREP A (OFFICE): Rapid Strep A Screen: POSITIVE — AB

## 2021-11-09 MED ORDER — PENICILLIN V POTASSIUM 500 MG PO TABS: 500.0000 mg | ORAL_TABLET | Freq: Three times a day (TID) | ORAL | 0 refills | Status: AC

## 2021-11-09 NOTE — Telephone Encounter (Signed)
See note from today

## 2021-11-09 NOTE — Patient Instructions (Signed)
You have strept throat Take Penicillin for 10 days Contagious until 1 day after treatment

## 2021-11-09 NOTE — Telephone Encounter (Signed)
[facility] - Client TELEPHONE ADVICE RECORD [facility] Patient Name: Edward Wright [place] Gender: Male DOB: [birth year] Age: 61 Y 9 M 13 D Return Phone Number: [phone] (Primary), [phone] (Secondary) Address[place]  [address] Client [facility] - Client Client Site [facility] Provider Renford Dills - MD Contact Type Call Who Is Calling Patient / Member / Family / Caregiver Call Type Triage / Clinical Relationship To Patient Self Return Phone Number [phone] (Primary) Chief Complaint Sore Throat Reason for Call Request to Schedule Office Appointment Initial Comment Caller states that he would like to schedule an appointment. He has a sore throat and is not sure if it could be strep or something else. Translation No Nurse Assessment Nurse: Humfleet, RN, Estill Bamberg Date/Time (Eastern Time): 11/09/2021 8:30:45 AM Confirm and document reason for call. If symptomatic, describe symptoms. ---caller states he has a sore throat. last week was sneezing a lot and then developed a sore throat, has watch patches on tonsil and on uvula Does the patient have any new or worsening symptoms? ---Yes Will a triage be completed? ---Yes Related visit to physician within the last 2 weeks? ---No Does the PT have any chronic conditions? (i.e. diabetes, asthma, this includes High risk factors for pregnancy, etc.) ---Yes List chronic conditions. ---DM Is this a behavioral health or substance abuse call? ---No Guidelines Guideline Title Affirmed Question Affirmed Notes Nurse Date/Time (Eastern Time) Sore Throat Diabetes mellitus or weak immune system (e.g., HIV positive, cancer chemo, splenectomy, organ transplant, chronic steroids) Humfleet, RN, Estill Bamberg 11/09/2021 8:31:59 AM PLEASE NOTE: All timestamps contained within this report are represented as [place] Standard  Time. CONFIDENTIALTY NOTICE: This fax transmission is intended only for the addressee. It contains information that is legally privileged, confidential or otherwise protected from use or disclosure. If you are not the intended recipient, you are strictly prohibited from reviewing, disclosing, copying using or disseminating any of this information or taking any action in reliance on or regarding this information. If you have received this fax in error, please notify us immediately by telephone so that we can arrange for its return to [place]. Phone: [phone], Toll-Free: [phone], Fax: [phone] Page: 2 of 2 Call Id: [id number] [place]. Time Eilene Ghazi Time) Disposition Final User 11/09/2021 8:10:27 AM Send To RN Claxton, RN, Godwin 11/09/2021 8:34:04 AM See PCP within 24 Hours Yes Humfleet, RN, Shelly Coss Disagree/Comply Comply Caller Understands Yes PreDisposition Did not know what to do Care Advice Given Per Guideline SEE PCP WITHIN 24 HOURS: * IF OFFICE WILL BE OPEN: You need to be examined within the next 24 hours. Call your doctor (or NP/PA) when the office opens and make an appointment. CARE ADVICE given per Sore Throat (Adult) guideline. * You become worse CALL BACK IF: Comments User: Rozelle Logan, RN Date/Time Eilene Ghazi Time): 11/09/2021 8:32:49 AM pain is a 7-8 out of 10 Referrals REFERRED TO PCP OFFIC

## 2021-11-09 NOTE — Telephone Encounter (Signed)
Per appt notes pt already has appt with Dr Einar Pheasant 11/09/21 at 12:20. Sending note to Dr Einar Pheasant and [facility] CMA.

## 2021-11-09 NOTE — Progress Notes (Signed)
Subjective:     Edward Wright is a 61 y.o. male presenting for Sore Throat (X 2 days )     Sore Throat  This is a new problem. Episode onset: 11/07/2021. The pain is worse on the left side. There has been no fever. Associated symptoms include swollen glands. Pertinent negatives include no abdominal pain, congestion, coughing, diarrhea, ear discharge, ear pain, headaches, hoarse voice, shortness of breath, trouble swallowing or vomiting. He has had no exposure to strep or mono. Treatments tried: benadryl.   Niece recently complained of sore throat - 30s  Has alpha gal - recently ate steak - thought this could be the cause so took a benadryl    Review of Systems  Constitutional:  Negative for chills and fever.  HENT:  Positive for sneezing. Negative for congestion, ear discharge, ear pain, hoarse voice and trouble swallowing.   Eyes:  Positive for itching.  Respiratory:  Negative for cough and shortness of breath.   Gastrointestinal:  Negative for abdominal pain, diarrhea and vomiting.  Musculoskeletal:  Negative for arthralgias and myalgias.  Neurological:  Negative for headaches.    Social History   Tobacco Use  Smoking Status Former   Packs/day: 1.00   Years: 10.00   Pack years: 10.00   Types: Cigarettes   Quit date: 03/14/2019   Years since quitting: 2.6  Smokeless Tobacco Never  Tobacco Comments   recently quit smoking completely         Objective:    BP Readings from Last 3 Encounters:  11/09/21 140/76  10/13/21 (!) 142/70  06/08/21 140/77   Wt Readings from Last 3 Encounters:  11/09/21 254 lb 6 oz (115.4 kg)  10/13/21 254 lb (115.2 kg)  05/07/21 260 lb 9.6 oz (118.2 kg)    BP 140/76   Pulse 73   Temp 97.9 F (36.6 C) (Temporal)   Ht 6' (1.829 m)   Wt 254 lb 6 oz (115.4 kg)   SpO2 99%   BMI 34.50 kg/m    Physical Exam Constitutional:      General: He is not in acute distress.    Appearance: He is well-developed. He is not ill-appearing.   HENT:     Head: Normocephalic and atraumatic.     Right Ear: Tympanic membrane and ear canal normal.     Left Ear: Tympanic membrane and ear canal normal.     Nose: Mucosal edema and rhinorrhea present.     Right Sinus: No maxillary sinus tenderness or frontal sinus tenderness.     Left Sinus: No maxillary sinus tenderness or frontal sinus tenderness.     Mouth/Throat:     Pharynx: Uvula midline. Oropharyngeal exudate (on the uvula) and posterior oropharyngeal erythema present.     Tonsils: 0 on the right. 0 on the left.  Cardiovascular:     Rate and Rhythm: Normal rate and regular rhythm.     Heart sounds: No murmur heard. Pulmonary:     Effort: Pulmonary effort is normal. No respiratory distress.     Breath sounds: Normal breath sounds.  Musculoskeletal:     Cervical back: Neck supple.  Lymphadenopathy:     Cervical: No cervical adenopathy.  Skin:    General: Skin is warm and dry.     Capillary Refill: Capillary refill takes less than 2 seconds.  Neurological:     Mental Status: He is alert.     Rapid strept: positive     Assessment & Plan:   Problem  List Items Addressed This Visit   None Visit Diagnoses     Sore throat    -  Primary   Relevant Medications   penicillin v potassium (VEETID) 500 MG tablet   Other Relevant Orders   POCT rapid strep A (Completed)   Streptococcal sore throat       Relevant Medications   penicillin v potassium (VEETID) 500 MG tablet      Strept throat. Take abx   Return if symptoms worsen or fail to improve.  [name], MD

## 2021-12-03 ENCOUNTER — Encounter: Payer: Self-pay | Admitting: Internal Medicine

## 2021-12-10 ENCOUNTER — Encounter: Payer: Self-pay | Admitting: Internal Medicine

## 2021-12-10 ENCOUNTER — Ambulatory Visit (AMBULATORY_SURGERY_CENTER): Payer: BC Managed Care – PPO | Admitting: Internal Medicine

## 2021-12-10 VITALS — BP 134/77 | HR 76 | Temp 98.0°F | Resp 18 | Ht 72.0 in | Wt 254.0 lb

## 2021-12-10 DIAGNOSIS — D122 Benign neoplasm of ascending colon: Secondary | ICD-10-CM

## 2021-12-10 DIAGNOSIS — R1319 Other dysphagia: Secondary | ICD-10-CM

## 2021-12-10 DIAGNOSIS — K222 Esophageal obstruction: Secondary | ICD-10-CM

## 2021-12-10 DIAGNOSIS — K635 Polyp of colon: Secondary | ICD-10-CM | POA: Diagnosis not present

## 2021-12-10 DIAGNOSIS — D124 Benign neoplasm of descending colon: Secondary | ICD-10-CM | POA: Diagnosis not present

## 2021-12-10 DIAGNOSIS — Z1211 Encounter for screening for malignant neoplasm of colon: Secondary | ICD-10-CM | POA: Diagnosis not present

## 2021-12-10 DIAGNOSIS — K219 Gastro-esophageal reflux disease without esophagitis: Secondary | ICD-10-CM

## 2021-12-10 DIAGNOSIS — Z86010 Personal history of colonic polyps: Secondary | ICD-10-CM | POA: Diagnosis not present

## 2021-12-10 MED ORDER — SODIUM CHLORIDE 0.9 % IV SOLN: 500.0000 mL | Freq: Once | INTRAVENOUS | Status: DC

## 2021-12-13 ENCOUNTER — Telehealth: Payer: Self-pay

## 2021-12-15 ENCOUNTER — Encounter: Payer: Self-pay | Admitting: Internal Medicine

## 2021-12-19 ENCOUNTER — Telehealth: Payer: Self-pay | Admitting: Family Medicine

## 2021-12-19 NOTE — Telephone Encounter (Signed)
Please check with patient.  Need Dm2 f/u.  Thanks.

## 2021-12-30 ENCOUNTER — Encounter: Payer: Self-pay | Admitting: Family Medicine

## 2021-12-30 ENCOUNTER — Ambulatory Visit: Payer: BC Managed Care – PPO | Admitting: Family Medicine

## 2021-12-30 VITALS — BP 120/58 | HR 82 | Temp 98.0°F | Ht 72.0 in | Wt 255.0 lb

## 2021-12-30 DIAGNOSIS — E119 Type 2 diabetes mellitus without complications: Secondary | ICD-10-CM | POA: Diagnosis not present

## 2021-12-30 DIAGNOSIS — Z23 Encounter for immunization: Secondary | ICD-10-CM

## 2021-12-30 DIAGNOSIS — R1310 Dysphagia, unspecified: Secondary | ICD-10-CM | POA: Diagnosis not present

## 2021-12-30 DIAGNOSIS — E1129 Type 2 diabetes mellitus with other diabetic kidney complication: Secondary | ICD-10-CM | POA: Diagnosis not present

## 2021-12-30 DIAGNOSIS — L989 Disorder of the skin and subcutaneous tissue, unspecified: Secondary | ICD-10-CM

## 2021-12-30 DIAGNOSIS — R809 Proteinuria, unspecified: Secondary | ICD-10-CM

## 2021-12-30 DIAGNOSIS — Z91018 Allergy to other foods: Secondary | ICD-10-CM | POA: Diagnosis not present

## 2021-12-30 LAB — POCT GLYCOSYLATED HEMOGLOBIN (HGB A1C): Hemoglobin A1C: 6.7 % — AB (ref 4.0–5.6)

## 2021-12-30 MED ORDER — SEMGLEE 100 UNIT/ML ~~LOC~~ SOPN: 80.0000 [IU] | PEN_INJECTOR | Freq: Every day | SUBCUTANEOUS | Status: DC

## 2021-12-30 MED ORDER — OZEMPIC (0.25 OR 0.5 MG/DOSE) 2 MG/1.5ML ~~LOC~~ SOPN: PEN_INJECTOR | SUBCUTANEOUS | Status: DC

## 2021-12-30 NOTE — Progress Notes (Signed)
Diabetes:  Using medications without difficulties: yes Hypoglycemic episodes: rarely below 100, cautions d/w pt.   Hyperglycemic episodes:no Feet problems: no Blood Sugars averaging: 140s eye exam within last year: yes A1c improved.  Ozempic 0.'5mg'$   80 units insulin once a day.   Metformin '1000mg'$  BID.    He checked on coverage for shingrix.  D/w pt.    Papular lesions on the B dorsal hands, resolving in the last month.  Not itchy.    S/p endoscopy, still with some food sticking.  D/w pt about GI f/u.    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    H/o alpha gal d/w pt.  Cautions d/w pt.  No hives now.  He was able to eat steak in the meantime.  Still seeing the allergy clinic.    Meds, vitals, and allergies reviewed.   ROS: Per HPI unless specifically indicated in ROS section   GEN: nad, alert and oriented HEENT: ncat NECK: supple w/o LA CV: rrr. PULM: ctab, no inc wob ABD: soft, +bs EXT: no edema SKIN: no acute rash but he has several papular lesions on the bilateral dorsal hands without ulceration, they are approximately 1 cm across.

## 2021-12-30 NOTE — Patient Instructions (Addendum)
Please update Dr. Henrene Pastor.   If your med list doesn't match up, let me know.  Take care.  Glad to see you.

## 2021-12-31 ENCOUNTER — Encounter: Payer: Self-pay | Admitting: Family Medicine

## 2021-12-31 ENCOUNTER — Other Ambulatory Visit: Payer: BC Managed Care – PPO

## 2021-12-31 DIAGNOSIS — E782 Mixed hyperlipidemia: Secondary | ICD-10-CM

## 2022-01-01 LAB — NMR, LIPOPROFILE
Cholesterol, Total: 90 mg/dL — ABNORMAL LOW (ref 100–199)
HDL Particle Number: 22.7 umol/L — ABNORMAL LOW (ref >=30.5)
HDL-C: 25 mg/dL — ABNORMAL LOW (ref >39)
LDL Particle Number: 736 nmol/L (ref <1000)
LDL Size: 20.2 nm — ABNORMAL LOW (ref >20.5)
LDL-C (NIH Calc): 48 mg/dL (ref 0–99)
LP-IR Score: 59 — ABNORMAL HIGH (ref <=45)
Small LDL Particle Number: 433 nmol/L (ref <=527)
Triglycerides: 86 mg/dL (ref 0–149)

## 2022-01-02 NOTE — Assessment & Plan Note (Signed)
These look warty but they are resolving in the meantime and he opted not for treatment.  If they do not resolve he can let me know.  They do not look ominous or cancerous.

## 2022-01-02 NOTE — Assessment & Plan Note (Signed)
A1c clearly better. No change in meds Ozempic 0.'5mg'$   80 units insulin once a day.   Metformin '1000mg'$  BID.   I thanked him for his effort.

## 2022-01-02 NOTE — Assessment & Plan Note (Signed)
Routine cautions given to patient but fortunately he has improved in the meantime.

## 2022-01-02 NOTE — Assessment & Plan Note (Signed)
I asked him to follow-up with GI.

## 2022-01-03 DIAGNOSIS — G4733 Obstructive sleep apnea (adult) (pediatric): Secondary | ICD-10-CM | POA: Diagnosis not present

## 2022-01-04 DIAGNOSIS — G4733 Obstructive sleep apnea (adult) (pediatric): Secondary | ICD-10-CM | POA: Diagnosis not present

## 2022-01-05 ENCOUNTER — Other Ambulatory Visit: Payer: Self-pay | Admitting: Family Medicine

## 2022-01-05 MED ORDER — LISINOPRIL 2.5 MG PO TABS: 2.5000 mg | ORAL_TABLET | Freq: Every day | ORAL | 3 refills | Status: DC

## 2022-01-24 ENCOUNTER — Other Ambulatory Visit: Payer: Self-pay | Admitting: Family Medicine

## 2022-01-24 NOTE — Telephone Encounter (Signed)
Refill request Anusol suppository Last refill 11/04/21 #12/2 Last office visit 12/30/21

## 2022-01-25 NOTE — Telephone Encounter (Signed)
Sent. Thanks.   

## 2022-02-03 DIAGNOSIS — G4733 Obstructive sleep apnea (adult) (pediatric): Secondary | ICD-10-CM | POA: Diagnosis not present

## 2022-02-14 ENCOUNTER — Encounter: Payer: Self-pay | Admitting: Family Medicine

## 2022-02-14 NOTE — Telephone Encounter (Signed)
Please read below...

## 2022-03-06 DIAGNOSIS — G4733 Obstructive sleep apnea (adult) (pediatric): Secondary | ICD-10-CM | POA: Diagnosis not present

## 2022-03-20 ENCOUNTER — Other Ambulatory Visit: Payer: Self-pay | Admitting: Family Medicine

## 2022-03-20 DIAGNOSIS — E119 Type 2 diabetes mellitus without complications: Secondary | ICD-10-CM

## 2022-03-20 DIAGNOSIS — Z125 Encounter for screening for malignant neoplasm of prostate: Secondary | ICD-10-CM

## 2022-03-25 ENCOUNTER — Other Ambulatory Visit: Payer: BC Managed Care – PPO

## 2022-03-29 ENCOUNTER — Other Ambulatory Visit (INDEPENDENT_AMBULATORY_CARE_PROVIDER_SITE_OTHER): Payer: BC Managed Care – PPO

## 2022-03-29 DIAGNOSIS — Z125 Encounter for screening for malignant neoplasm of prostate: Secondary | ICD-10-CM

## 2022-03-29 DIAGNOSIS — E119 Type 2 diabetes mellitus without complications: Secondary | ICD-10-CM | POA: Diagnosis not present

## 2022-03-29 LAB — COMPREHENSIVE METABOLIC PANEL
ALT: 26 U/L (ref 0–53)
AST: 26 U/L (ref 0–37)
Albumin: 4.2 g/dL (ref 3.5–5.2)
Alkaline Phosphatase: 50 U/L (ref 39–117)
BUN: 14 mg/dL (ref 6–23)
CO2: 29 mEq/L (ref 19–32)
Calcium: 9.3 mg/dL (ref 8.4–10.5)
Chloride: 102 mEq/L (ref 96–112)
Creatinine, Ser: 1.11 mg/dL (ref 0.40–1.50)
GFR: 70.84 mL/min (ref >60.00)
Glucose, Bld: 122 mg/dL — ABNORMAL HIGH (ref 70–99)
Potassium: 4.4 mEq/L (ref 3.5–5.1)
Sodium: 140 mEq/L (ref 135–145)
Total Bilirubin: 0.7 mg/dL (ref 0.2–1.2)
Total Protein: 7 g/dL (ref 6.0–8.3)

## 2022-03-29 LAB — LIPID PANEL
Cholesterol: 82 mg/dL (ref 0–200)
HDL: 25.4 mg/dL — ABNORMAL LOW (ref >39.00)
LDL Cholesterol: 40 mg/dL (ref 0–99)
NonHDL: 56.33
Total CHOL/HDL Ratio: 3
Triglycerides: 83 mg/dL (ref 0.0–149.0)
VLDL: 16.6 mg/dL (ref 0.0–40.0)

## 2022-03-29 LAB — MICROALBUMIN / CREATININE URINE RATIO
Creatinine,U: 242.1 mg/dL
Microalb Creat Ratio: 0.7 mg/g (ref 0.0–30.0)
Microalb, Ur: 1.7 mg/dL (ref 0.0–1.9)

## 2022-03-29 LAB — PSA: PSA: 0.36 ng/mL (ref 0.10–4.00)

## 2022-03-29 LAB — HEMOGLOBIN A1C: Hgb A1c MFr Bld: 6.8 % — ABNORMAL HIGH (ref 4.6–6.5)

## 2022-04-01 ENCOUNTER — Encounter: Payer: Self-pay | Admitting: Family Medicine

## 2022-04-01 ENCOUNTER — Ambulatory Visit (INDEPENDENT_AMBULATORY_CARE_PROVIDER_SITE_OTHER): Payer: BC Managed Care – PPO | Admitting: Family Medicine

## 2022-04-01 VITALS — BP 116/60 | HR 89 | Temp 97.9°F | Ht 72.0 in | Wt 250.0 lb

## 2022-04-01 DIAGNOSIS — Z7189 Other specified counseling: Secondary | ICD-10-CM

## 2022-04-01 DIAGNOSIS — F5101 Primary insomnia: Secondary | ICD-10-CM

## 2022-04-01 DIAGNOSIS — R21 Rash and other nonspecific skin eruption: Secondary | ICD-10-CM

## 2022-04-01 DIAGNOSIS — E1129 Type 2 diabetes mellitus with other diabetic kidney complication: Secondary | ICD-10-CM

## 2022-04-01 DIAGNOSIS — Z23 Encounter for immunization: Secondary | ICD-10-CM | POA: Diagnosis not present

## 2022-04-01 DIAGNOSIS — E785 Hyperlipidemia, unspecified: Secondary | ICD-10-CM

## 2022-04-01 DIAGNOSIS — Z91018 Allergy to other foods: Secondary | ICD-10-CM

## 2022-04-01 DIAGNOSIS — Z0000 Encounter for general adult medical examination without abnormal findings: Secondary | ICD-10-CM

## 2022-04-01 DIAGNOSIS — M549 Dorsalgia, unspecified: Secondary | ICD-10-CM

## 2022-04-01 MED ORDER — DIAZEPAM 5 MG PO TABS: 5.0000 mg | ORAL_TABLET | Freq: Three times a day (TID) | ORAL | 0 refills | Status: DC | PRN

## 2022-04-01 MED ORDER — METFORMIN HCL 500 MG PO TABS: ORAL_TABLET | ORAL | 12 refills | Status: DC

## 2022-04-01 MED ORDER — TRAMADOL HCL 50 MG PO TABS: 50.0000 mg | ORAL_TABLET | Freq: Three times a day (TID) | ORAL | 1 refills | Status: DC | PRN

## 2022-04-01 MED ORDER — MECLIZINE HCL 25 MG PO TABS: 25.0000 mg | ORAL_TABLET | Freq: Three times a day (TID) | ORAL | 1 refills | Status: DC | PRN

## 2022-04-01 NOTE — Progress Notes (Unsigned)
CPE- See plan.  Routine anticipatory guidance given to patient.  See health maintenance.  The possibility exists that previously documented standard health maintenance information may have been brought forward from a previous encounter into this note.  If needed, that same information has been updated to reflect the current situation based on today's encounter.    Tetanus 2022 Flu 2023 PNA 2014 Shingles d/w pt.   had first dose, should be okay to proceed.   covid vaccine prev done Colon cancer screening- colonoscopy 2023 PSA 2023 Living will d/w pt. Would have his wife designated if patient were incapacitated.    Diet and exercise d/w pt.  Working on both.   Lung cancer screening d/w pt.  he should get a call abou that.  He'll update me as needed.    Diabetes:  Using medications without difficulties: yes Hypoglycemic episodes: very rare, cautions d/w pt.  Corrected with a snack.   Hyperglycemic episodes:no Feet problems: occ tingling, not now.   Blood Sugars averaging: usually ~ 110-130s eye exam within last year: yes Ozempic 0.5.  metformin 2 tabs BID.   80 units daily.    He had a rash B lateral thighs,  this was about 2-3 weeks after the shingix shot but not right after.  He didn't have vesicles.  [place]clear if [place]    Tramadol and valium used prn for back flares.    Going on a cruise, d/w pt about meclizine use.    Alpha gal d/w pt.  Cautions d/w pt.  He had prime rib last night w/o troubles.    Hypertension:    Using medication without problems or lightheadedness: yes Chest pain with exertion:no Edema:no Short of breath:no  Insomnia.  Rare [place] of zaleplon.  No ADE on med.    Elevated Cholesterol: Using medications without problems: yes Muscle aches: no Diet compliance: d/w pt.   Exercise: d/w pt.   Labs d/w pt.  He is still on zetia but d/w pt about cessation and continuing statin.    PMH and SH reviewed  Meds, vitals, and allergies  reviewed.   ROS: Per HPI.  Unless specifically indicated otherwise in HPI, the patient denies:  General: fever. Eyes: acute vision changes ENT: sore throat Cardiovascular: chest pain Respiratory: SOB GI: vomiting GU: dysuria Musculoskeletal: acute back pain Derm: acute rash Neuro: acute motor dysfunction Psych: worsening mood Endocrine: polydipsia Heme: bleeding Allergy: hayfever  GEN: nad, alert and oriented HEENT: ncat NECK: supple w/o LA CV: rrr. PULM: ctab, no inc wob ABD: soft, +bs EXT: no edema SKIN: no acute rash but scattered post inflammatory pigmented macules on the R thigh  Diabetic foot exam: Normal inspection No skin breakdown No calluses  Normal DP pulses Normal sensation to light touch and monofilament Nails normal

## 2022-04-01 NOTE — Patient Instructions (Addendum)
Let me know if you can't get set up for the lung cancer screening.   Take care.  Glad to see you. Stop zetia.   Update me as needed.  Plan on recheck in about 6 months with A1c at the visit.

## 2022-04-03 ENCOUNTER — Other Ambulatory Visit: Payer: Self-pay | Admitting: Family Medicine

## 2022-04-03 DIAGNOSIS — G4733 Obstructive sleep apnea (adult) (pediatric): Secondary | ICD-10-CM | POA: Diagnosis not present

## 2022-04-03 NOTE — Assessment & Plan Note (Addendum)
Tetanus 2022 Flu 2023 PNA 2014 Shingles d/w pt.   had first dose, should be okay to proceed.   covid vaccine prev done Colon cancer screening- colonoscopy 2023 PSA 2023 Living will d/w pt. Would have his wife designated if patient were incapacitated.    Diet and exercise d/w pt.  Working on both.   Lung cancer screening d/w pt.  he should get a call about that.  He'll update me as needed.

## 2022-04-03 NOTE — Assessment & Plan Note (Signed)
Benign-appearing residual postinflammatory macules noted that do not look like they need treatment at this point.  He will update me as needed.

## 2022-04-03 NOTE — Assessment & Plan Note (Signed)
Tramadol and valium used prn for back flares.  No ADE on med.  No current sx.  meds rx'd to have on hand in case he has a flare of symptoms.

## 2022-04-03 NOTE — Assessment & Plan Note (Signed)
A1c controlled. Ozempic 0.5.  metformin 2 tabs BID.   Insulin 80 units daily.   No change in meds. Continue work on diet and exercise and recheck periodically.  He agrees to plan.

## 2022-04-03 NOTE — Assessment & Plan Note (Signed)
Living will d/w pt.   Would have his wife designated if patient were incapacitated.  

## 2022-04-03 NOTE — Assessment & Plan Note (Signed)
Alpha gal d/w pt.  Cautions d/w pt.  He had prime rib last night w/o troubles.

## 2022-04-03 NOTE — Assessment & Plan Note (Signed)
Continue prn zaleplon, routine cautions discussed with patient.

## 2022-04-03 NOTE — Assessment & Plan Note (Signed)
Labs d/w pt.  He is still on zetia but d/w pt about cessation and continuing statin, based on previous cardiology notes.

## 2022-04-04 ENCOUNTER — Telehealth: Payer: Self-pay

## 2022-04-04 ENCOUNTER — Other Ambulatory Visit: Payer: Self-pay | Admitting: Interventional Cardiology

## 2022-04-04 NOTE — Telephone Encounter (Signed)
Prior auth started for traMADol HCl '50MG'$  tablets. Erline Hau Key: [id number] - Rx #: [id number] Waiting for determination.

## 2022-04-05 NOTE — Telephone Encounter (Signed)
Prior auth for Tramadol Hcl '50MG'$  tablets has been denied.  No reason given on Cover My Meds.  I called [employer], spoke to [place]'.  This is not covered under patient's benefit plan due to benefit quantity limitations of one tablet per day.

## 2022-04-06 MED ORDER — TRAMADOL HCL 50 MG PO TABS: 50.0000 mg | ORAL_TABLET | Freq: Every day | ORAL | 1 refills | Status: DC | PRN

## 2022-04-06 NOTE — Addendum Note (Signed)
Addended by: Tonia Ghent on: 04/06/2022 02:03 PM   Modules accepted: Orders

## 2022-04-06 NOTE — Telephone Encounter (Signed)
LMTCB

## 2022-04-06 NOTE — Telephone Encounter (Signed)
Please notify pt.  I resent for daily use.  He can see if that is covered.  Thanks.

## 2022-04-09 DIAGNOSIS — G4733 Obstructive sleep apnea (adult) (pediatric): Secondary | ICD-10-CM | POA: Diagnosis not present

## 2022-04-10 ENCOUNTER — Other Ambulatory Visit: Payer: Self-pay | Admitting: Family Medicine

## 2022-04-12 ENCOUNTER — Other Ambulatory Visit: Payer: Self-pay | Admitting: Family Medicine

## 2022-04-12 ENCOUNTER — Encounter: Payer: Self-pay | Admitting: *Deleted

## 2022-04-24 NOTE — Progress Notes (Unsigned)
HPI-  male former smoker followed for obstructive sleep apnea/CPAP, complicated by DM 2, muscle cramps NPSG was prior to 2009- not in EMR  --------------------------------------------------------------------------  04/22/21- 60 year old male former smoker followed for OSA/CPAP, Insomnia,, complicated by DM 2, muscle cramps, GERD, Morbid Obesity, Covid infection 2020,  CPAP 14/Adapt Download- compliance 90%, AHI 1.7/ hr Body weight today- 259 lbs Covid vax- 2 Phizer Flu vax-had Download reviewed.  Machine is about 63 years old and mechanically working well. Since COVID infection in 2020 reports tiring more easily with some awareness of shortness of breath/dyspnea on exertion.  Had smoked up to a pack and 1/2/day until quitting in 2019.  No significant cough or wheeze. CT chest low dose screen 04/16/20- IMPRESSION: Lung-RADS 2, benign appearance or behavior. Continue annual screening with low-dose chest CT without contrast in 12 months. Aortic Atherosclerosis (ICD10-I70.0) and Emphysema (ICD10-J43.9  04/25/22-  61 year old male former smoker followed for OSA/CPAP, Insomnia,, complicated by DM 2, muscle cramps, GERD, Morbid Obesity, Covid infection 2020,  CPAP 14/Adapt Download- compliance Body weight today- Covid vax- 2 Phizer Flu vax-   ROS- see HPI   + sign = positive Constitutional:   No-   weight loss, night sweats, fevers, chills,  fatigue, lassitude. HEENT:   No-  headaches, difficulty swallowing, tooth/dental problems, sore throat,       No-  sneezing, itching, ear ache, nasal congestion, post nasal drip,  CV:  No-   chest pain, orthopnea, PND, swelling in lower extremities, anasarca, dizziness, palpitations Resp: + shortness of breath with exertion or at rest.              No-   productive cough,  No non-productive cough,  No-  coughing up of blood.              No-   change in color of mucus.  No- wheezing.   Skin: No-   rash or lesions. GI:  No-   heartburn, indigestion,  abdominal pain, nausea, vomiting,  GU:  MS:  No-   joint pain or swelling.  . Neuro- nothing unusual  Psych:  No- change in mood or affect. No depression or anxiety.  No memory loss.  OBJ- General- Alert, Oriented, Affect-appropriate, calm, Distress- none acute,  + obese Skin- rash-none, lesions- none, excoriation- none Lymphadenopathy- none Head- atraumatic            Eyes- Gross vision intact, PERRLA, conjunctivae clear secretions            Ears- Hearing, canals-normal            Nose- Clear, no-Septal dev, mucus, polyps, erosion, perforation             Throat- Mallampati III , mucosa clear , drainage- none, tonsils- atrophic Neck- flexible , trachea midline, no stridor , thyroid nl, carotid no bruit Chest - symmetrical excursion , unlabored           Heart/CV- RRR , no murmur , no gallop  , no rub, nl s1 s2                           - JVD- none , edema- none, stasis changes- none, varices- none           Lung- clear to P&A, wheeze- none, cough- none , dullness-none, rub- none           Chest wall-  Abd-  Br/ Gen/ Rectal- Not done, not indicated Extrem-  cyanosis- none, clubbing, none, atrophy- none, strength- nl Neuro- grossly intact to observation

## 2022-04-25 ENCOUNTER — Encounter: Payer: Self-pay | Admitting: Internal Medicine

## 2022-04-25 ENCOUNTER — Ambulatory Visit: Payer: BC Managed Care – PPO | Admitting: Internal Medicine

## 2022-04-25 VITALS — BP 140/70 | HR 84 | Ht 72.0 in | Wt 251.2 lb

## 2022-04-25 DIAGNOSIS — Z122 Encounter for screening for malignant neoplasm of respiratory organs: Secondary | ICD-10-CM | POA: Diagnosis not present

## 2022-04-25 DIAGNOSIS — G4733 Obstructive sleep apnea (adult) (pediatric): Secondary | ICD-10-CM

## 2022-04-25 DIAGNOSIS — R911 Solitary pulmonary nodule: Secondary | ICD-10-CM

## 2022-04-25 NOTE — Assessment & Plan Note (Signed)
We will check on enrollment status- low dose screening CT program

## 2022-04-25 NOTE — Assessment & Plan Note (Signed)
Benefits from CPAP Plan- continue .CPAP 14. Printed script for supplies

## 2022-04-25 NOTE — Patient Instructions (Addendum)
Order- printed script for on-line use-  CPAP supplies for AirSense 10 , mask of choice, humidifier supplies, hoses   We can continue CPAP 14  Order- Eric Form team- please continue low dose CT screening program if eligible

## 2022-05-03 ENCOUNTER — Encounter: Payer: Self-pay | Admitting: Internal Medicine

## 2022-05-04 ENCOUNTER — Other Ambulatory Visit: Payer: Self-pay

## 2022-05-04 ENCOUNTER — Encounter: Payer: Self-pay | Admitting: Family Medicine

## 2022-05-04 DIAGNOSIS — Z87891 Personal history of nicotine dependence: Secondary | ICD-10-CM

## 2022-05-04 DIAGNOSIS — Z122 Encounter for screening for malignant neoplasm of respiratory organs: Secondary | ICD-10-CM

## 2022-05-08 ENCOUNTER — Other Ambulatory Visit: Payer: Self-pay | Admitting: Family Medicine

## 2022-05-09 NOTE — Telephone Encounter (Signed)
Refill request for DIAZEPAM 5 MG TABLET   LOV - 04/01/22 Next OV - not scheduled Last refill - 04/01/22 #30/0

## 2022-05-30 NOTE — Progress Notes (Signed)
Cardiology Office Note   Date:  05/31/2022   ID:  Edward, Wright [birth year], MRN [id number]  PCP:  Edward Nam, MD    No chief complaint on file.  CAD  Wt Readings from Last 3 Encounters:  05/31/22 255 lb 12.8 oz (116 kg)  04/25/22 251 lb 3.2 oz (113.9 kg)  04/01/22 250 lb (113.4 kg)       History of Present Illness: Edward Wright is a 61 y.o. male  with prolonged shortness of breath after COVID in Feb 2021.   2021 CT: "No evidence of thoracic aortic aneurysm. Mild atherosclerotic calcifications of the aortic arch.   Mild three-vessel coronary atherosclerosis."   Valve surgery in an uncle years ago.    2022 coronary CT: " Coronary calcium score of 547. This was 34 percentile for age and sex matched control.   2. Normal coronary origin with right dominance.   3. Diffuse calcified plaque. Proximal LAD stenosis 30 to 49%, sending for FFR. Mid circumflex calcified plaque with probable minimal stenosis 0 to 24%. Scattered RCA calcifications, likely minimal stenosis 0 to 24%."  Denies : Chest pain. Dizziness. Leg edema. Nitroglycerin use. Orthopnea. Palpitations. Paroxysmal nocturnal dyspnea. Resting Shortness of breath. Syncope.    Heading on a cruise to the [place].   Past Medical History:  Diagnosis Date   Bell's palsy    L sided with mild persistent lip droop   BPH (benign prostatic hyperplasia) 07/1997   Colon polyps    adenomatous   Diabetes mellitus type II 01/2003   Diverticulosis    GERD (gastroesophageal reflux disease) 11/2002   Heart murmur    history of   Hyperlipemia 04/1989   Internal hemorrhoids    Peptic stricture of esophagus    Sleep apnea    sleep study pos on CPAP   Spermatocele     Past Surgical History:  Procedure Laterality Date   CHOLECYSTECTOMY  01/05/2004   COLONOSCOPY     CYSTECTOMY Right 06/20/1988   ganglion wrist   DOPPLER ECHOCARDIOGRAPHY  01/13/2004   normal   hematuria  12/19/1994   w/u neg  (urol)   UPPER GASTROINTESTINAL ENDOSCOPY     VASECTOMY  05/26/2006     Current Outpatient Medications  Medication Sig Dispense Refill   atorvastatin (LIPITOR) 80 MG tablet TAKE 1 TABLET BY MOUTH EVERY DAY 30 tablet 5   Blood Glucose Monitoring Suppl (CONTOUR NEXT ONE) DEVI 1 Device by Does not apply route as needed. Use to check blood sugar twice daily and as directed.  Insulin Dependent.  Dx:  E11.9 1 each 0   clotrimazole (LOTRIMIN) 1 % cream APPLY TOPICALLY 2 (TWO) TIMES DAILY.AS NEEDED     CONTOUR NEXT TEST test strip USE AS INSTRUCTED 2 (TWO) TIMES DAILY AS NEEDED TO CHECK BLOOD SUGAR. INSULIN DEPENDENT. DX: E11.9 100 strip 12   diazepam (VALIUM) 5 MG tablet TAKE 1 TABLET BY MOUTH 3 TIMES DAILY AS NEEDED. 30 tablet 0   hydrocortisone (ANUSOL-HC) 25 MG suppository UNWRAP AND PLACE 1 SUPPOSITORY (25 MG TOTAL) RECTALLY 3 (THREE) TIMES DAILY AS NEEDED. 12 suppository 2   insulin glargine (SEMGLEE, YFGN,) 100 UNIT/ML Solostar Pen Inject 80 Units into the skin daily. Inject up to 100 units daily.     insulin glargine-yfgn (SEMGLEE, YFGN,) 100 UNIT/ML Pen INJECT UP TO 100 UNITS DAILY. 3 mL 3   Insulin Pen Needle (B-D UF III MINI PEN NEEDLES) 31G X 5 MM MISC USE DAILY WITH INSULIN  PEN. Dx E11.9 100 each 3   lisinopril (ZESTRIL) 2.5 MG tablet Take 1 tablet (2.5 mg total) by mouth daily. 90 tablet 3   meclizine (ANTIVERT) 25 MG tablet Take 1 tablet (25 mg total) by mouth 3 (three) times daily as needed for dizziness. 90 tablet 1   metFORMIN (GLUCOPHAGE) 500 MG tablet TAKE 2 TABLETS (1,000 MG TOTAL) BY MOUTH 2 (TWO) TIMES DAILY WITH A MEAL. 120 tablet 12   NON FORMULARY CPAP 13 Advanced, use as directed     omeprazole (PRILOSEC) 40 MG capsule TAKE 1 CAPSULE BY MOUTH EVERY DAY 30 capsule 12   OZEMPIC, 0.25 OR 0.5 MG/DOSE, 2 MG/3ML SOPN INJECT 0.25 MILLIGRAM INJECTED SUBCUTANEOUSLY ONCE A WWEK FOR 4 WEEKS THEN 0.5 MG ONCE WEEKLY 3 mL 8   Semaglutide,0.25 or 0.5MG /DOS, (OZEMPIC, 0.25 OR 0.5 MG/DOSE,)  2 MG/1.5ML SOPN 0.5 mg injected subcutaneously once weekly     traMADol (ULTRAM) 50 MG tablet Take 1 tablet (50 mg total) by mouth daily as needed. 30 tablet 1   zaleplon (SONATA) 5 MG capsule Take 1 capsule (5 mg total) by mouth at bedtime as needed for sleep. 30 capsule 5   No current facility-administered medications for this visit.    Allergies:   Hydrocodone, Jardiance [empagliflozin], and Meat [alpha-gal]    Social History:  The patient  reports that he quit smoking about 3 years ago. His smoking use included cigarettes. He has a 10.00 pack-year smoking history. He has never used smokeless tobacco. He reports current alcohol use. He reports that he does not use drugs.   Family History:  The patient's family history includes Cancer in his father and another family member; Dementia in his mother; Diabetes in his paternal grandfather; Heart disease in his mother; Hypertension in his mother; Transient ischemic attack in his mother.    ROS:  Please see the history of present illness.   Otherwise, review of systems are positive for mild weight loss with Ozempic- not as much as he expected.   All other systems are reviewed and negative.    PHYSICAL EXAM: VS:  BP 130/62   Pulse 88   Ht 6' (1.829 m)   Wt 255 lb 12.8 oz (116 kg)   SpO2 94%   BMI 34.69 kg/m  , BMI Body mass index is 34.69 kg/m. GEN: Well nourished, well developed, in no acute distress HEENT: normal Neck: no JVD, carotid bruits, or masses Cardiac: RRR; no murmurs, rubs, or gallops,no edema  Respiratory:  clear to auscultation bilaterally, normal work of breathing GI: soft, nontender, nondistended, + BS MS: no deformity or atrophy Skin: warm and dry, no rash Neuro:  Strength and sensation are intact Psych: euthymic mood, full affect   EKG:   The ekg ordered today demonstrates NSR, RBBB   Recent Labs: 03/29/2022: ALT 26; BUN 14; Creatinine, Ser 1.11; Potassium 4.4; Sodium 140   Lipid Panel    Component Value  Date/Time   CHOL 82 03/29/2022 0803   TRIG 83.0 03/29/2022 0803   HDL 25.40 (L) 03/29/2022 0803   CHOLHDL 3 03/29/2022 0803   VLDL 16.6 03/29/2022 0803   LDLCALC 40 03/29/2022 0803     Other studies Reviewed: Additional studies/ records that were reviewed today with results demonstrating: labs reviewed.   ASSESSMENT AND PLAN:  Coronary artery calcification: No angina.  Continue aggressive secondary prevention.   Diabetes: On insulin.  A1C 6.8.  High fiber diet. Hyperlipidemia:   LDL 40 on atorvastatin and Zetia.  Stopping  Zetia as it did not change particle numbers in the past.   Diet has changed more recently since Ozempic was started.   Elevated blood pressure: Has been on ACE inhibitor in the past for renal protection. Obesity: taking Ozempic.  ,  Whole food plant-based diet.  High-fiber diet. avoid processed foods.    Current medicines are reviewed at length with the patient today.  The patient concerns regarding his medicines were addressed.  The following changes have been made:  No change  Labs/ tests ordered today include:  No orders of the defined types were placed in this encounter.   Recommend 150 minutes/week of aerobic exercise Low fat, low carb, high fiber diet recommended  Disposition:   FU in 1 year   Signed, Lance Muss, MD  05/31/2022 4:45 PM    [facility] [address] [place], [place], [place]  [address] Phone: [phone]; Fax: [phone]

## 2022-05-31 ENCOUNTER — Encounter: Payer: Self-pay | Admitting: Interventional Cardiology

## 2022-05-31 ENCOUNTER — Ambulatory Visit: Payer: BC Managed Care – PPO | Attending: Interventional Cardiology | Admitting: Interventional Cardiology

## 2022-05-31 VITALS — BP 130/62 | HR 88 | Ht 72.0 in | Wt 255.8 lb

## 2022-05-31 DIAGNOSIS — E66811 Obesity, class 1: Secondary | ICD-10-CM

## 2022-05-31 DIAGNOSIS — I2510 Atherosclerotic heart disease of native coronary artery without angina pectoris: Secondary | ICD-10-CM | POA: Diagnosis not present

## 2022-05-31 DIAGNOSIS — E1159 Type 2 diabetes mellitus with other circulatory complications: Secondary | ICD-10-CM | POA: Diagnosis not present

## 2022-05-31 DIAGNOSIS — R0609 Other forms of dyspnea: Secondary | ICD-10-CM

## 2022-05-31 DIAGNOSIS — I2584 Coronary atherosclerosis due to calcified coronary lesion: Secondary | ICD-10-CM

## 2022-05-31 DIAGNOSIS — E669 Obesity, unspecified: Secondary | ICD-10-CM

## 2022-05-31 DIAGNOSIS — E782 Mixed hyperlipidemia: Secondary | ICD-10-CM

## 2022-05-31 NOTE — Patient Instructions (Signed)
Medication Instructions:  Your physician recommends that you continue on your current medications as directed. Please refer to the Current Medication list given to you today.  *If you need a refill on your cardiac medications before your next appointment, please call your pharmacy*   Lab Work: none If you have labs (blood work) drawn today and your tests are completely normal, you will receive your results only by: MyChart Message (if you have MyChart) OR A paper copy in the mail If you have any lab test that is abnormal or we need to change your treatment, we will call you to review the results.   Testing/Procedures: none   Follow-Up: At [facility], you and your health needs are our priority.  As part of our continuing mission to provide you with exceptional heart care, we have created designated Provider Care Teams.  These Care Teams include your primary Cardiologist (physician) and Advanced Practice Providers (APPs -  Physician Assistants and Nurse Practitioners) who all work together to provide you with the care you need, when you need it.  We recommend signing up for the patient portal called "MyChart".  Sign up information is provided on this After Visit Summary.  MyChart is used to connect with patients for Virtual Visits (Telemedicine).  Patients are able to view lab/test results, encounter notes, upcoming appointments, etc.  Non-urgent messages can be sent to your provider as well.   To learn more about what you can do with MyChart, go to [email or web address].    Your next appointment:   12 month(s)  The format for your next appointment:   In Person  Provider:   Jayadeep Varanasi, MD     Other Instructions   High-Fiber Eating Plan Fiber, also called dietary fiber, is a type of carbohydrate. It is found foods such as fruits, vegetables, whole grains, and beans. A high-fiber diet can have many health benefits. Your health care provider may recommend a  high-fiber diet to help: Prevent constipation. Fiber can make your bowel movements more regular. Lower your cholesterol. Relieve the following conditions: Inflammation of veins in the anus (hemorrhoids). Inflammation of specific areas of the digestive tract (uncomplicated diverticulosis). A problem of the large intestine, also called the colon, that sometimes causes pain and diarrhea (irritable bowel syndrome, or IBS). Prevent overeating as part of a weight-loss plan. Prevent heart disease, type 2 diabetes, and certain cancers. What are tips for following this plan? Reading food labels  Check the nutrition facts label on food products for the amount of dietary fiber. Choose foods that have 5 grams of fiber or more per serving. The goals for recommended daily fiber intake include: Men (age 50 or younger): 34-38 g. Men (over age 50): 28-34 g. Women (age 50 or younger): 25-28 g. Women (over age 50): 22-25 g. Your daily fiber goal is _____________ g. Shopping Choose whole fruits and vegetables instead of processed forms, such as apple juice or applesauce. Choose a wide variety of high-fiber foods such as avocados, lentils, oats, and kidney beans. Read the nutrition facts label of the foods you choose. Be aware of foods with added fiber. These foods often have high sugar and sodium amounts per serving. Cooking Use whole-grain flour for baking and cooking. Cook with brown rice instead of white rice. Meal planning Start the day with a breakfast that is high in fiber, such as a cereal that contains 5 g of fiber or more per serving. Eat breads and cereals that are made with whole-grain flour   instead of refined flour or white flour. Eat brown rice, bulgur wheat, or millet instead of white rice. Use beans in place of meat in soups, salads, and pasta dishes. Be sure that half of the grains you eat each day are whole grains. General information You can get the recommended daily intake of dietary  fiber by: Eating a variety of fruits, vegetables, grains, nuts, and beans. Taking a fiber supplement if you are not able to take in enough fiber in your diet. It is better to get fiber through food than from a supplement. Gradually increase how much fiber you consume. If you increase your intake of dietary fiber too quickly, you may have bloating, cramping, or gas. Drink plenty of water to help you digest fiber. Choose high-fiber snacks, such as berries, raw vegetables, nuts, and popcorn. What foods should I eat? Fruits Berries. Pears. Apples. Oranges. Avocado. Prunes and raisins. Dried figs. Vegetables Sweet potatoes. Spinach. Kale. Artichokes. Cabbage. Broccoli. Cauliflower. Green peas. Carrots. Squash. Grains Whole-grain breads. Multigrain cereal. Oats and oatmeal. Brown rice. Barley. Bulgur wheat. Millet. Quinoa. Bran muffins. Popcorn. Rye wafer crackers. Meats and other proteins Navy beans, kidney beans, and pinto beans. Soybeans. Split peas. Lentils. Nuts and seeds. Dairy Fiber-fortified yogurt. Beverages Fiber-fortified soy milk. Fiber-fortified orange juice. Other foods Fiber bars. The items listed above may not be a complete list of recommended foods and beverages. Contact a dietitian for more information. What foods should I avoid? Fruits Fruit juice. Cooked, strained fruit. Vegetables Fried potatoes. Canned vegetables. Well-cooked vegetables. Grains White bread. Pasta made with refined flour. White rice. Meats and other proteins Fatty cuts of meat. Fried chicken or fried fish. Dairy Milk. Yogurt. Cream cheese. Sour cream. Fats and oils Butters. Beverages Soft drinks. Other foods Cakes and pastries. The items listed above may not be a complete list of foods and beverages to avoid. Talk with your dietitian about what choices are best for you. Summary Fiber is a type of carbohydrate. It is found in foods such as fruits, vegetables, whole grains, and beans. A  high-fiber diet has many benefits. It can help to prevent constipation, lower blood cholesterol, aid weight loss, and reduce your risk of heart disease, diabetes, and certain cancers. Increase your intake of fiber gradually. Increasing fiber too quickly may cause cramping, bloating, and gas. Drink plenty of water while you increase the amount of fiber you consume. The best sources of fiber include whole fruits and vegetables, whole grains, nuts, seeds, and beans. This information is not intended to replace advice given to you by your health care provider. Make sure you discuss any questions you have with your health care provider. Document Revised: 10/10/2019 Document Reviewed: 10/10/2019 Elsevier Patient Education  2023 Elsevier Inc.  Important Information About Sugar       

## 2022-06-01 NOTE — Addendum Note (Signed)
Addended by: Sharee Holster R on: 06/01/2022 05:11 PM   Modules accepted: Orders

## 2022-06-06 ENCOUNTER — Other Ambulatory Visit: Payer: Self-pay | Admitting: Family Medicine

## 2022-06-10 ENCOUNTER — Ambulatory Visit
Admission: RE | Admit: 2022-06-10 | Discharge: 2022-06-10 | Disposition: A | Discharge status: Home / Self Care | Payer: BC Managed Care – PPO | Source: Ambulatory Visit | Attending: Family Medicine | Admitting: Family Medicine

## 2022-06-10 DIAGNOSIS — Z87891 Personal history of nicotine dependence: Secondary | ICD-10-CM | POA: Diagnosis not present

## 2022-06-10 DIAGNOSIS — Z122 Encounter for screening for malignant neoplasm of respiratory organs: Secondary | ICD-10-CM

## 2022-06-15 ENCOUNTER — Other Ambulatory Visit: Payer: Self-pay

## 2022-06-15 DIAGNOSIS — Z122 Encounter for screening for malignant neoplasm of respiratory organs: Secondary | ICD-10-CM

## 2022-06-15 DIAGNOSIS — Z87891 Personal history of nicotine dependence: Secondary | ICD-10-CM

## 2022-06-22 NOTE — Telephone Encounter (Signed)
Insurance has been added.

## 2022-06-23 ENCOUNTER — Telehealth: Payer: Self-pay

## 2022-06-23 ENCOUNTER — Other Ambulatory Visit (HOSPITAL_COMMUNITY): Payer: Self-pay

## 2022-06-23 NOTE — Telephone Encounter (Signed)
PA has been submitted and will be updated in additional encounter.

## 2022-06-23 NOTE — Telephone Encounter (Signed)
PA request received via patient request for Ozempic (0.25 or 0.5 MG/DOSE) '2MG'$ /3ML pen-injectors   PA has been submitted and faxed to patients insurance ([employer]) through fax in [facility].  PA awaiting determination.  Key: [id number]

## 2022-06-29 ENCOUNTER — Encounter: Payer: Self-pay | Admitting: Family Medicine

## 2022-07-01 NOTE — Telephone Encounter (Signed)
Faxed PA form to [employer] at [phone] 07/01/2022

## 2022-07-04 NOTE — Telephone Encounter (Signed)
Please transfer his prescriptions and let me know if you need me to sign any the orders.  Thanks.

## 2022-07-05 MED ORDER — BD PEN NEEDLE MINI U/F 31G X 5 MM MISC: 3 refills | Status: DC

## 2022-07-05 MED ORDER — OZEMPIC (0.25 OR 0.5 MG/DOSE) 2 MG/3ML ~~LOC~~ SOPN: PEN_INJECTOR | SUBCUTANEOUS | 8 refills | Status: DC

## 2022-07-05 MED ORDER — METFORMIN HCL 500 MG PO TABS: ORAL_TABLET | ORAL | 12 refills | Status: DC

## 2022-07-05 MED ORDER — CONTOUR NEXT TEST VI STRP: ORAL_STRIP | 12 refills | Status: DC

## 2022-07-05 MED ORDER — INSULIN GLARGINE-YFGN 100 UNIT/ML ~~LOC~~ SOPN: PEN_INJECTOR | SUBCUTANEOUS | 1 refills | Status: DC

## 2022-07-05 MED ORDER — MECLIZINE HCL 25 MG PO TABS: ORAL_TABLET | ORAL | 1 refills | Status: AC

## 2022-07-05 MED ORDER — ATORVASTATIN CALCIUM 80 MG PO TABS: 80.0000 mg | ORAL_TABLET | Freq: Every day | ORAL | 5 refills | Status: DC

## 2022-07-05 MED ORDER — OMEPRAZOLE 40 MG PO CPDR: 40.0000 mg | DELAYED_RELEASE_CAPSULE | Freq: Every day | ORAL | 1 refills | Status: DC

## 2022-07-05 MED ORDER — LISINOPRIL 2.5 MG PO TABS: 2.5000 mg | ORAL_TABLET | Freq: Every day | ORAL | 3 refills | Status: DC

## 2022-07-05 NOTE — Telephone Encounter (Signed)
Only ones I could not send in was diazapam and tramadol.  LOV- 04/01/22 NOV - 10/03/22 RF- Tramadol 04/06/22 #30/5         Diazepam 05/10/22 #30/0

## 2022-07-05 NOTE — Telephone Encounter (Signed)
Is there a determination on the ozempic yet?

## 2022-07-07 ENCOUNTER — Other Ambulatory Visit: Payer: Self-pay | Admitting: Family Medicine

## 2022-07-07 MED ORDER — TRAMADOL HCL 50 MG PO TABS: 50.0000 mg | ORAL_TABLET | Freq: Every day | ORAL | 1 refills | Status: AC | PRN

## 2022-07-07 MED ORDER — DIAZEPAM 5 MG PO TABS: 5.0000 mg | ORAL_TABLET | Freq: Three times a day (TID) | ORAL | 0 refills | Status: AC | PRN

## 2022-07-07 NOTE — Telephone Encounter (Signed)
Sent. Thanks.   

## 2022-07-11 ENCOUNTER — Other Ambulatory Visit (HOSPITAL_COMMUNITY): Payer: Self-pay

## 2022-07-11 NOTE — Telephone Encounter (Signed)
Called pt's ins to follow up. They never received the form from CoverMyMeds. I was able to put it and refax it along with the pt's office visit notes from October.   PA ref # [id number]. Hopefully we'll get a determination soon, before he needs his medication again.

## 2022-07-19 NOTE — Telephone Encounter (Signed)
Tried to call the number provided on the PA that was originally done but they could not find the patient in the system. After reviewing chart; looks like PA was done with wrong form in covermymeds. I restarted PA in covermymeds with correct form using information from patients insurance card. New Key: [id number]; waiting for determination.

## 2022-07-27 NOTE — Telephone Encounter (Signed)
Called number provided on PA to see what is going on with the PA. I was told that the insurance has not made a determination yet.

## 2022-07-28 ENCOUNTER — Other Ambulatory Visit (HOSPITAL_COMMUNITY): Payer: Self-pay

## 2022-08-05 ENCOUNTER — Other Ambulatory Visit: Payer: Self-pay | Admitting: Interventional Cardiology

## 2022-09-01 ENCOUNTER — Other Ambulatory Visit: Payer: Self-pay | Admitting: Family Medicine

## 2022-10-03 ENCOUNTER — Ambulatory Visit: Payer: PRIVATE HEALTH INSURANCE | Admitting: Family Medicine

## 2022-10-06 ENCOUNTER — Other Ambulatory Visit: Payer: Self-pay | Admitting: Family Medicine

## 2023-03-01 ENCOUNTER — Other Ambulatory Visit: Payer: Self-pay | Admitting: Family Medicine

## 2023-03-03 NOTE — Telephone Encounter (Signed)
Patient needs CPE scheduled for next month. Please call patient to schedule appt.

## 2023-03-06 NOTE — Telephone Encounter (Signed)
Patient scheduled.

## 2023-03-26 ENCOUNTER — Other Ambulatory Visit: Payer: Self-pay | Admitting: Family Medicine

## 2023-03-26 DIAGNOSIS — E1129 Type 2 diabetes mellitus with other diabetic kidney complication: Secondary | ICD-10-CM

## 2023-03-30 ENCOUNTER — Other Ambulatory Visit (INDEPENDENT_AMBULATORY_CARE_PROVIDER_SITE_OTHER): Payer: PRIVATE HEALTH INSURANCE

## 2023-03-30 DIAGNOSIS — R809 Proteinuria, unspecified: Secondary | ICD-10-CM | POA: Diagnosis not present

## 2023-03-30 DIAGNOSIS — E1129 Type 2 diabetes mellitus with other diabetic kidney complication: Secondary | ICD-10-CM | POA: Diagnosis not present

## 2023-03-30 LAB — HEMOGLOBIN A1C: Hgb A1c MFr Bld: 7.1 % — ABNORMAL HIGH (ref 4.6–6.5)

## 2023-03-30 LAB — COMPREHENSIVE METABOLIC PANEL
ALT: 27 U/L (ref 0–53)
AST: 23 U/L (ref 0–37)
Albumin: 4.1 g/dL (ref 3.5–5.2)
Alkaline Phosphatase: 75 U/L (ref 39–117)
BUN: 13 mg/dL (ref 6–23)
CO2: 30 meq/L (ref 19–32)
Calcium: 9.8 mg/dL (ref 8.4–10.5)
Chloride: 104 meq/L (ref 96–112)
Creatinine, Ser: 0.97 mg/dL (ref 0.40–1.50)
GFR: 82.7 mL/min (ref >60.00)
Glucose, Bld: 124 mg/dL — ABNORMAL HIGH (ref 70–99)
Potassium: 4.3 meq/L (ref 3.5–5.1)
Sodium: 142 meq/L (ref 135–145)
Total Bilirubin: 0.5 mg/dL (ref 0.2–1.2)
Total Protein: 6.3 g/dL (ref 6.0–8.3)

## 2023-03-30 LAB — LIPID PANEL
Cholesterol: 89 mg/dL (ref 0–200)
HDL: 20.5 mg/dL — ABNORMAL LOW (ref >39.00)
LDL Cholesterol: 37 mg/dL (ref 0–99)
NonHDL: 68.89
Total CHOL/HDL Ratio: 4
Triglycerides: 161 mg/dL — ABNORMAL HIGH (ref 0.0–149.0)
VLDL: 32.2 mg/dL (ref 0.0–40.0)

## 2023-03-30 LAB — MICROALBUMIN / CREATININE URINE RATIO
Creatinine,U: 197.5 mg/dL
Microalb Creat Ratio: 1.2 mg/g (ref 0.0–30.0)
Microalb, Ur: 2.4 mg/dL — ABNORMAL HIGH (ref 0.0–1.9)

## 2023-03-30 LAB — PSA: PSA: 0.44 ng/mL (ref 0.10–4.00)

## 2023-04-06 ENCOUNTER — Encounter: Payer: Self-pay | Admitting: Family Medicine

## 2023-04-06 ENCOUNTER — Ambulatory Visit: Payer: PRIVATE HEALTH INSURANCE | Admitting: Family Medicine

## 2023-04-06 VITALS — BP 138/78 | HR 72 | Temp 98.5°F | Ht 71.0 in | Wt 248.6 lb

## 2023-04-06 DIAGNOSIS — G4733 Obstructive sleep apnea (adult) (pediatric): Secondary | ICD-10-CM

## 2023-04-06 DIAGNOSIS — Z7189 Other specified counseling: Secondary | ICD-10-CM

## 2023-04-06 DIAGNOSIS — Z0000 Encounter for general adult medical examination without abnormal findings: Secondary | ICD-10-CM

## 2023-04-06 DIAGNOSIS — E785 Hyperlipidemia, unspecified: Secondary | ICD-10-CM

## 2023-04-06 DIAGNOSIS — Z23 Encounter for immunization: Secondary | ICD-10-CM | POA: Diagnosis not present

## 2023-04-06 DIAGNOSIS — E1129 Type 2 diabetes mellitus with other diabetic kidney complication: Secondary | ICD-10-CM

## 2023-04-06 DIAGNOSIS — L989 Disorder of the skin and subcutaneous tissue, unspecified: Secondary | ICD-10-CM

## 2023-04-06 DIAGNOSIS — Z91018 Allergy to other foods: Secondary | ICD-10-CM

## 2023-04-06 MED ORDER — OZEMPIC (0.25 OR 0.5 MG/DOSE) 2 MG/3ML ~~LOC~~ SOPN: PEN_INJECTOR | SUBCUTANEOUS | Status: DC

## 2023-04-06 MED ORDER — ATORVASTATIN CALCIUM 80 MG PO TABS: 40.0000 mg | ORAL_TABLET | Freq: Every day | ORAL | Status: DC

## 2023-04-06 MED ORDER — ZALEPLON 5 MG PO CAPS: 5.0000 mg | ORAL_CAPSULE | Freq: Every evening | ORAL | 5 refills | Status: AC | PRN

## 2023-04-06 MED ORDER — INSULIN GLARGINE-YFGN 100 UNIT/ML ~~LOC~~ SOPN: PEN_INJECTOR | SUBCUTANEOUS | Status: DC

## 2023-04-06 NOTE — Assessment & Plan Note (Signed)
Discussed decreasing atorvastatin to 40 mg to see if his nocturnal cramps improved.  He can update me in 2 weeks.

## 2023-04-06 NOTE — Assessment & Plan Note (Signed)
Still using CPAP.  Insomnia hx noted with rare use of sonata.  He needed refill since his rx was out of date.   Refill sent.

## 2023-04-06 NOTE — Assessment & Plan Note (Signed)
Fortunately not having symptoms now with eating mammalian meat but routine cautions discussed with patient.

## 2023-04-06 NOTE — Progress Notes (Signed)
CPE- See plan.  Routine anticipatory guidance given to patient.  See health maintenance.  The possibility exists that previously documented standard health maintenance information may have been brought forward from a previous encounter into this note.  If needed, that same information has been updated to reflect the current situation based on today's encounter.    Tetanus 2022 Flu 2024 PNA 2014 Shingles prev done covid vaccine prev done Colon cancer screening- colonoscopy 2023 PSA 2024 Living will d/w pt. Would have his wife designated if patient were incapacitated.    Diet and exercise d/w pt.  Working on both.   Lung cancer screening d/w pt.  he should get a call about that.  He'll update me as needed.    H/o alpha gal but able to eat mammalian meat now.  D/w pt about routine cautions.    Still using CPAP.  Insomnia hx noted with rare use of sonata.  He needed refill since his rx was out of date.    Diabetes:  Using medications without difficulties: yes Hypoglycemic episodes: rarely down to 80s, cautions d/w pt.   Hyperglycemic episodes:no Feet problems: no Blood Sugars averaging: usually ~110 eye exam within last year: Dec appetite on ozempic but no ADE on med.   80 units insulin, 1000mg  metformin BID.   Labs discussed with patient.  Elevated Cholesterol: Using medications without problems:yes Muscle aches: some cramps in the legs at night.  Pickle juice helped. No claudication.   Diet compliance: d/w pt.  Exercise: d/w pt.    He had recent URI with some residual cough.  Overall improving.  I asked him to update me as needed.  Lungs are still clear.  PMH and SH reviewed  Meds, vitals, and allergies reviewed.   ROS: Per HPI.  Unless specifically indicated otherwise in HPI, the patient denies:  General: fever. Eyes: acute vision changes ENT: sore throat Cardiovascular: chest pain Respiratory: SOB GI: vomiting GU: dysuria Musculoskeletal: acute back pain Derm: acute  rash Neuro: acute motor dysfunction Psych: worsening mood Endocrine: polydipsia Heme: bleeding Allergy: hayfever  GEN: nad, alert and oriented HEENT: ncat NECK: supple w/o LA CV: rrr. PULM: ctab, no inc wob ABD: soft, +bs EXT: no edema SKIN: well perfused.  8mm papule on the R forearm, warty lesion vs SK.  No ulceration.    Diabetic foot exam: Normal inspection No skin breakdown No calluses  Normal DP pulses Normal sensation to light touch and monofilament Nails normal

## 2023-04-06 NOTE — Assessment & Plan Note (Signed)
Dec appetite on ozempic but no ADE on med.   80 units insulin, 1000mg  metformin BID.    Would continue as is for now. We may need to dec insulin and inc ozempic in the future.   I would make 1 change at a time, that being the change in his statin dose. Continue work on diet and exercise. Recheck A1c at a visit in about 4 months.

## 2023-04-06 NOTE — Assessment & Plan Note (Signed)
Living will d/w pt. Would have his wife designated if patient were incapacitated.

## 2023-04-06 NOTE — Patient Instructions (Addendum)
Try taking 40mg  atorvastatin and see if the aches get better.  Try for 2 weeks and the update me.  We may need to change insulin vs ozempic in the future.   Recheck A1c at a visit in about 4 months.  Take care.  Glad to see you.  Keep the spot on your arm clean and covered.  Update me if it doesn't blister and then heal over.

## 2023-04-06 NOTE — Assessment & Plan Note (Signed)
Tetanus 2022 Flu 2024 PNA 2014 Shingles prev done covid vaccine prev done Colon cancer screening- colonoscopy 2023 PSA 2024 Living will d/w pt. Would have his wife designated if patient were incapacitated.    Diet and exercise d/w pt.  Working on both.   Lung cancer screening d/w pt.  he should get a call about that.  He'll update me as needed.

## 2023-04-06 NOTE — Assessment & Plan Note (Signed)
He has not 8 mm papule on the right forearm that gets recurrently irritated.  He has shaved the top of the lesion off before.  It looks like it could be either a warty lesion versus a seborrheic keratosis.  It does not have atypical features concerning for melanoma.  It does not have ulceration or rolled border/telangiectasia.  It looks like a recurrently irritated benign lesion.  Discussed options.  He opted for liquid nitrogen treatment.  He consented.  Frozen x 3 at the office visit with a post cryotherapy instructions discussed with patient.  He can update me as needed.

## 2023-04-20 ENCOUNTER — Other Ambulatory Visit: Payer: Self-pay | Admitting: Family Medicine

## 2023-04-20 ENCOUNTER — Encounter: Payer: Self-pay | Admitting: Family Medicine

## 2023-04-20 MED ORDER — ATORVASTATIN CALCIUM 40 MG PO TABS: 40.0000 mg | ORAL_TABLET | Freq: Every day | ORAL | 3 refills | Status: DC

## 2023-04-24 ENCOUNTER — Other Ambulatory Visit: Payer: Self-pay | Admitting: Family Medicine

## 2023-04-29 ENCOUNTER — Other Ambulatory Visit: Payer: Self-pay | Admitting: Family Medicine

## 2023-05-10 ENCOUNTER — Other Ambulatory Visit: Payer: Self-pay | Admitting: Family Medicine

## 2023-05-16 ENCOUNTER — Other Ambulatory Visit: Payer: Self-pay | Admitting: Acute Care

## 2023-05-16 DIAGNOSIS — Z122 Encounter for screening for malignant neoplasm of respiratory organs: Secondary | ICD-10-CM

## 2023-05-16 DIAGNOSIS — Z87891 Personal history of nicotine dependence: Secondary | ICD-10-CM

## 2023-05-26 LAB — HM DIABETES EYE EXAM

## 2023-05-29 ENCOUNTER — Other Ambulatory Visit: Payer: Self-pay

## 2023-05-29 ENCOUNTER — Encounter: Payer: Self-pay | Admitting: Family Medicine

## 2023-05-31 ENCOUNTER — Other Ambulatory Visit: Payer: Self-pay | Admitting: Family Medicine

## 2023-05-31 MED ORDER — OZEMPIC (0.25 OR 0.5 MG/DOSE) 2 MG/3ML ~~LOC~~ SOPN: 0.5000 mg | PEN_INJECTOR | SUBCUTANEOUS | 3 refills | Status: DC

## 2023-05-31 NOTE — Progress Notes (Signed)
Please make sure his Ozempic prescription printed.  Please pull it for me to sign and then fax in.  Please see his MyChart message.  Thanks.

## 2023-06-20 ENCOUNTER — Other Ambulatory Visit: Payer: PRIVATE HEALTH INSURANCE

## 2023-06-26 ENCOUNTER — Other Ambulatory Visit: Payer: Self-pay | Admitting: Family Medicine

## 2023-07-26 ENCOUNTER — Other Ambulatory Visit: Payer: Self-pay | Admitting: Family Medicine

## 2023-08-08 ENCOUNTER — Encounter: Payer: Self-pay | Admitting: Family Medicine

## 2023-08-08 ENCOUNTER — Ambulatory Visit (INDEPENDENT_AMBULATORY_CARE_PROVIDER_SITE_OTHER)
Admission: RE | Admit: 2023-08-08 | Discharge: 2023-08-08 | Disposition: A | Discharge status: Home / Self Care | Payer: PRIVATE HEALTH INSURANCE | Source: Ambulatory Visit | Attending: Family Medicine | Admitting: Family Medicine

## 2023-08-08 ENCOUNTER — Ambulatory Visit: Payer: PRIVATE HEALTH INSURANCE | Admitting: Family Medicine

## 2023-08-08 VITALS — BP 128/72 | HR 96 | Temp 97.9°F | Ht 71.0 in | Wt 246.0 lb

## 2023-08-08 DIAGNOSIS — R042 Hemoptysis: Secondary | ICD-10-CM

## 2023-08-08 DIAGNOSIS — J069 Acute upper respiratory infection, unspecified: Secondary | ICD-10-CM

## 2023-08-08 MED ORDER — AZITHROMYCIN 250 MG PO TABS: ORAL_TABLET | ORAL | 0 refills | Status: DC

## 2023-08-08 MED ORDER — BENZONATATE 200 MG PO CAPS: 200.0000 mg | ORAL_CAPSULE | Freq: Three times a day (TID) | ORAL | 1 refills | Status: DC | PRN

## 2023-08-08 MED ORDER — PREDNISONE 10 MG PO TABS: ORAL_TABLET | ORAL | 0 refills | Status: DC

## 2023-08-08 NOTE — Assessment & Plan Note (Addendum)
 Week 3  Now some hemoptysis in former smoker  Some congestion heard at right base on exam (has had discomfort in this area)  Reported wheezing- mild on exam and normal pulse ox  Cxr : mild peribronchial thickening but no infiltrate   Sent tessalon for cough  Recommend guiaf plus dm over the counter for cough and congestion  Prednisone 30 mg taper (will make glucose go up) Zpak in light of risk factors  Update if not starting to improve in a week or if worsening  Call back and Er precautions noted in detail today

## 2023-08-08 NOTE — Progress Notes (Signed)
 Subjective:    Patient ID: Edward Wright, male    DOB: 1960-10-23, 63 y.o.   MRN: [id number]  HPI  Wt Readings from Last 3 Encounters:  08/08/23 246 lb (111.6 kg)  04/06/23 248 lb 9.6 oz (112.8 kg)  05/31/22 255 lb 12.8 oz (116 kg)   34.31 kg/m  Vitals:   08/08/23 1214  BP: 128/72  Pulse: 96  Temp: 97.9 F (36.6 C)  SpO2: 99%    63 yo pt of Dr Para March presents with 3 weeks of cough and congestion  He has history of lung nodule and DM Former smoker / quit in 2020   Coughed up some blood on fri/sat  Just tiny bit/specks  Not on asa or blood thinners  Clear phlegm  Little wheezing  No shortness of breath  Some pain in right mid to low back -worse with coughing   Runny nose on and off  Occational congestion -not bad  No nasal sprays   May have had fever early on -woke up with chills   Some sore throat - worse on right side Ears are ok   Did not do a covid test   CT chest 05/2022 for lung cancer screen  IMPRESSION: 1. Lung-RADS 1, negative. Continue annual screening with low-dose chest CT without contrast in 12 months. 2. Hepatic steatosis. 3. Aortic Atherosclerosis (ICD10-I70.0) and Emphysema (ICD10-J43.9). Coronary artery atherosclerosis.  Over the counter Over the counter day and night multi symptom   Cxr today  DG Chest 2 View Result Date: 08/08/2023 CLINICAL DATA:  Cough for the past 3 weeks with some hemoptysis for the past week. Ex-smoker. EXAM: CHEST - 2 VIEW COMPARISON:  Chest CT dated 06/10/2022 FINDINGS: Normal sized heart. Clear lungs with normal vascularity. Mild peribronchial thickening. Thoracic spine degenerative changes. IMPRESSION: Mild bronchitic changes. Electronically Signed   By: Beckie Salts M.D.   On: 08/08/2023 15:36     Patient Active Problem List   Diagnosis Date Noted   Hemoptysis 08/08/2023   Decreased exercise tolerance 03/18/2021   Morbid (severe) obesity due to excess calories (HCC) 04/04/2019   Lung nodule  04/04/2019   Allergy to alpha-gal 03/03/2019   Skin rash 02/27/2019   Hives 01/06/2019   Skin lesion 07/04/2016   Vertigo 08/20/2015   Actinic keratosis 12/24/2014   Finger pain 12/24/2014   Cough syncope 03/28/2014   Dysphagia 12/18/2013   Advance care planning 12/18/2013   Radicular pain in left arm 02/15/2013   Plantar fasciitis 05/25/2012   Insomnia 09/01/2011   Routine general medical examination at a health care facility 09/01/2011   Viral URI with cough 05/26/2011   Back pain 01/05/2011   KNEE PAIN, RIGHT 07/07/2010   Diabetes mellitus with microalbuminuria (HCC) 11/20/2006   HLD (hyperlipidemia) 11/20/2006   GERD 11/20/2006   BENIGN PROSTATIC HYPERTROPHY 11/20/2006   Obstructive sleep apnea 11/20/2006   Past Medical History:  Diagnosis Date   Bell's palsy    L sided with mild persistent lip droop   BPH (benign prostatic hyperplasia) 07/1997   Colon polyps    adenomatous   Diabetes mellitus type II 01/2003   Diverticulosis    GERD (gastroesophageal reflux disease) 11/2002   Heart murmur    history of   Hyperlipemia 04/1989   Internal hemorrhoids    Peptic stricture of esophagus    Sleep apnea    sleep study pos on CPAP   Spermatocele    Past Surgical History:  Procedure Laterality Date   CHOLECYSTECTOMY  01/05/2004   COLONOSCOPY     CYSTECTOMY Right 06/20/1988   ganglion wrist   DOPPLER ECHOCARDIOGRAPHY  01/13/2004   normal   hematuria  12/19/1994   w/u neg (urol)   UPPER GASTROINTESTINAL ENDOSCOPY     VASECTOMY  05/26/2006   Social History   Tobacco Use   Smoking status: Former    Current packs/day: 0.00    Average packs/day: 1 pack/day for 10.0 years (10.0 ttl pk-yrs)    Types: Cigarettes    Start date: 03/13/2009    Quit date: 03/14/2019    Years since quitting: 4.4   Smokeless tobacco: Never   Tobacco comments:    recently quit smoking completely   Vaping Use   Vaping status: Never Used  Substance Use Topics   Alcohol use: Yes     Alcohol/week: 0.0 standard drinks of alcohol    Comment: 1 beer a month   Drug use: No   Family History  Problem Relation Age of Onset   Hypertension Mother    Heart disease Mother        CAD, MI, stents x 2-3   Transient ischemic attack Mother    Dementia Mother    Cancer Father        multiple myeloma   Diabetes Paternal Grandfather    Cancer Other        unknown type   Colon cancer Neg Hx    Prostate cancer Neg Hx    Esophageal cancer Neg Hx    Rectal cancer Neg Hx    Stomach cancer Neg Hx    Allergies  Allergen Reactions   Atorvastatin     Max tolerated dose is 40mg .     Hydrocodone Nausea Only    Sweating   Jardiance [Empagliflozin]     Yeast infections   Meat [Alpha-Gal]     Avoid mammalian meat   Current Outpatient Medications on File Prior to Visit  Medication Sig Dispense Refill   atorvastatin (LIPITOR) 40 MG tablet Take 1 tablet (40 mg total) by mouth daily. 90 tablet 3   Blood Glucose Monitoring Suppl (CONTOUR NEXT ONE) DEVI 1 Device by Does not apply route as needed. Use to check blood sugar twice daily and as directed.  Insulin Dependent.  Dx:  E11.9 1 each 0   clotrimazole (LOTRIMIN) 1 % cream APPLY TOPICALLY 2 (TWO) TIMES DAILY.AS NEEDED     diazepam (VALIUM) 5 MG tablet Take 1 tablet (5 mg total) by mouth 3 (three) times daily as needed. 30 tablet 0   glucose blood (CONTOUR NEXT TEST) test strip Use as instructed 100 strip 12   hydrocortisone (ANUSOL-HC) 25 MG suppository UNWRAP AND PLACE 1 SUPPOSITORY (25 MG TOTAL) RECTALLY 3 (THREE) TIMES DAILY AS NEEDED. 12 suppository 2   insulin glargine-yfgn (SEMGLEE) 100 UNIT/ML Pen INJECT UP TO 100 UNITS INTO THE SKIN ONCE DAILY 30 mL 1   Insulin Pen Needle (B-D UF III MINI PEN NEEDLES) 31G X 5 MM MISC USE DAILY WITH INSULIN PEN. Dx E11.9 100 each 3   lisinopril (ZESTRIL) 2.5 MG tablet Take 1 tablet (2.5 mg total) by mouth daily. 90 tablet 3   meclizine (ANTIVERT) 25 MG tablet TAKE 1 TABLET BY MOUTH 3 TIMES DAILY AS  NEEDED FOR DIZZINESS. 90 tablet 1   metFORMIN (GLUCOPHAGE) 500 MG tablet TAKE TWO TABLETS BY MOUTH TWICE A DAY WITH A MEAL 120 tablet 3   NON FORMULARY CPAP 13 Advanced, use as directed     omeprazole (PRILOSEC)  40 MG capsule TAKE ONE CAPSULE BY MOUTH ONCE DAILY 30 capsule 11   Semaglutide,0.25 or 0.5MG /DOS, (OZEMPIC, 0.25 OR 0.5 MG/DOSE,) 2 MG/3ML SOPN Inject 0.5 mg as directed once a week. 9 mL 3   traMADol (ULTRAM) 50 MG tablet Take 1 tablet (50 mg total) by mouth daily as needed. 30 tablet 1   zaleplon (SONATA) 5 MG capsule Take 1 capsule (5 mg total) by mouth at bedtime as needed for sleep. 30 capsule 5   No current facility-administered medications on file prior to visit.    Review of Systems  Constitutional:  Positive for appetite change and fatigue. Negative for fever.  HENT:  Positive for congestion, postnasal drip, rhinorrhea, sinus pressure, sneezing and sore throat. Negative for ear pain.   Eyes:  Negative for pain and discharge.  Respiratory:  Positive for cough, chest tightness and wheezing. Negative for shortness of breath and stridor.   Cardiovascular:  Negative for chest pain.  Gastrointestinal:  Negative for diarrhea, nausea and vomiting.  Genitourinary:  Negative for frequency, hematuria and urgency.  Musculoskeletal:  Negative for arthralgias and myalgias.  Skin:  Negative for rash.  Neurological:  Negative for dizziness, weakness, light-headedness and headaches.  Psychiatric/Behavioral:  Negative for confusion and dysphoric mood.        Objective:   Physical Exam Constitutional:      General: He is not in acute distress.    Appearance: Normal appearance. He is well-developed. He is obese. He is not ill-appearing, toxic-appearing or diaphoretic.  HENT:     Head: Normocephalic and atraumatic.     Comments: No facial tenderness or swelling    Right Ear: Tympanic membrane, ear canal and external ear normal.     Left Ear: Tympanic membrane, ear canal and external  ear normal.     Nose: Congestion and rhinorrhea present.     Mouth/Throat:     Mouth: Mucous membranes are moist.     Pharynx: Oropharynx is clear. No oropharyngeal exudate or posterior oropharyngeal erythema.     Comments: Clear pnd  Eyes:     General:        Right eye: No discharge.        Left eye: No discharge.     Conjunctiva/sclera: Conjunctivae normal.     Pupils: Pupils are equal, round, and reactive to light.  Cardiovascular:     Rate and Rhythm: Tachycardia present.     Heart sounds: Normal heart sounds.  Pulmonary:     Effort: Pulmonary effort is normal. No respiratory distress.     Breath sounds: No stridor. Wheezing and rales present. No rhonchi.     Comments: Scant wheeze only at end of forced exp  Mildly distant bs Scant rales/crackles at right base   Mild chest wall pain right lower posterior  Chest:     Chest wall: Tenderness present.  Musculoskeletal:     Cervical back: Normal range of motion and neck supple.  Lymphadenopathy:     Cervical: No cervical adenopathy.  Skin:    General: Skin is warm and dry.     Capillary Refill: Capillary refill takes less than 2 seconds.     Findings: No rash.  Neurological:     Mental Status: He is alert.     Cranial Nerves: No cranial nerve deficit.  Psychiatric:        Mood and Affect: Mood normal.           Assessment & Plan:   Problem List Items Addressed  This Visit       Respiratory   Viral URI with cough - Primary   Week 3  Now some hemoptysis in former smoker  Some congestion heard at right base on exam (has had discomfort in this area)  Reported wheezing- mild on exam and normal pulse ox  Cxr : mild peribronchial thickening but no infiltrate   Sent tessalon for cough  Recommend guiaf plus dm over the counter for cough and congestion  Prednisone 30 mg taper (will make glucose go up) Zpak in light of risk factors  Update if not starting to improve in a week or if worsening  Call back and Er  precautions noted in detail today        Relevant Medications   azithromycin (ZITHROMAX Z-PAK) 250 MG tablet   Other Relevant Orders   DG Chest 2 View (Completed)     Other   Hemoptysis   After cough 3 wk with uri /bronchitis  Former smoker  Some rales in right base on exam  Reviewed CT 2023 for lung cancer screening- was normal  Cxr : peribronchial thickening without other lung findings    Tessalon sent to pharmacy Encouraged pt to schedule his routine CT for lung cancer screening  Call back and Er precautions noted in detail today        Relevant Orders   DG Chest 2 View (Completed)

## 2023-08-08 NOTE — Assessment & Plan Note (Addendum)
 After cough 3 wk with [place] Peers /bronchitis  Former smoker  Some rales in right base on exam  Reviewed CT 2023 for lung cancer screening- was normal  Cxr : peribronchial thickening without other lung findings    Tessalon sent to pharmacy Encouraged pt to schedule his routine CT for lung cancer screening  Call back and Er precautions noted in detail today

## 2023-08-08 NOTE — Patient Instructions (Addendum)
 Chest xray now  We will make a plan based on result   We will contact you with result as soon as it comes back    Consider changing medication to mucinex dm (helps both cough and phlegm)  Try a warm compress on your back  For cough you can try the tessalon pearles   Stay well hydrated

## 2023-08-25 ENCOUNTER — Ambulatory Visit
Admission: RE | Admit: 2023-08-25 | Discharge: 2023-08-25 | Disposition: A | Discharge status: Home / Self Care | Payer: 59 | Source: Ambulatory Visit | Attending: Acute Care | Admitting: Acute Care

## 2023-08-25 DIAGNOSIS — Z122 Encounter for screening for malignant neoplasm of respiratory organs: Secondary | ICD-10-CM

## 2023-08-25 DIAGNOSIS — Z87891 Personal history of nicotine dependence: Secondary | ICD-10-CM

## 2023-09-11 ENCOUNTER — Telehealth: Payer: Self-pay

## 2023-09-11 MED ORDER — BASAGLAR KWIKPEN 100 UNIT/ML ~~LOC~~ SOPN: PEN_INJECTOR | SUBCUTANEOUS | 3 refills | Status: DC

## 2023-09-11 NOTE — Telephone Encounter (Signed)
 Rx sent electronically for basaglar.  Please see about schedule OV when possible.  We can do A1c at the OV.

## 2023-09-11 NOTE — Telephone Encounter (Signed)
 Noted. Thanks.

## 2023-09-11 NOTE — Telephone Encounter (Signed)
 Patient has been scheduled 4/24

## 2023-09-11 NOTE — Telephone Encounter (Signed)
 Called pt and schedule appt for A1c

## 2023-09-11 NOTE — Telephone Encounter (Signed)
 Called patient to confirm that his rx vendor is actually [facility] and he did advise yes. His rx paperwork has been sent to provider to fill

## 2023-09-11 NOTE — Telephone Encounter (Signed)
 Please call patient to schedule an office visit to check his A1C at visit. At lease in the next 1-2 months please.

## 2023-09-15 ENCOUNTER — Ambulatory Visit (INDEPENDENT_AMBULATORY_CARE_PROVIDER_SITE_OTHER)
Admission: RE | Admit: 2023-09-15 | Discharge: 2023-09-15 | Disposition: A | Discharge status: Home / Self Care | Source: Ambulatory Visit | Attending: General Practice | Admitting: General Practice

## 2023-09-15 ENCOUNTER — Encounter: Payer: Self-pay | Admitting: General Practice

## 2023-09-15 ENCOUNTER — Ambulatory Visit: Admitting: General Practice

## 2023-09-15 VITALS — BP 124/62 | HR 106 | Temp 98.6°F | Ht 71.0 in | Wt 250.0 lb

## 2023-09-15 DIAGNOSIS — M545 Low back pain, unspecified: Secondary | ICD-10-CM

## 2023-09-15 MED ORDER — PREDNISONE 10 MG (21) PO TBPK: ORAL_TABLET | ORAL | 0 refills | Status: DC

## 2023-09-15 NOTE — Patient Instructions (Addendum)
 Complete xray(s) prior to leaving today. I will notify you of your results once received.  Prednisone has been sent to the pharmacy. Please do not pick up until you have heard back from me regarding your x-ray results.   Recommend tylenol 500 mg and ibuprofen 600 mg together for pain every 6-8 hours.  Heat to site, lidocaine patches as needed.  Exercises to area as tolerated.  Follow up in 2 weeks with PCP or sooner if needed.  It was a pleasure meeting you!

## 2023-09-15 NOTE — Progress Notes (Signed)
 Established Patient Office Visit  Subjective   Patient ID: Edward Wright, male    DOB: [birth year]  Age: 63 y.o. MRN: [id number]  Chief Complaint  Patient presents with   Back Pain    Into b/l hips into legs and feet. Left leg is outside of leg unto foot. Has has history of back pain. Medications given in the past for back is not helping. Pain can be sharp and dull and throbbing depending on location 7/10 pain.      HPI  Edward Wright is a 63 year old male, patient of Dr. Para March, with past medical history of OSA, GERD, DM, plantar fascitis, HLD, chronic back pain, morbid obesity, presents today for an acute visit.   Low back pain: symptom onset three weeks on right lower back. He started favoring the left side and now for the past week the pain is now located on left lower back. Pain is radiating down the back of his legs. Now his left foot is swollen. He has tried tramadol and ibuprofen with some relief. He has not fallen or any known injury. He describes his pain as burning, aching, constant pain. It does ease off when he takes the medication or with walking.   Patient Active Problem List   Diagnosis Date Noted   Hemoptysis 08/08/2023   Decreased exercise tolerance 03/18/2021   Morbid (severe) obesity due to excess calories (HCC) 04/04/2019   Lung nodule 04/04/2019   Allergy to alpha-gal 03/03/2019   Skin rash 02/27/2019   Hives 01/06/2019   Skin lesion 07/04/2016   Vertigo 08/20/2015   Actinic keratosis 12/24/2014   Finger pain 12/24/2014   Cough syncope 03/28/2014   Dysphagia 12/18/2013   Advance care planning 12/18/2013   Radicular pain in left arm 02/15/2013   Plantar fasciitis 05/25/2012   Insomnia 09/01/2011   Routine general medical examination at a health care facility 09/01/2011   Viral URI with cough 05/26/2011   Back pain 01/05/2011   KNEE PAIN, RIGHT 07/07/2010   Diabetes mellitus with microalbuminuria (HCC) 11/20/2006   HLD (hyperlipidemia)  11/20/2006   GERD 11/20/2006   BENIGN PROSTATIC HYPERTROPHY 11/20/2006   Obstructive sleep apnea 11/20/2006   Past Medical History:  Diagnosis Date   Bell's palsy    L sided with mild persistent lip droop   BPH (benign prostatic hyperplasia) 07/1997   Colon polyps    adenomatous   Diabetes mellitus type II 01/2003   Diverticulosis    GERD (gastroesophageal reflux disease) 11/2002   Heart murmur    history of   Hyperlipemia 04/1989   Internal hemorrhoids    Peptic stricture of esophagus    Sleep apnea    sleep study pos on CPAP   Spermatocele    Past Surgical History:  Procedure Laterality Date   CHOLECYSTECTOMY  01/05/2004   COLONOSCOPY     CYSTECTOMY Right 06/20/1988   ganglion wrist   DOPPLER ECHOCARDIOGRAPHY  01/13/2004   normal   hematuria  12/19/1994   w/u neg (urol)   UPPER GASTROINTESTINAL ENDOSCOPY     VASECTOMY  05/26/2006   Allergies  Allergen Reactions   Atorvastatin     Max tolerated dose is 40mg .     Hydrocodone Nausea Only    Sweating   Jardiance [Empagliflozin]     Yeast infections   Meat [Alpha-Gal]     Avoid mammalian meat         09/15/2023    2:29 PM 04/06/2023    3:58  PM 12/30/2021    3:40 PM  Depression screen PHQ 2/9  Decreased Interest 0 0 0  Down, Depressed, Hopeless 0 0 0  PHQ - 2 Score 0 0 0  Altered sleeping 3 2   Tired, decreased energy 2 1   Change in appetite 1 0   Feeling bad or failure about yourself  0 0   Trouble concentrating 0 0   Moving slowly or fidgety/restless 0 0   Suicidal thoughts 0 0   PHQ-9 Score 6 3   Difficult doing work/chores Not difficult at all Not difficult at all        04/06/2023    3:59 PM  GAD 7 : Generalized Anxiety Score  Nervous, Anxious, on Edge 0  Control/stop worrying 1  Worry too much - different things 1  Trouble relaxing 0  Restless 1  Easily annoyed or irritable 0  Afraid - awful might happen 0  Total GAD 7 Score 3  Anxiety Difficulty Not difficult at all      Review of  Systems  Constitutional:  Negative for chills and fever.  Respiratory:  Negative for shortness of breath.   Cardiovascular:  Negative for chest pain.  Gastrointestinal:  Negative for abdominal pain, constipation, diarrhea, heartburn, nausea and vomiting.  Genitourinary:  Negative for dysuria, frequency and urgency.  Musculoskeletal:  Positive for back pain.  Neurological:  Negative for dizziness and headaches.  Endo/Heme/Allergies:  Negative for polydipsia.  Psychiatric/Behavioral:  Negative for depression and suicidal ideas. The patient is not nervous/anxious.       Objective:     BP 124/62   Pulse (!) 106   Temp 98.6 F (37 C) (Oral)   Ht 5\' 11"  (1.803 m)   Wt 250 lb (113.4 kg)   SpO2 98%   BMI 34.87 kg/m  BP Readings from Last 3 Encounters:  09/15/23 124/62  08/08/23 128/72  04/06/23 138/78   Wt Readings from Last 3 Encounters:  09/15/23 250 lb (113.4 kg)  08/08/23 246 lb (111.6 kg)  04/06/23 248 lb 9.6 oz (112.8 kg)      Physical Exam Vitals and nursing note reviewed.  Constitutional:      Appearance: Normal appearance.  Cardiovascular:     Rate and Rhythm: Normal rate and regular rhythm.     Pulses:          Posterior tibial pulses are 2+ on the right side and 1+ on the left side.     Heart sounds: Normal heart sounds.  Pulmonary:     Effort: Pulmonary effort is normal.     Breath sounds: Normal breath sounds.  Musculoskeletal:        General: Tenderness present.  Feet:     Left foot:     Skin integrity: No ulcer, blister, skin breakdown, erythema or warmth.     Comments: Left foot swelling present on exam. Neurological:     Mental Status: He is alert and oriented to person, place, and time.  Psychiatric:        Mood and Affect: Mood normal.        Behavior: Behavior normal.        Thought Content: Thought content normal.        Judgment: Judgment normal.      No results found for any visits on 09/15/23.     The ASCVD Risk score (Arnett DK,  et al., 2019) failed to calculate for the following reasons:   The valid total cholesterol range is  130 to 320 mg/dL    Assessment & Plan:  Acute bilateral low back pain, unspecified whether sciatica present Assessment & Plan: Straight leg test positive on right side.   STAT lumbar x-ray pending.   Prednisone taper sent to the pharmacy.   Continue tramadol as needed.   Follow up with PCP in two weeks or sooner if needed.   Orders: -     DG Lumbar Spine Complete -     predniSONE; Please follow instructions on the pack.  Dispense: 21 tablet; Refill: 0    Return in 2 weeks (on 09/29/2023), or if symptoms worsen or fail to improve.    Modesto Charon, NP

## 2023-09-15 NOTE — Assessment & Plan Note (Signed)
 Straight leg test positive on right side.   STAT lumbar x-ray pending.   Prednisone taper sent to the pharmacy.   Continue tramadol as needed.   Follow up with PCP in two weeks or sooner if needed.

## 2023-09-18 ENCOUNTER — Encounter: Payer: Self-pay | Admitting: General Practice

## 2023-09-21 ENCOUNTER — Telehealth: Payer: Self-pay

## 2023-09-21 DIAGNOSIS — R911 Solitary pulmonary nodule: Secondary | ICD-10-CM

## 2023-09-21 NOTE — Telephone Encounter (Signed)
 Called and spoke with patient. Advises he was sick and started treatment for bronchitis on 08/08/2023. Pt states all symptoms have resolved now. Reviewed Aortic atherosclerosis, coronary artery Atherosclerosis, Hepatic steatosis and emphysema. Pt states he is on statin therapy. Advised patient we will call him back to schedule his repeat CT once provider looks at scan and advises on repeat date.    Call Report From Tiffany:   IMPRESSION: Lung-RADS 0, incomplete. Additional lung cancer screening CT images/or comparison to prior chest CT examinations is needed. Right lower lobe airspace and ground-glass opacity, favored to represent interval infection or less likely atelectasis. Neoplastic process such as adenocarcinoma felt less likely. Consider antibiotic therapy with repeat lung cancer screening CT at 6-12 weeks.   Aortic atherosclerosis (ICD10-I70.0), coronary artery atherosclerosis and emphysema (ICD10-J43.9).   Hepatic steatosis

## 2023-09-22 ENCOUNTER — Other Ambulatory Visit: Payer: Self-pay | Admitting: Family Medicine

## 2023-09-25 ENCOUNTER — Telehealth: Payer: Self-pay

## 2023-09-25 NOTE — Telephone Encounter (Signed)
 Copied from CRM [id number]. Topic: Clinical - Lab/Test Results >> Sep 22, 2023  9:09 AM Orinda Kenner C wrote: Reason for CRM: Diane from [facility] radiology [phone] just wanted to checked that NP, Groce received the results for patient CT chest lung 08/25/23 and radiologist read on 09/21/23.  Patient received results in previous encounter. NFN

## 2023-09-28 ENCOUNTER — Encounter: Payer: Self-pay | Admitting: Internal Medicine

## 2023-09-28 ENCOUNTER — Ambulatory Visit: Payer: PRIVATE HEALTH INSURANCE | Admitting: Internal Medicine

## 2023-09-28 VITALS — BP 136/68 | HR 90 | Temp 98.2°F | Ht 71.0 in | Wt 250.4 lb

## 2023-09-28 DIAGNOSIS — M549 Dorsalgia, unspecified: Secondary | ICD-10-CM | POA: Diagnosis not present

## 2023-09-28 DIAGNOSIS — R042 Hemoptysis: Secondary | ICD-10-CM | POA: Diagnosis not present

## 2023-09-28 DIAGNOSIS — Z87891 Personal history of nicotine dependence: Secondary | ICD-10-CM

## 2023-09-28 DIAGNOSIS — G4733 Obstructive sleep apnea (adult) (pediatric): Secondary | ICD-10-CM

## 2023-09-28 DIAGNOSIS — G4700 Insomnia, unspecified: Secondary | ICD-10-CM

## 2023-09-28 DIAGNOSIS — J40 Bronchitis, not specified as acute or chronic: Secondary | ICD-10-CM

## 2023-09-28 DIAGNOSIS — J181 Lobar pneumonia, unspecified organism: Secondary | ICD-10-CM

## 2023-09-28 NOTE — Patient Instructions (Addendum)
 Order- DME [employer]- please replace old CPAP machine, change to auto 10-20, mask of choice, heated humidifier, supplies, AirView/ card  Order- schedule low dose CT chest in 3 months- right lower lobe pneumonia, compare with 08/25/23

## 2023-09-28 NOTE — Progress Notes (Signed)
 HPI-  male former smoker followed for obstructive sleep apnea/CPAP, complicated by DM 2, muscle cramps NPSG was prior to 2009- not in EMR  --------------------------------------------------------------------------   04/25/22-  63 year old male former smoker followed for OSA/CPAP, Insomnia, complicated by DM 2, muscle cramps, GERD, Morbid Obesity, Covid infection 2020, 2023,  CPAP 14/[employer] Download- compliance 90%, AHI 1.6/ hr Body weight today-251 lbs Covid vax- 2 Phizer Flu vax-had Doing well, comfortable with CPAP. Asks printed script for supplies to order on-line due to cost. Covid infection last month. Aware of cardiac murmur- trivial AR on ECHO.  09/28/23- 63 year old male former smoker followed for OSA/CPAP, Insomnia, Abnrmal CT 4/25, complicated by DM 2, muscle cramps, GERD, Morbid Obesity, Covid infection 2020, 2023,  -Sonata  5,  CPAP 14/[employer] Download- compliance 100%, AHI 1.6/hr Body weight today-250 lbs  Discussed the use of AI scribe software for clinical note transcription with the patient, who gave verbal consent to proceed.  History of Present Illness   The patient, with a history of sleep apnea managed with CPAP, presents with back pain. He reports that his sleep is comfortable with the CPAP machine, but he has been experiencing back issues. He also had a bout of bronchitis, which he suspects was almost pneumonia, as he was coughing up blood. This occurred after Christmas, around February or March. He was treated with prednisone  for this. He also received prednisone  for his back issues. He reports that his CPAP machine is around 65 years old and he is considering replacing it. He also mentions that he has been using zaleplon  (Sonata ) as a sleep aid, which he finds helpful. He asks written script for replacement CPAP machine and supplies so he can shop around. We discussed  his screening CT from March, showing abnormality consistent with a pneumonia shorty before.  He agrees  to repeat CT.  History of Present Illness                                                                                     CT chest low dose cancer screen- 08/25/23 IMPRESSION: Lung-RADS 0, incomplete. Additional lung cancer screening CT images/or comparison to prior chest CT examinations is needed. Right lower lobe airspace and ground-glass opacity, favored to represent interval infection or less likely atelectasis. Neoplastic process such as adenocarcinoma felt less likely. Consider antibiotic therapy with repeat lung cancer screening CT at 6-12 weeks. Aortic atherosclerosis (ICD10-I70.0), coronary artery atherosclerosis and emphysema (ICD10-J43.9). Hepatic steatosis  Assessment and Plan    Obstructive Sleep Apnea CPAP provides good control with <5 apnea events/hour. Discussed benefits of AutoPAP for self-adjustment. Open to CPAP replacement with [employer] if insurance eligible. - Discuss CPAP machine replacement with [employer]. - Provide written prescription for CPAP supplies. - Consider switching to AutoPAP with a range of 10-20 cm H2O.  Bronchitis with Hemoptysis Severe bronchitis episode with suspected pneumonia. Hemoptysis noted. CT showed right lung shadow, likely inflammation. Treated with prednisone  and antibiotics. Symptoms resolved. Agreed with radiologist's assessment. - Order repeat low-dose chest CT in three months.  Chronic Back Pain Ongoing back pain, likely muscular. X-rays show degenerative changes, normal alignment. - Monitor back pain and use common sense for management. -  Contact doctor if symptoms do not improve.  Insomnia Uses zaleplon  effectively without morning drowsiness. Concerned about waking up on time. Not interested in refill or new medication. - Monitor sleep patterns and use zaleplon  as needed. - Contact doctor if a refill or change in medication is desired.        ROS- see HPI   + sign = positive Constitutional:   No-   weight loss, night sweats,  fevers, chills,  fatigue, lassitude. HEENT:   No-  headaches, difficulty swallowing, tooth/dental problems, sore throat,       No-  sneezing, itching, ear ache, nasal congestion, post nasal drip,  CV:  No-   chest pain, orthopnea, PND, swelling in lower extremities, anasarca, dizziness, palpitations Resp: + shortness of breath with exertion or at rest.              No-   productive cough,  No non-productive cough,  No-  coughing up of blood.              No-   change in color of mucus.  No- wheezing.   Skin: No-   rash or lesions. GI:  No-   heartburn, indigestion, abdominal pain, nausea, vomiting,  GU:  MS:  No-   joint pain or swelling.  . Neuro- nothing unusual  Psych:  No- change in mood or affect. No depression or anxiety.  No memory loss.  OBJ- General- Alert, Oriented, Affect-appropriate, calm, Distress- none acute,  + obese Skin- rash-none, lesions- none, excoriation- none Lymphadenopathy- none Head- atraumatic            Eyes- Gross vision intact, PERRLA, conjunctivae clear secretions            Ears- Hearing, canals-normal            Nose- Clear, no-Septal dev, mucus, polyps, erosion, perforation             Throat- Mallampati III , mucosa clear , drainage- none, tonsils- atrophic Neck- flexible , trachea midline, no stridor , thyroid nl, carotid no bruit Chest - symmetrical excursion , unlabored           Heart/CV- RRR , + murmur  1/6S, no gallop  , no rub, nl s1 s2                           - JVD- none , edema- none, stasis changes- none, varices- none           Lung- clear to P&A, wheeze- none, cough- none , dullness-none, rub- none           Chest wall-  Abd-  Br/ Gen/ Rectal- Not done, not indicated Extrem- cyanosis- none, clubbing, none, atrophy- none, strength- nl Neuro- grossly intact to observation

## 2023-10-02 NOTE — Telephone Encounter (Signed)
 Called and spoke to pt. Informed him of the recommendations to have 2 month follow up scan to assure infection has resolved. Patient states he is feeling much better now and denies any complaints. Order placed for 2 month follow up, scheduled scan with patient for 10/27/2023 at [facility].

## 2023-10-02 NOTE — Telephone Encounter (Signed)
 Results have been reviewed by Dara Ear NP. There has been partial resolution of infection since being treated on 08/08/2023. Patient will need a 2 month follow up scan to assure complete infection resolution. Patient's follow up scan is due 10/26/2023. Will need to inform patient and send results and recs to PCP.

## 2023-10-02 NOTE — Addendum Note (Signed)
 Addended by: Veryl Gottron on: 10/02/2023 10:18 AM   Modules accepted: Orders

## 2023-10-07 ENCOUNTER — Other Ambulatory Visit: Payer: Self-pay | Admitting: Family Medicine

## 2023-10-12 ENCOUNTER — Ambulatory Visit: Admitting: Family Medicine

## 2023-10-12 ENCOUNTER — Encounter: Payer: Self-pay | Admitting: Family Medicine

## 2023-10-12 VITALS — BP 124/68 | HR 82 | Temp 98.2°F | Ht 71.0 in | Wt 245.2 lb

## 2023-10-12 DIAGNOSIS — R809 Proteinuria, unspecified: Secondary | ICD-10-CM

## 2023-10-12 DIAGNOSIS — E1129 Type 2 diabetes mellitus with other diabetic kidney complication: Secondary | ICD-10-CM | POA: Diagnosis not present

## 2023-10-12 DIAGNOSIS — E785 Hyperlipidemia, unspecified: Secondary | ICD-10-CM

## 2023-10-12 DIAGNOSIS — M549 Dorsalgia, unspecified: Secondary | ICD-10-CM

## 2023-10-12 DIAGNOSIS — E119 Type 2 diabetes mellitus without complications: Secondary | ICD-10-CM

## 2023-10-12 DIAGNOSIS — Z7984 Long term (current) use of oral hypoglycemic drugs: Secondary | ICD-10-CM | POA: Diagnosis not present

## 2023-10-12 LAB — POCT GLYCOSYLATED HEMOGLOBIN (HGB A1C): Hemoglobin A1C: 6.3 % — AB (ref 4.0–5.6)

## 2023-10-12 MED ORDER — TIZANIDINE HCL 2 MG PO TABS: 2.0000 mg | ORAL_TABLET | Freq: Three times a day (TID) | ORAL | 0 refills | Status: DC | PRN

## 2023-10-12 NOTE — Progress Notes (Signed)
 Diabetes:  Using medications without difficulties:no Hypoglycemic episodes:no Hyperglycemic episodes:no Feet problems: some occ tingling.   Blood Sugars averaging: 100-120s.   eye exam within last year: yes A1c 6.3, d/w pt at OV.  80 units insulin  0.5mg  ozempic .  1000mg  BID metformin .  Prev MALB d/w pt.  No change in plan based on results.    He restarted atorvastatin , in the AM.  He may tolerate that better with less cramping than PM dosing.    Back pain. Ongoing for about a month, lower back around the right hip. But it will travel to the other side and sometime down the left leg.  Some better recently.    He has f/u CT pending, d/w pt about prev lung cancer screening results.    Meds, vitals, and allergies reviewed.   ROS: Per HPI unless specifically indicated in ROS section   GEN: nad, alert and oriented HEENT: ncat NECK: supple w/o LA CV: rrr. PULM: ctab, no inc wob ABD: soft, +bs EXT: no edema SKIN: well perfused.  SI testing and SLR neg B.

## 2023-10-12 NOTE — Patient Instructions (Addendum)
 If you want to try going up to 1mg  ozempic , then update us  when you get to the last month of your current supply.  Take care.  Glad to see you. Recheck at a yearly visit in the fall, labs ahead of time.  Try tizanidine  and let me know if that isn't helping.  Keep stretching.

## 2023-10-14 ENCOUNTER — Encounter: Payer: Self-pay | Admitting: Internal Medicine

## 2023-10-15 NOTE — Assessment & Plan Note (Signed)
 A1c 6.3, d/w pt at OV.  80 units insulin  0.5mg  ozempic .  1000mg  BID metformin .  Prev MALB d/w pt.  No change in plan based on results.   He could try increasing his Ozempic  dose. If he wants to try going up to 1mg  ozempic , then he can update us  when he gets to the last month of his current supply.  He may be able to decrease his insulin  use with a higher Ozempic  dose. Recheck periodically.

## 2023-10-15 NOTE — Assessment & Plan Note (Signed)
 Discussed options.  Okay for outpatient follow-up. He can try tizanidine  and let me know if that isn't helping.  Keep stretching.

## 2023-10-15 NOTE — Assessment & Plan Note (Signed)
 He restarted atorvastatin , in the AM.  He may tolerate that better with less cramping than PM dosing.   Continue as is.

## 2023-10-19 ENCOUNTER — Other Ambulatory Visit: Payer: Self-pay | Admitting: Family Medicine

## 2023-10-20 NOTE — Telephone Encounter (Signed)
 LOV:10/12/23 NOV: NOTHING SCHEDULED LAST REFILL: TIZANIDINE  HYDROCHLORIDE 2MG  TABLET 10/12/23 30 TABLETS 0 REFILL

## 2023-10-26 ENCOUNTER — Encounter: Payer: Self-pay | Admitting: Acute Care

## 2023-10-27 ENCOUNTER — Other Ambulatory Visit

## 2023-10-30 ENCOUNTER — Ambulatory Visit
Admission: RE | Admit: 2023-10-30 | Discharge: 2023-10-30 | Disposition: A | Discharge status: Home / Self Care | Source: Ambulatory Visit | Attending: Acute Care | Admitting: Acute Care

## 2023-10-30 DIAGNOSIS — R911 Solitary pulmonary nodule: Secondary | ICD-10-CM

## 2023-10-31 ENCOUNTER — Other Ambulatory Visit: Payer: Self-pay | Admitting: Family Medicine

## 2023-11-17 ENCOUNTER — Encounter: Payer: Self-pay | Admitting: Internal Medicine

## 2023-11-23 ENCOUNTER — Other Ambulatory Visit: Payer: Self-pay

## 2023-11-23 DIAGNOSIS — Z87891 Personal history of nicotine dependence: Secondary | ICD-10-CM

## 2023-11-23 DIAGNOSIS — Z122 Encounter for screening for malignant neoplasm of respiratory organs: Secondary | ICD-10-CM

## 2023-11-23 DIAGNOSIS — F17210 Nicotine dependence, cigarettes, uncomplicated: Secondary | ICD-10-CM

## 2023-12-18 ENCOUNTER — Other Ambulatory Visit: Payer: Self-pay | Admitting: Family Medicine

## 2023-12-20 ENCOUNTER — Other Ambulatory Visit: Payer: Self-pay | Admitting: Family Medicine

## 2024-03-14 ENCOUNTER — Other Ambulatory Visit: Payer: Self-pay | Admitting: Family Medicine

## 2024-04-11 ENCOUNTER — Other Ambulatory Visit: Payer: Self-pay | Admitting: Family Medicine

## 2024-04-11 DIAGNOSIS — R809 Proteinuria, unspecified: Secondary | ICD-10-CM

## 2024-04-11 NOTE — Telephone Encounter (Signed)
 E-scribed refill.  Pls schedule CPE and fasting labs for additional refills.

## 2024-04-12 NOTE — Telephone Encounter (Signed)
 Lvm for patient to schedule cpe and fasting labs

## 2024-05-02 ENCOUNTER — Other Ambulatory Visit: Payer: Self-pay | Admitting: Family Medicine

## 2024-05-02 DIAGNOSIS — Z125 Encounter for screening for malignant neoplasm of prostate: Secondary | ICD-10-CM

## 2024-05-02 DIAGNOSIS — E1129 Type 2 diabetes mellitus with other diabetic kidney complication: Secondary | ICD-10-CM

## 2024-05-13 ENCOUNTER — Other Ambulatory Visit (INDEPENDENT_AMBULATORY_CARE_PROVIDER_SITE_OTHER)

## 2024-05-13 DIAGNOSIS — R809 Proteinuria, unspecified: Secondary | ICD-10-CM | POA: Diagnosis not present

## 2024-05-13 DIAGNOSIS — E1129 Type 2 diabetes mellitus with other diabetic kidney complication: Secondary | ICD-10-CM | POA: Diagnosis not present

## 2024-05-13 DIAGNOSIS — Z125 Encounter for screening for malignant neoplasm of prostate: Secondary | ICD-10-CM | POA: Diagnosis not present

## 2024-05-13 LAB — COMPREHENSIVE METABOLIC PANEL WITH GFR
ALT: 29 U/L (ref 0–53)
AST: 27 U/L (ref 0–37)
Albumin: 4.5 g/dL (ref 3.5–5.2)
Alkaline Phosphatase: 62 U/L (ref 39–117)
BUN: 20 mg/dL (ref 6–23)
CO2: 26 meq/L (ref 19–32)
Calcium: 9.5 mg/dL (ref 8.4–10.5)
Chloride: 103 meq/L (ref 96–112)
Creatinine, Ser: 1.13 mg/dL (ref 0.40–1.50)
GFR: 68.31 mL/min (ref >60.00)
Glucose, Bld: 168 mg/dL — ABNORMAL HIGH (ref 70–99)
Potassium: 3.9 meq/L (ref 3.5–5.1)
Sodium: 138 meq/L (ref 135–145)
Total Bilirubin: 0.7 mg/dL (ref 0.2–1.2)
Total Protein: 7 g/dL (ref 6.0–8.3)

## 2024-05-13 LAB — LIPID PANEL
Cholesterol: 131 mg/dL (ref 0–200)
HDL: 27 mg/dL — ABNORMAL LOW (ref >39.00)
LDL Cholesterol: 81 mg/dL (ref 0–99)
NonHDL: 104.47
Total CHOL/HDL Ratio: 5
Triglycerides: 116 mg/dL (ref 0.0–149.0)
VLDL: 23.2 mg/dL (ref 0.0–40.0)

## 2024-05-13 LAB — HEMOGLOBIN A1C: Hgb A1c MFr Bld: 6.4 % (ref 4.6–6.5)

## 2024-05-13 LAB — MICROALBUMIN / CREATININE URINE RATIO
Creatinine,U: 273.5 mg/dL
Microalb Creat Ratio: 13.1 mg/g (ref 0.0–30.0)
Microalb, Ur: 3.6 mg/dL — ABNORMAL HIGH (ref 0.0–1.9)

## 2024-05-13 LAB — PSA: PSA: 0.51 ng/mL (ref 0.10–4.00)

## 2024-05-19 ENCOUNTER — Ambulatory Visit: Payer: Self-pay | Admitting: Family Medicine

## 2024-05-20 ENCOUNTER — Ambulatory Visit: Admitting: Family Medicine

## 2024-05-20 VITALS — BP 128/70 | HR 85 | Temp 98.7°F | Ht 70.5 in | Wt 238.5 lb

## 2024-05-20 DIAGNOSIS — Z23 Encounter for immunization: Secondary | ICD-10-CM

## 2024-05-20 DIAGNOSIS — Z91018 Allergy to other foods: Secondary | ICD-10-CM

## 2024-05-20 DIAGNOSIS — M549 Dorsalgia, unspecified: Secondary | ICD-10-CM

## 2024-05-20 DIAGNOSIS — G4733 Obstructive sleep apnea (adult) (pediatric): Secondary | ICD-10-CM

## 2024-05-20 DIAGNOSIS — Z7189 Other specified counseling: Secondary | ICD-10-CM

## 2024-05-20 DIAGNOSIS — E785 Hyperlipidemia, unspecified: Secondary | ICD-10-CM

## 2024-05-20 DIAGNOSIS — E1129 Type 2 diabetes mellitus with other diabetic kidney complication: Secondary | ICD-10-CM

## 2024-05-20 DIAGNOSIS — Z0000 Encounter for general adult medical examination without abnormal findings: Secondary | ICD-10-CM

## 2024-05-20 MED ORDER — BASAGLAR KWIKPEN 100 UNIT/ML ~~LOC~~ SOPN: 80.0000 [IU] | PEN_INJECTOR | Freq: Every day | SUBCUTANEOUS | Status: AC

## 2024-05-20 MED ORDER — OZEMPIC (0.25 OR 0.5 MG/DOSE) 2 MG/3ML ~~LOC~~ SOPN: 0.5000 mg | PEN_INJECTOR | SUBCUTANEOUS | 3 refills | Status: AC

## 2024-05-20 NOTE — Patient Instructions (Addendum)
 [name],  Thank you for taking the time for your Medicare Wellness Visit. I appreciate your continued commitment to your health goals. Please review the care plan we discussed, and feel free to reach out if I can assist you further.  Please note that Annual Wellness Visits do not include a physical exam. Some assessments may be limited, especially if the visit was conducted virtually. If needed, we may recommend an in-person follow-up with your provider.  Ongoing Care Seeing your primary care provider every 3 to 6 months helps us  monitor your health and provide consistent, personalized care.   Referrals If a referral was made during today's visit and you haven't received any updates within two weeks, please contact the referred provider directly to check on the status.  Recommended Screenings:  Health Maintenance  Topic Date Due   Pneumococcal Vaccine for age over 87 (2 of 2 - PCV) 05/30/2014   COVID-19 Vaccine (3 - Pfizer risk series) 04/10/2020   Flu Shot  01/19/2024   Complete foot exam   04/05/2024   Eye exam for diabetics  05/25/2024   Hemoglobin A1C  11/10/2024   Yearly kidney function blood test for diabetes  05/13/2025   Yearly kidney health urinalysis for diabetes  05/13/2025   Colon Cancer Screening  12/11/2026   DTaP/Tdap/Td vaccine (4 - Td or Tdap) 10/02/2030   Hepatitis C Screening  Completed   HIV Screening  Completed   Zoster (Shingles) Vaccine  Completed   Hepatitis B Vaccine  Aged Out   Meningitis B Vaccine  Aged Out       05/20/2024    3:48 PM  Advanced Directives  Does Patient Have a Medical Advance Directive? No  Would patient like information on creating a medical advance directive? No - Patient declined    Vision: Annual vision screenings are recommended for early detection of glaucoma, cataracts, and diabetic retinopathy. These exams can also reveal signs of chronic conditions such as diabetes and high blood pressure.  Dental: Annual dental  screenings help detect early signs of oral cancer, gum disease, and other conditions linked to overall health, including heart disease and diabetes.  Please see the attached documents for additional preventive care recommendations.    ============================ Skip lipitor for about 10 days.  Either way, let me know about the cramps.   Recheck in about 6 months.  A1c at the visit.  Update me as needed . Take care.  Glad to see you.

## 2024-05-20 NOTE — Progress Notes (Signed)
 Chief Complaint  Patient presents with   Annual Exam    Welcome to Medicare      Subjective:   Edward Wright is a 63 y.o. male who presents for a Welcome to Medicare Exam.   Visit info / Clinical Intake: Medicare Wellness Visit Type:: Welcome to [employer] (IPPE) Persons participating in visit and providing information:: patient Medicare Wellness Visit Mode:: In-person (required for WTM) Interpreter Needed?: No Pre-visit prep was completed: yes AWV questionnaire completed by patient prior to visit?: no Living arrangements:: lives with spouse/significant other Patient's Overall Health Status Rating: good Typical amount of pain: some Does pain affect daily life?: (!) yes Are you currently prescribed opioids?: no  Dietary Habits and Nutritional Risks How many meals a day?: 2 Eats fruit and vegetables daily?: yes Most meals are obtained by: preparing own meals In the last 2 weeks, have you had any of the following?: none Diabetic:: (!) yes Any non-healing wounds?: no How often do you check your BS?: 1 Would you like to be referred to a Nutritionist or for Diabetic Management? : no  Functional Status Activities of Daily Living (to include ambulation/medication): Independent Ambulation: Independent Medication Administration: Independent Home Management (perform basic housework or laundry): Independent Manage your own finances?: yes Primary transportation is: driving Concerns about vision?: no *vision screening is required for WTM* Concerns about hearing?: no  Fall Screening Falls in the past year?: 0 Number of falls in past year: 0 Was there an injury with Fall?: 0 Fall Risk Category Calculator: 0 Patient Fall Risk Level: Low Fall Risk  Fall Risk Patient at Risk for Falls Due to: No Fall Risks Fall risk Follow up: Falls evaluation completed  Home and Transportation Safety: All rugs have non-skid backing?: yes All stairs or steps have railings?: yes Grab bars in  the bathtub or shower?: (!) no Have non-skid surface in bathtub or shower?: yes Good home lighting?: yes Regular seat belt use?: yes Hospital stays in the last year:: no  Cognitive Assessment Difficulty concentrating, remembering, or making decisions? : no Will 6CIT or Mini Cog be Completed: yes What year is it?: 0 points What month is it?: 0 points Give patient an address phrase to remember (5 components): [place] About what time is it?: 0 points Count backwards from 20 to 1: 0 points Say the months of the year in reverse: 0 points  Advance Directives (For Healthcare) Does Patient Have a Medical Advance Directive?: No Would patient like information on creating a medical advance directive?: No - Patient declined  Reviewed/Updated  Reviewed/Updated: Reviewed All (Medical, Surgical, Family, Medications, Allergies, Care Teams, Patient Goals); Medical History; Surgical History; Family History; Medications; Allergies; Care Teams; Patient Goals    Allergies (verified) Atorvastatin , Hydrocodone, Jardiance  [empagliflozin ], and Meat [alpha-gal]   Current Medications (verified) Outpatient Encounter Medications as of 05/20/2024  Medication Sig   atorvastatin  (LIPITOR) 40 MG tablet Take 1 tablet (40 mg total) by mouth daily.   Blood Glucose Monitoring Suppl (CONTOUR NEXT ONE) DEVI 1 Device by Does not apply route as needed. Use to check blood sugar twice daily and as directed.  Insulin  Dependent.  Dx:  E11.9   clotrimazole  (LOTRIMIN ) 1 % cream APPLY TOPICALLY 2 (TWO) TIMES DAILY.AS NEEDED   diazepam  (VALIUM ) 5 MG tablet Take 1 tablet (5 mg total) by mouth 3 (three) times daily as needed.   glucose blood (CONTOUR NEXT TEST) test strip USE AS INSTRUCTED TO TEST TWICE DAILY   GNP ULTICARE  PEN NEEDLES 31G X 5 MM MISC USE DAILY WITH INSULIN  PENS   hydrocortisone  (ANUSOL -HC) 25 MG suppository UNWRAP AND PLACE 1 SUPPOSITORY (25 MG TOTAL) RECTALLY 3 (THREE) TIMES DAILY AS NEEDED.    Insulin  Glargine (BASAGLAR  KWIKPEN) 100 UNIT/ML Inject 80 Units into the skin daily.   lisinopril  (ZESTRIL ) 2.5 MG tablet TAKE ONE TABLET BY MOUTH ONCE A DAY   meclizine  (ANTIVERT ) 25 MG tablet TAKE 1 TABLET BY MOUTH 3 TIMES DAILY AS NEEDED FOR DIZZINESS.   metFORMIN  (GLUCOPHAGE ) 500 MG tablet TAKE TWO TABLETS BY MOUTH TWICE A DAY WITH A MEAL   NON FORMULARY CPAP 13 Advanced, use as directed   omeprazole  (PRILOSEC) 40 MG capsule TAKE ONE CAPSULE BY MOUTH ONCE DAILY   tiZANidine  (ZANAFLEX ) 2 MG tablet TAKE ONE TABLET (2 MG TOTAL) BY MOUTH EVERY EIGHT (EIGHT) HOURS AS NEEDED FOR MUSCLE SPASMS (SEDATION CAUTION.).   traMADol  (ULTRAM ) 50 MG tablet Take 1 tablet (50 mg total) by mouth daily as needed.   zaleplon  (SONATA ) 5 MG capsule Take 1 capsule (5 mg total) by mouth at bedtime as needed for sleep.   [DISCONTINUED] Insulin  Glargine (BASAGLAR  KWIKPEN) 100 UNIT/ML INJECT UP TO 100 UNITS INTO THE SKIN ONCE DAILY   [DISCONTINUED] Semaglutide ,0.25 or 0.5MG /DOS, (OZEMPIC , 0.25 OR 0.5 MG/DOSE,) 2 MG/3ML SOPN Inject 0.5 mg as directed once a week.   Semaglutide ,0.25 or 0.5MG /DOS, (OZEMPIC , 0.25 OR 0.5 MG/DOSE,) 2 MG/3ML SOPN Inject 0.5 mg as directed once a week.   No facility-administered encounter medications on file as of 05/20/2024.    History: Past Medical History:  Diagnosis Date   Allergy    Arthritis    Bell's palsy    L sided with mild persistent lip droop   BPH (benign prostatic hyperplasia) 07/21/1997   Colon polyps    adenomatous   Diabetes mellitus type II 01/19/2003   Diverticulosis    GERD (gastroesophageal reflux disease) 11/19/2002   Heart murmur    history of   Hyperlipemia 04/20/1989   Internal hemorrhoids    Peptic stricture of esophagus    Sleep apnea    sleep study pos on CPAP   Spermatocele    Past Surgical History:  Procedure Laterality Date   CHOLECYSTECTOMY  01/05/2004   COLONOSCOPY     CYSTECTOMY Right 06/20/1988   ganglion wrist   DOPPLER ECHOCARDIOGRAPHY   01/13/2004   normal   hematuria  12/19/1994   w/u neg (urol)   UPPER GASTROINTESTINAL ENDOSCOPY     VASECTOMY  05/26/2006   Family History  Problem Relation Age of Onset   Hypertension Mother    Heart disease Mother        CAD, MI, stents x 2-3   Transient ischemic attack Mother    Dementia Mother    Varicose Veins Mother    Cancer Father        multiple myeloma   Asthma Father    Diabetes Paternal Grandfather    Cancer Other        unknown type   Diabetes Maternal Grandfather    Colon cancer Neg Hx    Prostate cancer Neg Hx    Esophageal cancer Neg Hx    Rectal cancer Neg Hx    Stomach cancer Neg Hx    Social History   Occupational History   Occupation: A/C Biochemist, Clinical: [employer]    Comment: retail buyer, estimator  Tobacco Use   Smoking status: Former    Current packs/day: 0.00  Average packs/day: 1 pack/day for 10.0 years (10.0 ttl pk-yrs)    Types: Cigarettes    Start date: 03/13/2009    Quit date: 03/14/2019    Years since quitting: 5.2   Smokeless tobacco: Never  Vaping Use   Vaping status: Never Used  Substance and Sexual Activity   Alcohol use: Yes    Comment: 1 beer a month   Drug use: Never   Sexual activity: Yes    Birth control/protection: None   Tobacco Counseling Counseling given: Not Answered  SDOH Screenings   Food Insecurity: No Food Insecurity (05/20/2024)  Housing: Low Risk  (05/20/2024)  Transportation Needs: No Transportation Needs (05/20/2024)  Utilities: Not At Risk (05/20/2024)  Alcohol Screen: Low Risk  (05/20/2024)  Depression (PHQ2-9): Low Risk  (05/20/2024)  Financial Resource Strain: Low Risk  (05/20/2024)  Physical Activity: Insufficiently Active (05/20/2024)  Social Connections: Moderately Isolated (05/20/2024)  Stress: No Stress Concern Present (05/20/2024)  Tobacco Use: Medium Risk (05/20/2024)  Health Literacy: Adequate Health Literacy (05/20/2024)   See flowsheets for full screening details  Depression  Screen PHQ 2 & 9 Depression Scale- Over the past 2 weeks, how often have you been bothered by any of the following problems? Little interest or pleasure in doing things: 0 Feeling down, depressed, or hopeless (PHQ Adolescent also includes...irritable): 0 PHQ-2 Total Score: 0 Trouble falling or staying asleep, or sleeping too much: 0 Feeling tired or having little energy: 3 Poor appetite or overeating (PHQ Adolescent also includes...weight loss): 0 Feeling bad about yourself - or that you are a failure or have let yourself or your family down: 0 Trouble concentrating on things, such as reading the newspaper or watching television (PHQ Adolescent also includes...like school work): 0 Moving or speaking so slowly that other people could have noticed. Or the opposite - being so fidgety or restless that you have been moving around a lot more than usual: 0 Thoughts that you would be better off dead, or of hurting yourself in some way: 0 PHQ-9 Total Score: 3 If you checked off any problems, how difficult have these problems made it for you to do your work, take care of things at home, or get along with other people?: Not difficult at all  Depression Treatment Depression Interventions/Treatment : Counseling      Goals Addressed             This Visit's Progress    Increase physical activity               Objective:    Today's Vitals   05/20/24 1559  BP: 128/70  Pulse: 85  Temp: 98.7 F (37.1 C)  TempSrc: Oral  SpO2: 97%  Weight: 238 lb 8 oz (108.2 kg)  Height: 5' 10.5 (1.791 m)   Body mass index is 33.74 kg/m.      Hearing/Vision screen Hearing Screening   250Hz  500Hz  1000Hz  2000Hz  3000Hz  4000Hz  6000Hz  8000Hz   Right ear 20 20 20 20 20 20 20 20   Left ear 20 20 20 20 20 20 20 20    Vision Screening   Right eye Left eye Both eyes  Without correction     With correction 20/15 20/20 20/15    Immunizations and Health Maintenance Health Maintenance  Topic Date Due    Pneumococcal Vaccine: 50+ Years (2 of 2 - PCV) 05/30/2014   COVID-19 Vaccine (3 - Pfizer risk series) 04/10/2020   OPHTHALMOLOGY EXAM  05/25/2024   HEMOGLOBIN A1C  11/10/2024   Diabetic kidney evaluation -  eGFR measurement  05/13/2025   Diabetic kidney evaluation - Urine ACR  05/13/2025   FOOT EXAM  05/20/2025   Colonoscopy  12/11/2026   DTaP/Tdap/Td (4 - Td or Tdap) 10/02/2030   Influenza Vaccine  Completed   Hepatitis C Screening  Completed   HIV Screening  Completed   Zoster Vaccines- Shingrix   Completed   Hepatitis B Vaccines 19-59 Average Risk  Aged Out   Meningococcal B Vaccine  Aged Out    EKG: normal EKG, normal sinus rhythm, unchanged from previous tracings     Assessment/Plan:  This is a routine wellness examination for Elsie.  Patient Care Team: Cleatus Arlyss RAMAN, MD as PCP - General (Family Medicine) Dann Candyce RAMAN, MD as PCP - Cardiology (Cardiology) Sharron Cordella ORN, OD as Consulting Physician (Optometry)  I have personally reviewed and noted the following in the patient's chart:   Medical and social history Use of alcohol, tobacco or illicit drugs  Current medications and supplements including opioid prescriptions. Functional ability and status Nutritional status Physical activity Advanced directives List of other physicians Hospitalizations, surgeries, and ER visits in previous 12 months Vitals Screenings to include cognitive, depression, and falls Referrals and appointments  Orders Placed This Encounter  Procedures   Flu vaccine trivalent PF, 6mos and older(Flulaval,Afluria,Fluarix,Fluzone)   In addition, I have reviewed and discussed with patient certain preventive protocols, quality metrics, and best practice recommendations. A written personalized care plan for preventive services as well as general preventive health recommendations were provided to patient.   Mont Jagoda Y Carliyah Cotterman, CMA   05/28/2024   Return in 1 year (on 05/20/2025).    =========================== Diabetes:  Using medications without difficulties: yes Hypoglycemic episodes:no Hyperglycemic episodes:no Feet problems: occ change in sensation.  Not consistent.  Normal sensation at OV.  Blood Sugars averaging: usually ~115.   eye exam within last year: yes Labs d/w pt.    Elevated Cholesterol: Using medications without problems: yes Muscle aches:  some cramping, improved with pickle juice.  Unclear if statin related.   Diet compliance:  d/w pt.  Exercise: d/w pt.    Back pain with sciatica.  Taking tramadol  prn for that, with relief.  Tizanidine  helped less than tramadol .  Not sedated.  H/o alpha gal but still able to eat mammalian meat now.  D/w pt about routine cautions.  No lip or tongue swelling.     Still using CPAP.  Insomnia hx noted with rare use of sonata .  He needed refill since his rx was out of date.    Tetanus 2022 Flu 2025 PNA 2014 Shingles prev done covid vaccine prev done Colon cancer screening- colonoscopy 2023 PSA 2025 Living will d/w pt. Would have his wife designated if patient were incapacitated.     Recent eye watering.  Had eye clinic eval, no change in sx with initial rx.  Then allergy drops helped.  He was concerned about pet dander allergy.  D/w pt about options.  Rhinorrhea.  No fevers.  He is managing as is.  I asked him to update me as needed.  D/w pt about trying claritin if needed.   PMH and SH reviewed  Meds, vitals, and allergies reviewed.   ROS: Per HPI unless specifically indicated in ROS section   GEN: nad, alert and oriented HEENT: mucous membranes moist NECK: supple w/o LA CV: rrr. PULM: ctab, no inc wob ABD: soft, +bs EXT: no edema SKIN: no acute rash  Diabetic foot exam: Normal inspection No skin breakdown No calluses  Normal DP pulses Normal sensation to light touch and monofilament Nails normal

## 2024-05-22 DIAGNOSIS — Z0000 Encounter for general adult medical examination without abnormal findings: Secondary | ICD-10-CM | POA: Insufficient documentation

## 2024-05-22 NOTE — Assessment & Plan Note (Signed)
 Still using CPAP.  Insomnia hx noted with rare use of sonata .  He needed refill since his rx was out of date.   Would continue as is.

## 2024-05-22 NOTE — Assessment & Plan Note (Signed)
 Continue work on diet and exercise.  Continue Ozempic  metformin  lisinopril  insulin .  Labs discussed with patient.  No change in meds today. Recheck in about 6 months.  A1c at the visit.  Update me as needed

## 2024-05-22 NOTE — Assessment & Plan Note (Signed)
 With episodic flare.  Not having weakness.  Okay to use tramadol  as needed.  He can update me as needed.

## 2024-05-22 NOTE — Assessment & Plan Note (Signed)
Living will d/w pt.   Would have his wife designated if patient were incapacitated.  

## 2024-05-22 NOTE — Assessment & Plan Note (Signed)
 Tetanus 2022 Flu 2025 PNA 2014 Shingles prev done covid vaccine prev done Colon cancer screening- colonoscopy 2023 PSA 2025 Living will d/w pt. Would have his wife designated if patient were incapacitated.

## 2024-05-22 NOTE — Assessment & Plan Note (Signed)
 D/w pt about routine cautions.  No lip or tongue swelling.

## 2024-05-22 NOTE — Assessment & Plan Note (Signed)
 Continue work on diet and exercise.  Skip lipitor for about 10 days.  Either way, let me know about the cramps.

## 2024-05-30 ENCOUNTER — Other Ambulatory Visit: Payer: Self-pay | Admitting: Family Medicine

## 2024-06-10 ENCOUNTER — Telehealth: Payer: Self-pay

## 2024-06-10 ENCOUNTER — Other Ambulatory Visit: Payer: Self-pay | Admitting: Family Medicine

## 2024-06-10 NOTE — Telephone Encounter (Signed)
 Pt needs follow up appointment in 6 months. Please advise

## 2024-06-11 NOTE — Telephone Encounter (Signed)
 Patient scheduled for 6.26.25, 6 mos f/u

## 2024-07-04 ENCOUNTER — Other Ambulatory Visit: Payer: Self-pay | Admitting: Family Medicine

## 2024-07-04 DIAGNOSIS — R809 Proteinuria, unspecified: Secondary | ICD-10-CM

## 2024-07-12 ENCOUNTER — Other Ambulatory Visit: Payer: Self-pay | Admitting: Family Medicine

## 2024-09-30 ENCOUNTER — Ambulatory Visit: Admitting: Pulmonary Disease

## 2024-12-13 ENCOUNTER — Ambulatory Visit: Admitting: Family Medicine
